# Patient Record
Sex: Male | Born: 1945 | Race: Black or African American | Hispanic: No | Marital: Married | Smoking: Former smoker
Health system: Southern US, Community
[De-identification: ages and names within clinical notes are randomized; demographics above are authoritative.]

## PROBLEM LIST (undated history)

## (undated) DIAGNOSIS — M1990 Unspecified osteoarthritis, unspecified site: Secondary | ICD-10-CM

## (undated) DIAGNOSIS — E113299 Type 2 diabetes mellitus with mild nonproliferative diabetic retinopathy without macular edema, unspecified eye: Secondary | ICD-10-CM

## (undated) DIAGNOSIS — I10 Essential (primary) hypertension: Secondary | ICD-10-CM

## (undated) DIAGNOSIS — E119 Type 2 diabetes mellitus without complications: Secondary | ICD-10-CM

## (undated) DIAGNOSIS — Z9289 Personal history of other medical treatment: Secondary | ICD-10-CM

## (undated) DIAGNOSIS — E7800 Pure hypercholesterolemia, unspecified: Secondary | ICD-10-CM

## (undated) HISTORY — DX: Personal history of other medical treatment: Z92.89

## (undated) HISTORY — PX: CARDIAC CATHETERIZATION: SHX172

## (undated) HISTORY — PX: BACK SURGERY: SHX140

## (undated) HISTORY — PX: CATARACT EXTRACTION: SUR2

## (undated) HISTORY — PX: EYE SURGERY: SHX253

## (undated) HISTORY — PX: WRIST SURGERY: SHX841

## (undated) HISTORY — DX: Type 2 diabetes mellitus with mild nonproliferative diabetic retinopathy without macular edema, unspecified eye: E11.3299

## (undated) HISTORY — PX: LUMBAR DISC SURGERY: SHX700

## (undated) HISTORY — PX: CATARACT EXTRACTION W/ INTRAOCULAR LENS IMPLANT: SHX1309

---

## 1966-08-07 HISTORY — PX: LACERATION REPAIR: SHX5168

## 2016-11-04 ENCOUNTER — Encounter: Payer: Self-pay | Admitting: Family Medicine

## 2017-03-14 ENCOUNTER — Ambulatory Visit (HOSPITAL_COMMUNITY)
Admission: EM | Admit: 2017-03-14 | Discharge: 2017-03-14 | Disposition: A | Discharge status: Home / Self Care | Payer: Medicare Other | Attending: Family Medicine | Admitting: Family Medicine

## 2017-03-14 ENCOUNTER — Encounter (HOSPITAL_COMMUNITY): Payer: Self-pay | Admitting: Emergency Medicine

## 2017-03-14 DIAGNOSIS — L0291 Cutaneous abscess, unspecified: Secondary | ICD-10-CM

## 2017-03-14 HISTORY — DX: Pure hypercholesterolemia, unspecified: E78.00

## 2017-03-14 HISTORY — DX: Essential (primary) hypertension: I10

## 2017-03-14 MED ORDER — SULFAMETHOXAZOLE-TRIMETHOPRIM 800-160 MG PO TABS: 1.0000 | ORAL_TABLET | Freq: Two times a day (BID) | ORAL | 0 refills | Status: AC

## 2017-03-14 MED ORDER — HYDROCODONE-ACETAMINOPHEN 5-325 MG PO TABS: 1.0000 | ORAL_TABLET | ORAL | 0 refills | Status: DC | PRN

## 2017-03-14 NOTE — ED Triage Notes (Signed)
Patient reports an abscess to right, upper back.  No history of this before.  This was noticed a month ago.

## 2017-03-14 NOTE — ED Provider Notes (Signed)
[facility]    CSN: [id number] Arrival date & time: 03/14/17  1847     History   Chief Complaint Chief Complaint  Patient presents with  . Abscess    HPI Caleb Cuevas is a 71 y.o. male.   Patient c/o abscess on his back   The history is provided by the patient and a relative.  Abscess  Abscess location: back. Abscess quality: induration, painful and redness   Red streaking: no   Duration:  3 months Progression:  Worsening Pain details:    Quality:  Throbbing   Severity:  Moderate Chronicity:  Chronic   Past Medical History:  Diagnosis Date  . Diabetes mellitus without complication (HCC)   . High cholesterol   . Hypertension     There are no active problems to display for this patient.   Past Surgical History:  Procedure Laterality Date  . WRIST SURGERY         Home Medications    Prior to Admission medications   Medication Sig Start Date End Date Taking? Authorizing Provider  AMLODIPINE-ATORVASTATIN PO Take by mouth.   Yes [provider]  cholecalciferol (VITAMIN D) 1000 units tablet Take 1,000 Units by mouth daily.   Yes [provider]  insulin aspart (NOVOLOG) 100 UNIT/ML injection Inject into the skin 3 (three) times daily before meals.   Yes [provider]  insulin glargine (LANTUS) 100 UNIT/ML injection Inject into the skin at bedtime.   Yes [provider]  lisinopril-hydrochlorothiazide (PRINZIDE,ZESTORETIC) 20-12.5 MG tablet Take 1 tablet by mouth daily.   Yes [provider]  metFORMIN (GLUCOPHAGE) 1000 MG tablet Take 1,000 mg by mouth 2 (two) times daily with a meal.   Yes [provider]  pravastatin (PRAVACHOL) 40 MG tablet Take 40 mg by mouth daily.   Yes [provider]  HYDROcodone-acetaminophen (NORCO/VICODIN) 5-325 MG tablet Take 1 tablet by mouth every 4 (four) hours as needed. 03/14/17   Deatra Canterxford, Nichael Ehly J, FNP  sulfamethoxazole-trimethoprim (BACTRIM DS,SEPTRA  DS) 800-160 MG tablet Take 1 tablet by mouth 2 (two) times daily. 03/14/17 03/21/17  Deatra Canterxford, Aleric Froelich J, FNP    Family History No family history on file.  Social History Social History  Substance Use Topics  . Smoking status: Never Smoker  . Smokeless tobacco: Not on file  . Alcohol use No     Allergies   Patient has no known allergies.   Review of Systems Review of Systems  Constitutional: Negative.   HENT: Negative.   Eyes: Negative.   Respiratory: Negative.   Cardiovascular: Negative.   Gastrointestinal: Negative.   Endocrine: Negative.   Genitourinary: Negative.   Musculoskeletal: Negative.   Skin: Positive for wound.  Allergic/Immunologic: Negative.   Neurological: Negative.   Hematological: Negative.   Psychiatric/Behavioral: Negative.      Physical Exam Triage Vital Signs ED Triage Vitals  Enc Vitals Group     BP 03/14/17 1920 (!) 154/70     Pulse Rate 03/14/17 1920 86     Resp 03/14/17 1920 20     Temp 03/14/17 1920 98.4 F (36.9 C)     Temp Source 03/14/17 1920 Oral     SpO2 03/14/17 1920 100 %     Weight --      Height --      Head Circumference --      Peak Flow --      Pain Score 03/14/17 1913 8     Pain Loc --  Pain Edu? --      Excl. in GC? --    No data found.   Updated Vital Signs BP (!) 154/70 (BP Location: Left Arm)   Pulse 86   Temp 98.4 F (36.9 C) (Oral)   Resp 20   SpO2 100%   Visual Acuity Right Eye Distance:   Left Eye Distance:   Bilateral Distance:    Right Eye Near:   Left Eye Near:    Bilateral Near:     Physical Exam  Constitutional: He appears well-developed and well-nourished.  HENT:  Head: Normocephalic and atraumatic.  Eyes: Pupils are equal, round, and reactive to light. Conjunctivae and EOM are normal.  Neck: Normal range of motion. Neck supple.  Cardiovascular: Normal rate, regular rhythm and normal heart sounds.   Pulmonary/Chest: Effort normal and breath sounds normal.  Abdominal: Soft. Bowel  sounds are normal.  Skin:  6 cm abscess on back  Nursing note and vitals reviewed.    UC Treatments / Results  Labs (all labs ordered are listed, but only abnormal results are displayed) Labs Reviewed - No data to display  EKG  EKG Interpretation None       Radiology No results found.  Procedures .Marland KitchenIncision and Drainage Date/Time: 03/14/2017 8:15 PM Performed by: Deatra Canter Authorized by: Mardella Layman   Consent:    Consent obtained:  Verbal   Consent given by:  Patient   Risks discussed:  Bleeding, incomplete drainage, pain and infection   Alternatives discussed:  No treatment Location:    Type:  Abscess   Size:  6 cm   Location: back. Pre-procedure details:    Skin preparation:  Betadine Anesthesia (see MAR for exact dosages):    Anesthesia method:  Local infiltration   Local anesthetic:  Lidocaine 1% w/o epi Procedure type:    Complexity:  Simple Procedure details:    Needle aspiration: no     Incision types:  Stab incision   Incision depth:  Subcutaneous   Scalpel blade:  11   Wound management:  Probed and deloculated   Drainage:  Bloody and purulent   Drainage amount:  Copious   Wound treatment:  Wound left open   Packing materials:  1/2 in iodoform gauze   Amount 1/2" iodoform:  24 cm Post-procedure details:    Patient tolerance of procedure:  Tolerated well, no immediate complications Comments:     Bulky 4x4 dressing applied.   (including critical care time)  Medications Ordered in UC Medications - No data to display   Initial Impression / Assessment and Plan / UC Course  I have reviewed the triage vital signs and the nursing notes.  Pertinent labs & imaging results that were available during my care of the patient were reviewed by me and considered in my medical decision making (see chart for details).    Bactrim DS one po bid x 10 days #20 Norco 5/325 one po q 6 hours prn pain #8  Follow up in 2 days for wound  check.    Final Clinical Impressions(s) / UC Diagnoses   Final diagnoses:  Abscess    New Prescriptions Discharge Medication List as of 03/14/2017  8:04 PM    START taking these medications   Details  HYDROcodone-acetaminophen (NORCO/VICODIN) 5-325 MG tablet Take 1 tablet by mouth every 4 (four) hours as needed., Starting Wed 03/14/2017, Print    sulfamethoxazole-trimethoprim (BACTRIM DS,SEPTRA DS) 800-160 MG tablet Take 1 tablet by mouth 2 (two) times daily., Starting  Wed 03/14/2017, Until Wed 03/21/2017, Normal         Controlled Substance Prescriptions [place] Controlled Substance Registry consulted? Yes, I have consulted the [place] Controlled Substances Registry for this patient, and feel the risk/benefit ratio today is favorable for proceeding with this prescription for a controlled substance.   Deatra Canter, [place] 03/14/17 2019

## 2017-03-14 NOTE — Discharge Instructions (Signed)
Follow-up in 2 days for wound check

## 2017-03-16 ENCOUNTER — Encounter (HOSPITAL_COMMUNITY): Payer: Self-pay | Admitting: *Deleted

## 2017-03-16 ENCOUNTER — Ambulatory Visit (HOSPITAL_COMMUNITY)
Admission: EM | Admit: 2017-03-16 | Discharge: 2017-03-16 | Disposition: A | Discharge status: Home / Self Care | Payer: Medicare Other | Attending: Family Medicine | Admitting: Family Medicine

## 2017-03-16 DIAGNOSIS — Z5189 Encounter for other specified aftercare: Secondary | ICD-10-CM

## 2017-03-16 DIAGNOSIS — Z4800 Encounter for change or removal of nonsurgical wound dressing: Secondary | ICD-10-CM

## 2017-03-16 MED ORDER — HYDROCODONE-ACETAMINOPHEN 5-325 MG PO TABS: 1.0000 | ORAL_TABLET | ORAL | 0 refills | Status: DC | PRN

## 2017-03-16 NOTE — ED Provider Notes (Signed)
[facility]    CSN: [id number] Arrival date & time: 03/16/17  1003     History   Chief Complaint Chief Complaint  Patient presents with  . Wound Check    HPI Caleb Cuevas is a 71 y.o. male.   Patient is here for follow up.  He has recently been seen for abscess of back and had packing inserted.  He is here for wound check.   The history is provided by the patient.  Wound Check  This is a new problem. The problem occurs constantly. The problem has not changed since onset.   Past Medical History:  Diagnosis Date  . Diabetes mellitus without complication (HCC)   . High cholesterol   . Hypertension     There are no active problems to display for this patient.   Past Surgical History:  Procedure Laterality Date  . WRIST SURGERY         Home Medications    Prior to Admission medications   Medication Sig Start Date End Date Taking? Authorizing Provider  AMLODIPINE-ATORVASTATIN PO Take by mouth.    [provider]  cholecalciferol (VITAMIN D) 1000 units tablet Take 1,000 Units by mouth daily.    [provider]  HYDROcodone-acetaminophen (NORCO/VICODIN) 5-325 MG tablet Take 1 tablet by mouth every 4 (four) hours as needed. 03/16/17   Deatra Canter, FNP  insulin aspart (NOVOLOG) 100 UNIT/ML injection Inject into the skin 3 (three) times daily before meals.    [provider]  insulin glargine (LANTUS) 100 UNIT/ML injection Inject into the skin at bedtime.    [provider]  lisinopril-hydrochlorothiazide (PRINZIDE,ZESTORETIC) 20-12.5 MG tablet Take 1 tablet by mouth daily.    [provider]  metFORMIN (GLUCOPHAGE) 1000 MG tablet Take 1,000 mg by mouth 2 (two) times daily with a meal.    [provider]  pravastatin (PRAVACHOL) 40 MG tablet Take 40 mg by mouth daily.    [provider]  sulfamethoxazole-trimethoprim (BACTRIM DS,SEPTRA DS) 800-160 MG tablet Take 1 tablet by mouth 2 (two) times  daily. 03/14/17 03/21/17  Deatra Canter, FNP    Family History History reviewed. No pertinent family history.  Social History Social History  Substance Use Topics  . Smoking status: Never Smoker  . Smokeless tobacco: Not on file  . Alcohol use No     Allergies   Patient has no known allergies.   Review of Systems Review of Systems  Constitutional: Negative.   HENT: Negative.   Eyes: Negative.   Respiratory: Negative.   Cardiovascular: Negative.   Gastrointestinal: Negative.   Endocrine: Negative.   Skin: Positive for wound.  Allergic/Immunologic: Negative.   Hematological: Negative.      Physical Exam Triage Vital Signs ED Triage Vitals [03/16/17 1027]  Enc Vitals Group     BP (!) 165/70     Pulse Rate 85     Resp 16     Temp 98.8 F (37.1 C)     Temp Source Oral     SpO2 100 %     Weight      Height      Head Circumference      Peak Flow      Pain Score 4     Pain Loc      Pain Edu?      Excl. in GC?    No data found.   Updated Vital Signs BP (!) 165/70 (BP Location: Right Arm)   Pulse 85  Temp 98.8 F (37.1 C) (Oral)   Resp 16   SpO2 100%   Visual Acuity Right Eye Distance:   Left Eye Distance:   Bilateral Distance:    Right Eye Near:   Left Eye Near:    Bilateral Near:     Physical Exam  Constitutional: He appears well-developed and well-nourished.  HENT:  Head: Normocephalic and atraumatic.  Eyes: Pupils are equal, round, and reactive to light. Conjunctivae and EOM are normal.  Neck: Normal range of motion. Neck supple.  Cardiovascular: Normal rate, regular rhythm and normal heart sounds.   Pulmonary/Chest: Effort normal and breath sounds normal.  Skin:  Wound on back packing removed and replaced.  Serous sanguin drainage removed and pieces of sebum removed. Bulky 4x4 dressing applied   Nursing note and vitals reviewed.    UC Treatments / Results  Labs (all labs ordered are listed, but only abnormal results are  displayed) Labs Reviewed - No data to display  EKG  EKG Interpretation None       Radiology No results found.  Procedures Procedures (including critical care time)  Medications Ordered in UC Medications - No data to display   Initial Impression / Assessment and Plan / UC Course  I have reviewed the triage vital signs and the nursing notes.  Pertinent labs & imaging results that were available during my care of the patient were reviewed by me and considered in my medical decision making (see chart for details).       Final Clinical Impressions(s) / UC Diagnoses   Final diagnoses:  Abscess re-check    New Prescriptions Discharge Medication List as of 03/16/2017 11:26 AM     Norco 5/325 one po q 6 hours prn #8 tablets  Controlled Substance Prescriptions [place] Controlled Substance Registry consulted? Yes, I have consulted the [place] Controlled Substances Registry for this patient, and feel the risk/benefit ratio today is favorable for proceeding with this prescription for a controlled substance.   Deatra CanterOxford, Eisley Barber J, [place] 03/16/17 1156

## 2017-03-16 NOTE — ED Notes (Signed)
Provider has packed abscess and went over discharge instructions.

## 2017-03-16 NOTE — ED Triage Notes (Signed)
Patient here for wound check of abscess that was drained on the 8th. No fevers. Wound still draining at this time. No streaking noted. Patient has been taking antibiotic as prescribed.

## 2017-03-16 NOTE — Discharge Instructions (Signed)
Follow up in 2 days for recheck.

## 2017-03-18 ENCOUNTER — Encounter (HOSPITAL_COMMUNITY): Payer: Self-pay | Admitting: Emergency Medicine

## 2017-03-18 ENCOUNTER — Ambulatory Visit (HOSPITAL_COMMUNITY)
Admission: EM | Admit: 2017-03-18 | Discharge: 2017-03-18 | Disposition: A | Discharge status: Home / Self Care | Payer: Medicare Other | Attending: Family Medicine | Admitting: Family Medicine

## 2017-03-18 DIAGNOSIS — Z5189 Encounter for other specified aftercare: Secondary | ICD-10-CM

## 2017-03-18 NOTE — ED Provider Notes (Signed)
[facility]    CSN: [id number] Arrival date & time: 03/18/17  1210     History   Chief Complaint Chief Complaint  Patient presents with  . Wound Check    HPI Caleb Cuevas is a 71 y.o. male.   71 year old man here for wound recheck on infected sebaceous abscess that was incised and drained 4 days ago, and rechecked 2 days ago.  He's having no worsening symptoms and his daughters helping him change his dressing.      Past Medical History:  Diagnosis Date  . Diabetes mellitus without complication (HCC)   . High cholesterol   . Hypertension     There are no active problems to display for this patient.   Past Surgical History:  Procedure Laterality Date  . WRIST SURGERY         Home Medications    Prior to Admission medications   Medication Sig Start Date End Date Taking? Authorizing Provider  AMLODIPINE-ATORVASTATIN PO Take by mouth.    [provider]  cholecalciferol (VITAMIN D) 1000 units tablet Take 1,000 Units by mouth daily.    [provider]  HYDROcodone-acetaminophen (NORCO/VICODIN) 5-325 MG tablet Take 1 tablet by mouth every 4 (four) hours as needed. 03/16/17   Deatra Canter, FNP  insulin aspart (NOVOLOG) 100 UNIT/ML injection Inject into the skin 3 (three) times daily before meals.    [provider]  insulin glargine (LANTUS) 100 UNIT/ML injection Inject into the skin at bedtime.    [provider]  lisinopril-hydrochlorothiazide (PRINZIDE,ZESTORETIC) 20-12.5 MG tablet Take 1 tablet by mouth daily.    [provider]  metFORMIN (GLUCOPHAGE) 1000 MG tablet Take 1,000 mg by mouth 2 (two) times daily with a meal.    [provider]  pravastatin (PRAVACHOL) 40 MG tablet Take 40 mg by mouth daily.    [provider]  sulfamethoxazole-trimethoprim (BACTRIM DS,SEPTRA DS) 800-160 MG tablet Take 1 tablet by mouth 2 (two) times daily. 03/14/17 03/21/17  Deatra Canter, FNP    Family  History History reviewed. No pertinent family history.  Social History Social History  Substance Use Topics  . Smoking status: Never Smoker  . Smokeless tobacco: Not on file  . Alcohol use No     Allergies   Patient has no known allergies.   Review of Systems Review of Systems  Skin: Positive for wound.  All other systems reviewed and are negative.    Physical Exam Triage Vital Signs ED Triage Vitals  Enc Vitals Group     BP 03/18/17 1306 136/78     Pulse Rate 03/18/17 1306 82     Resp 03/18/17 1306 18     Temp 03/18/17 1306 98.4 F (36.9 C)     Temp Source 03/18/17 1306 Oral     SpO2 03/18/17 1306 97 %     Weight 03/18/17 1307 220 lb (99.8 kg)     Height 03/18/17 1307 5\' 11"  (1.803 m)     Head Circumference --      Peak Flow --      Pain Score 03/18/17 1307 2     Pain Loc --      Pain Edu? --      Excl. in GC? --    No data found.   Updated Vital Signs BP 136/78 (BP Location: Right Arm)   Pulse 82   Temp 98.4 F (36.9 C) (Oral)   Resp 18   Ht 5\' 11"  (1.803 m)  Wt 220 lb (99.8 kg)   SpO2 97%   BMI 30.68 kg/m   Visual Acuity Right Eye Distance:   Left Eye Distance:   Bilateral Distance:    Right Eye Near:   Left Eye Near:    Bilateral Near:     Physical Exam  Constitutional: He appears well-developed and well-nourished.  Skin:  Packing removed and there is no further drainage. The base of the wound appears to be clean. There is no tenderness or fluctuance or induration. Wound edges are clean.  Nursing note and vitals reviewed.    UC Treatments / Results  Labs (all labs ordered are listed, but only abnormal results are displayed) Labs Reviewed - No data to display  EKG  EKG Interpretation None       Radiology No results found.  Procedures Procedures (including critical care time)  Medications Ordered in UC Medications - No data to display   Initial Impression / Assessment and Plan / UC Course  I have reviewed the triage  vital signs and the nursing notes.  Pertinent labs & imaging results that were available during my care of the patient were reviewed by me and considered in my medical decision making (see chart for details).     Final Clinical Impressions(s) / UC Diagnoses   Final diagnoses:  Visit for wound check    New Prescriptions New Prescriptions   No medications on file   Care of wound reviewed with daughter  Controlled Substance Prescriptions [place] Controlled Substance Registry consulted? Not Applicable   Elvina SidleLauenstein, Fidela Cieslak, MD 03/18/17 1317

## 2017-03-18 NOTE — ED Triage Notes (Signed)
Pt here for wound check to upper back; pt had I/D of cyst; pt sts taking antibiotics and packing still in place

## 2017-06-20 ENCOUNTER — Emergency Department
Admission: EM | Admit: 2017-06-20 | Discharge: 2017-06-20 | Disposition: A | Discharge status: Home / Self Care | Payer: Medicare Other | Attending: Emergency Medicine | Admitting: Emergency Medicine

## 2017-12-24 ENCOUNTER — Encounter (HOSPITAL_COMMUNITY): Payer: Self-pay | Admitting: Internal Medicine

## 2017-12-24 ENCOUNTER — Observation Stay (HOSPITAL_COMMUNITY)
Admission: EM | Admit: 2017-12-24 | Discharge: 2017-12-25 | Disposition: A | Discharge status: Home / Self Care | Payer: Medicare Other | Attending: Internal Medicine | Admitting: Internal Medicine

## 2017-12-24 ENCOUNTER — Emergency Department (HOSPITAL_COMMUNITY): Payer: Medicare Other

## 2017-12-24 ENCOUNTER — Other Ambulatory Visit: Payer: Self-pay

## 2017-12-24 DIAGNOSIS — Z79899 Other long term (current) drug therapy: Secondary | ICD-10-CM | POA: Diagnosis not present

## 2017-12-24 DIAGNOSIS — R079 Chest pain, unspecified: Secondary | ICD-10-CM | POA: Diagnosis not present

## 2017-12-24 DIAGNOSIS — E785 Hyperlipidemia, unspecified: Secondary | ICD-10-CM | POA: Insufficient documentation

## 2017-12-24 DIAGNOSIS — I10 Essential (primary) hypertension: Secondary | ICD-10-CM | POA: Diagnosis not present

## 2017-12-24 DIAGNOSIS — E119 Type 2 diabetes mellitus without complications: Secondary | ICD-10-CM | POA: Insufficient documentation

## 2017-12-24 DIAGNOSIS — Z794 Long term (current) use of insulin: Secondary | ICD-10-CM | POA: Insufficient documentation

## 2017-12-24 DIAGNOSIS — I200 Unstable angina: Secondary | ICD-10-CM | POA: Diagnosis present

## 2017-12-24 DIAGNOSIS — Z9889 Other specified postprocedural states: Secondary | ICD-10-CM | POA: Diagnosis not present

## 2017-12-24 DIAGNOSIS — Z87891 Personal history of nicotine dependence: Secondary | ICD-10-CM | POA: Insufficient documentation

## 2017-12-24 DIAGNOSIS — Z8249 Family history of ischemic heart disease and other diseases of the circulatory system: Secondary | ICD-10-CM | POA: Insufficient documentation

## 2017-12-24 DIAGNOSIS — N400 Enlarged prostate without lower urinary tract symptoms: Secondary | ICD-10-CM | POA: Diagnosis not present

## 2017-12-24 DIAGNOSIS — I25110 Atherosclerotic heart disease of native coronary artery with unstable angina pectoris: Principal | ICD-10-CM | POA: Insufficient documentation

## 2017-12-24 DIAGNOSIS — Z7982 Long term (current) use of aspirin: Secondary | ICD-10-CM | POA: Insufficient documentation

## 2017-12-24 DIAGNOSIS — Z833 Family history of diabetes mellitus: Secondary | ICD-10-CM | POA: Diagnosis not present

## 2017-12-24 DIAGNOSIS — F329 Major depressive disorder, single episode, unspecified: Secondary | ICD-10-CM | POA: Diagnosis not present

## 2017-12-24 HISTORY — DX: Unspecified osteoarthritis, unspecified site: M19.90

## 2017-12-24 HISTORY — DX: Type 2 diabetes mellitus without complications: E11.9

## 2017-12-24 LAB — COMPREHENSIVE METABOLIC PANEL
ALBUMIN: 3.6 g/dL (ref 3.5–5.0)
ALK PHOS: 64 U/L (ref 38–126)
ALT: 25 U/L (ref 17–63)
AST: 20 U/L (ref 15–41)
Anion gap: 13 (ref 5–15)
BUN: 21 mg/dL — ABNORMAL HIGH (ref 6–20)
CHLORIDE: 98 mmol/L — AB (ref 101–111)
CO2: 25 mmol/L (ref 22–32)
Calcium: 8.7 mg/dL — ABNORMAL LOW (ref 8.9–10.3)
Creatinine, Ser: 1.54 mg/dL — ABNORMAL HIGH (ref 0.61–1.24)
GFR calc non Af Amer: 44 mL/min — ABNORMAL LOW (ref >60)
GFR, EST AFRICAN AMERICAN: 51 mL/min — AB (ref >60)
GLUCOSE: 208 mg/dL — AB (ref 65–99)
Potassium: 4.3 mmol/L (ref 3.5–5.1)
SODIUM: 136 mmol/L (ref 135–145)
Total Bilirubin: 0.6 mg/dL (ref 0.3–1.2)
Total Protein: 7.1 g/dL (ref 6.5–8.1)

## 2017-12-24 LAB — I-STAT TROPONIN, ED: TROPONIN I, POC: 0.01 ng/mL (ref 0.00–0.08)

## 2017-12-24 LAB — CBC WITH DIFFERENTIAL/PLATELET
Abs Immature Granulocytes: 0 10*3/uL (ref 0.0–0.1)
BASOS ABS: 0 10*3/uL (ref 0.0–0.1)
BASOS PCT: 1 %
Eosinophils Absolute: 0.3 10*3/uL (ref 0.0–0.7)
Eosinophils Relative: 3 %
HCT: 44 % (ref 39.0–52.0)
HEMOGLOBIN: 14.2 g/dL (ref 13.0–17.0)
Immature Granulocytes: 0 %
Lymphocytes Relative: 23 %
Lymphs Abs: 1.8 10*3/uL (ref 0.7–4.0)
MCH: 26.1 pg (ref 26.0–34.0)
MCHC: 32.3 g/dL (ref 30.0–36.0)
MCV: 80.9 fL (ref 78.0–100.0)
MONO ABS: 0.8 10*3/uL (ref 0.1–1.0)
Monocytes Relative: 10 %
Neutro Abs: 4.9 10*3/uL (ref 1.7–7.7)
Neutrophils Relative %: 63 %
PLATELETS: 182 10*3/uL (ref 150–400)
RBC: 5.44 MIL/uL (ref 4.22–5.81)
RDW: 13.4 % (ref 11.5–15.5)
WBC: 7.7 10*3/uL (ref 4.0–10.5)

## 2017-12-24 LAB — D-DIMER, QUANTITATIVE (NOT AT ARMC): D DIMER QUANT: 0.43 ug{FEU}/mL (ref 0.00–0.50)

## 2017-12-24 LAB — BRAIN NATRIURETIC PEPTIDE: B Natriuretic Peptide: 11.1 pg/mL (ref 0.0–100.0)

## 2017-12-24 LAB — TROPONIN I: Troponin I: <0.03 ng/mL (ref <0.03)

## 2017-12-24 LAB — CBG MONITORING, ED: GLUCOSE-CAPILLARY: 186 mg/dL — AB (ref 65–99)

## 2017-12-24 MED ORDER — AMLODIPINE BESYLATE 5 MG PO TABS
5.0000 mg | ORAL_TABLET | Freq: Every day | ORAL | Status: DC
  Administered 2017-12-25: 5 mg via ORAL
  Filled 2017-12-24: qty 1

## 2017-12-24 MED ORDER — NITROGLYCERIN IN D5W 200-5 MCG/ML-% IV SOLN: 0.0000 ug/min | Freq: Once | INTRAVENOUS | Status: DC

## 2017-12-24 MED ORDER — PRAZOSIN HCL 2 MG PO CAPS
5.0000 mg | ORAL_CAPSULE | Freq: Two times a day (BID) | ORAL | Status: DC
  Administered 2017-12-25: 5 mg via ORAL
  Filled 2017-12-24 (×4): qty 1

## 2017-12-24 MED ORDER — ACETAMINOPHEN 325 MG PO TABS: 650.0000 mg | ORAL_TABLET | ORAL | Status: DC | PRN

## 2017-12-24 MED ORDER — ASPIRIN 81 MG PO CHEW: 324.0000 mg | CHEWABLE_TABLET | Freq: Once | ORAL | Status: DC

## 2017-12-24 MED ORDER — GABAPENTIN 300 MG PO CAPS
300.0000 mg | ORAL_CAPSULE | Freq: Two times a day (BID) | ORAL | Status: DC
  Administered 2017-12-25 (×2): 300 mg via ORAL
  Filled 2017-12-24 (×2): qty 1

## 2017-12-24 MED ORDER — ASPIRIN EC 325 MG PO TBEC
325.0000 mg | DELAYED_RELEASE_TABLET | Freq: Every day | ORAL | Status: DC
  Administered 2017-12-25: 325 mg via ORAL
  Filled 2017-12-24: qty 1

## 2017-12-24 MED ORDER — MORPHINE SULFATE (PF) 4 MG/ML IV SOLN
1.0000 mg | INTRAVENOUS | Status: DC | PRN
  Administered 2017-12-25: 1 mg via INTRAVENOUS
  Filled 2017-12-24: qty 1

## 2017-12-24 MED ORDER — ENOXAPARIN SODIUM 40 MG/0.4ML ~~LOC~~ SOLN
40.0000 mg | SUBCUTANEOUS | Status: DC
  Administered 2017-12-25: 40 mg via SUBCUTANEOUS
  Filled 2017-12-24: qty 0.4

## 2017-12-24 MED ORDER — PRAVASTATIN SODIUM 40 MG PO TABS
40.0000 mg | ORAL_TABLET | Freq: Every day | ORAL | Status: DC
  Administered 2017-12-25: 40 mg via ORAL
  Filled 2017-12-24: qty 1

## 2017-12-24 MED ORDER — NITROGLYCERIN 0.4 MG SL SUBL
0.4000 mg | SUBLINGUAL_TABLET | SUBLINGUAL | Status: DC | PRN
  Administered 2017-12-24: 0.4 mg via SUBLINGUAL
  Filled 2017-12-24: qty 1

## 2017-12-24 MED ORDER — FENTANYL CITRATE (PF) 100 MCG/2ML IJ SOLN: 50.0000 ug | Freq: Once | INTRAMUSCULAR | Status: DC

## 2017-12-24 MED ORDER — ONDANSETRON HCL 4 MG/2ML IJ SOLN: 4.0000 mg | Freq: Four times a day (QID) | INTRAMUSCULAR | Status: DC | PRN

## 2017-12-24 MED ORDER — MORPHINE SULFATE (PF) 4 MG/ML IV SOLN
4.0000 mg | Freq: Once | INTRAVENOUS | Status: DC
  Filled 2017-12-24: qty 1

## 2017-12-24 MED ORDER — NITROGLYCERIN IN D5W 200-5 MCG/ML-% IV SOLN
0.0000 ug/min | Freq: Once | INTRAVENOUS | Status: AC
  Administered 2017-12-24: 5 ug/min via INTRAVENOUS
  Filled 2017-12-24: qty 250

## 2017-12-24 MED ORDER — INSULIN ASPART 100 UNIT/ML ~~LOC~~ SOLN
0.0000 [IU] | SUBCUTANEOUS | Status: DC
  Administered 2017-12-25: 2 [IU] via SUBCUTANEOUS
  Administered 2017-12-25: 3 [IU] via SUBCUTANEOUS
  Administered 2017-12-25: 2 [IU] via SUBCUTANEOUS
  Filled 2017-12-24 (×2): qty 1

## 2017-12-24 MED ORDER — SODIUM CHLORIDE 0.9 % IV BOLUS
1000.0000 mL | Freq: Once | INTRAVENOUS | Status: AC
  Administered 2017-12-24: 1000 mL via INTRAVENOUS

## 2017-12-24 MED ORDER — DULOXETINE HCL 60 MG PO CPEP
60.0000 mg | ORAL_CAPSULE | Freq: Two times a day (BID) | ORAL | Status: DC
  Administered 2017-12-25 (×2): 60 mg via ORAL
  Filled 2017-12-24 (×3): qty 1

## 2017-12-24 NOTE — ED Notes (Signed)
Pt states pain now back up to 9/10, pt states pain did respond to nitro enroute to ER. Family at bedside. Will continue to monitor.

## 2017-12-24 NOTE — ED Provider Notes (Signed)
[facility] EMERGENCY DEPARTMENT Provider Note   CSN: [id number] Arrival date & time: 12/24/17  1818     History   Chief Complaint Chief Complaint  Patient presents with  . Chest Pain    HPI Caleb Cuevas is a 72 y.o. male.  HPI   72 yo M with h/o HTN, HLD, DM, CHF here with CP. Pt states that for the last 2 weeks, he's had progressively worsening L-sided CP. CP is aching, throbbing, intermittently severe with associated SOB. It has come and gone for 2 weeks, often at rest, and is getting worse. Today, sx started and got much more severe. It became 10/10. He called EMS and noticed he was SOB, lightheaded. When EMS arrived, he was given a dose of nitro. This decreased his BP from 160/100s to 90/50 but completely resolved his pain. He was given ASA as well. Does not necessarily notice any aggravating factors. No alleviating factors.  Past Medical History:  Diagnosis Date  . Diabetes mellitus without complication (HCC)   . High cholesterol   . Hypertension     There are no active problems to display for this patient.   Past Surgical History:  Procedure Laterality Date  . WRIST SURGERY          Home Medications    Prior to Admission medications   Medication Sig Start Date End Date Taking? Authorizing Provider  amLODipine (NORVASC) 10 MG tablet Take 5 mg by mouth daily.   Yes [provider]  aspirin EC 81 MG tablet Take 81 mg by mouth daily.   Yes [provider]  DULoxetine (CYMBALTA) 60 MG capsule Take 60 mg by mouth 2 (two) times daily.   Yes [provider]  ergocalciferol (VITAMIN D2) 50000 units capsule Take 50,000 Units by mouth every Sunday.   Yes [provider]  furosemide (LASIX) 20 MG tablet Take 20 mg by mouth daily.   Yes [provider]  gabapentin (NEURONTIN) 300 MG capsule Take 300 mg by mouth 2 (two) times daily.   Yes [provider]  insulin aspart (NOVOLOG FLEXPEN) 100 UNIT/ML  FlexPen Inject 17 Units into the skin 3 (three) times daily with meals.   Yes [provider]  insulin glargine (LANTUS) 100 unit/mL SOPN Inject 64 Units into the skin at bedtime.   Yes [provider]  Meclizine HCl 25 MG CHEW Chew 25 mg by mouth 2 (two) times daily as needed (vertigo).   Yes [provider]  metFORMIN (GLUCOPHAGE) 1000 MG tablet Take 500 mg by mouth 2 (two) times daily with a meal.    Yes [provider]  naproxen sodium (ALEVE) 220 MG tablet Take 440 mg by mouth daily as needed (pain/headache).   Yes [provider]  potassium chloride (KLOR-CON) 8 MEQ tablet Take 8 mEq by mouth daily.   Yes [provider]  pravastatin (PRAVACHOL) 40 MG tablet Take 40 mg by mouth at bedtime.    Yes [provider]  prazosin (MINIPRESS) 5 MG capsule Take 5 mg by mouth 2 (two) times daily.   Yes [provider]  HYDROcodone-acetaminophen (NORCO/VICODIN) 5-325 MG tablet Take 1 tablet by mouth every 4 (four) hours as needed. Patient not taking: Reported on 12/24/2017 03/16/17   Deatra Canter, FNP    Family History No family history on file.  Social History Social History   Tobacco Use  . Smoking status: Never Smoker  Substance Use Topics  . Alcohol use: No  .  Drug use: No     Allergies   Patient has no known allergies.   Review of Systems Review of Systems  Constitutional: Positive for fatigue. Negative for chills and fever.  HENT: Negative for congestion and rhinorrhea.   Eyes: Negative for visual disturbance.  Respiratory: Positive for chest tightness and shortness of breath. Negative for cough and wheezing.   Cardiovascular: Positive for chest pain. Negative for leg swelling.  Gastrointestinal: Negative for abdominal pain, diarrhea, nausea and vomiting.  Genitourinary: Negative for dysuria and flank pain.  Musculoskeletal: Negative for neck pain and neck stiffness.  Skin: Negative for rash and wound.    Allergic/Immunologic: Negative for immunocompromised state.  Neurological: Negative for syncope, weakness and headaches.  All other systems reviewed and are negative.    Physical Exam Updated Vital Signs BP (!) 149/76   Pulse 93   Temp 98.2 F (36.8 C) (Oral)   Resp 15   Ht 6' (1.829 m)   Wt 98.9 kg (218 lb)   SpO2 96%   BMI 29.57 kg/m   Physical Exam  Constitutional: He is oriented to person, place, and time. He appears well-developed and well-nourished. No distress.  HENT:  Head: Normocephalic and atraumatic.  Eyes: Conjunctivae are normal.  Neck: Neck supple.  Cardiovascular: Regular rhythm and normal heart sounds. Tachycardia present. Exam reveals no friction rub.  No murmur heard. Pulmonary/Chest: Effort normal and breath sounds normal. No respiratory distress. He has no wheezes. He has no rales.  No chest wall TTP  Abdominal: He exhibits no distension.  Musculoskeletal: He exhibits no edema.  Neurological: He is alert and oriented to person, place, and time. He exhibits normal muscle tone.  Skin: Skin is warm. Capillary refill takes less than 2 seconds.  Psychiatric: He has a normal mood and affect.  Nursing note and vitals reviewed.    ED Treatments / Results  Labs (all labs ordered are listed, but only abnormal results are displayed) Labs Reviewed  COMPREHENSIVE METABOLIC PANEL - Abnormal; Notable for the following components:      Result Value   Chloride 98 (*)    Glucose, Bld 208 (*)    BUN 21 (*)    Creatinine, Ser 1.54 (*)    Calcium 8.7 (*)    GFR calc non Af Amer 44 (*)    GFR calc Af Amer 51 (*)    All other components within normal limits  CBG MONITORING, ED - Abnormal; Notable for the following components:   Glucose-Capillary 186 (*)    All other components within normal limits  CBC WITH DIFFERENTIAL/PLATELET  BRAIN NATRIURETIC PEPTIDE  TROPONIN I  D-DIMER, QUANTITATIVE (NOT AT [facility])  RAPID URINE DRUG SCREEN, HOSP PERFORMED  I-STAT  TROPONIN, ED    EKG EKG Interpretation  Date/Time:  Monday Dec 24 2017 18:22:25 EDT Ventricular Rate:  103 PR Interval:    QRS Duration: 66 QT Interval:  321 QTC Calculation: 421 R Axis:   21 Text Interpretation:  Sinus tachycardia Probable left atrial enlargement Borderline T wave abnormalities Baseline wander in lead(s) I II aVR aVF V2 V6 No old tracing to compare Confirmed by Shaune Pollack [phone]) on 12/24/2017 6:36:32 PM   Radiology Dg Chest 2 View  Result Date: 12/24/2017 CLINICAL DATA:  Chest pain and shortness of breath. EXAM: CHEST - 2 VIEW COMPARISON:  None. FINDINGS: Lateral view degraded by patient arm position. Midline trachea. Normal heart size and mediastinal contours. No pleural effusion or pneumothorax. Mildly low lung volumes. Mild subsegmental atelectasis  at the bases. IMPRESSION: Low lung volumes, without acute disease. Electronically Signed   By: Jeronimo Greaves M.D.   On: 12/24/2017 19:28    Procedures .Critical Care Performed by: Shaune Pollack, MD Authorized by: Shaune Pollack, MD   Critical care provider statement:    Critical care time (minutes):  45   Critical care time was exclusive of:  Separately billable procedures and treating other patients and teaching time   Critical care was necessary to treat or prevent imminent or life-threatening deterioration of the following conditions:  Circulatory failure and cardiac failure   Critical care was time spent personally by me on the following activities:  Development of treatment plan with patient or surrogate, discussions with consultants, evaluation of patient's response to treatment, examination of patient, obtaining history from patient or surrogate, ordering and performing treatments and interventions, ordering and review of laboratory studies, ordering and review of radiographic studies, pulse oximetry, re-evaluation of patient's condition and review of old charts   I assumed direction of critical care for  this patient from another provider in my specialty: no     (including critical care time)  Medications Ordered in ED Medications  nitroGLYCERIN (NITROSTAT) SL tablet 0.4 mg (0.4 mg Sublingual Given 12/24/17 1941)  sodium chloride 0.9 % bolus 1,000 mL (1,000 mLs Intravenous New Bag/Given 12/24/17 2152)  nitroGLYCERIN 50 mg in dextrose 5 % 250 mL (0.2 mg/mL) infusion (5 mcg/min Intravenous New Bag/Given 12/24/17 2153)     Initial Impression / Assessment and Plan / ED Course  I have reviewed the triage vital signs and the nursing notes.  Pertinent labs & imaging results that were available during my care of the patient were reviewed by me and considered in my medical decision making (see chart for details).    72 yo M with PMHx as above here with ongoing chest pain. HEART score is 5, pt mildly tachycardic on arrival but with neg trop, neg D-Dimer- doubt dissection, PE. Concern for ongoing angina given excellent response to nitro. Discussed with Cardiology fellow on-call - recommends med admission, will consult urgently if trop is positive; if not, recommends Cards c/s in AM for Lexiscan.  Final Clinical Impressions(s) / ED Diagnoses   Final diagnoses:  Chest pain, unspecified type    ED Discharge Orders    None       Shaune Pollack, MD 12/24/17 2206

## 2017-12-24 NOTE — H&P (Signed)
History and Physical    Caleb Cuevas ZOX:[id number] DOB: 15-Oct-1945 DOA: 12/24/2017  PCP: System, Pcp Not In  Patient coming from: Home.  Chief Complaint: Chest pain.  HPI: Caleb Cuevas is a 72 y.o. male with history of diabetes mellitus type 2, hypertension, hyperlipidemia BPH, probable chronic kidney disease presents to the ER with complaint of chest pain.  Patient states he has been having chest pain off and on left-sided pressure-like over the last 2 weeks which has been progressively worsening.  Today the pain became more intense and more frequent than usual.  Denies any associated shortness of breath abdominal pain nausea vomiting diarrhea productive cough fever or chills.  ED Course: In the ER patient's chest pain improved initially with sublingual nitroglycerin but has started to recur and was placed on nitroglycerin infusion.  EKG shows normal sinus rhythm troponins were negative.  Patient admitted for further management of chest pain concerning for angina.  ER physician I discussed with on-call cardiologist.  Review of Systems: As per HPI, rest all negative.   Past Medical History:  Diagnosis Date  . Diabetes mellitus without complication (HCC)   . High cholesterol   . Hypertension     Past Surgical History:  Procedure Laterality Date  . BACK SURGERY    . WRIST SURGERY       reports that he has quit smoking. He has never used smokeless tobacco. He reports that he does not drink alcohol or use drugs.  No Known Allergies  Family History  Problem Relation Age of Onset  . Diabetes Mellitus II Mother   . CAD Neg Hx     Prior to Admission medications   Medication Sig Start Date End Date Taking? Authorizing Provider  amLODipine (NORVASC) 10 MG tablet Take 5 mg by mouth daily.   Yes [provider]  aspirin EC 81 MG tablet Take 81 mg by mouth daily.   Yes [provider]  DULoxetine (CYMBALTA) 60 MG capsule Take 60 mg by mouth 2 (two) times daily.    Yes [provider]  ergocalciferol (VITAMIN D2) 50000 units capsule Take 50,000 Units by mouth every Sunday.   Yes [provider]  furosemide (LASIX) 20 MG tablet Take 20 mg by mouth daily.   Yes [provider]  gabapentin (NEURONTIN) 300 MG capsule Take 300 mg by mouth 2 (two) times daily.   Yes [provider]  insulin aspart (NOVOLOG FLEXPEN) 100 UNIT/ML FlexPen Inject 17 Units into the skin 3 (three) times daily with meals.   Yes [provider]  insulin glargine (LANTUS) 100 unit/mL SOPN Inject 64 Units into the skin at bedtime.   Yes [provider]  Meclizine HCl 25 MG CHEW Chew 25 mg by mouth 2 (two) times daily as needed (vertigo).   Yes [provider]  metFORMIN (GLUCOPHAGE) 1000 MG tablet Take 500 mg by mouth 2 (two) times daily with a meal.    Yes [provider]  naproxen sodium (ALEVE) 220 MG tablet Take 440 mg by mouth daily as needed (pain/headache).   Yes [provider]  potassium chloride (KLOR-CON) 8 MEQ tablet Take 8 mEq by mouth daily.   Yes [provider]  pravastatin (PRAVACHOL) 40 MG tablet Take 40 mg by mouth at bedtime.    Yes [provider]  prazosin (MINIPRESS) 5 MG capsule Take 5 mg by mouth 2 (two) times daily.   Yes [provider]  HYDROcodone-acetaminophen (NORCO/VICODIN) 5-325 MG tablet Take 1  tablet by mouth every 4 (four) hours as needed. Patient not taking: Reported on 12/24/2017 03/16/17   Deatra Canter, FNP    Physical Exam: Vitals:   12/24/17 2215 12/24/17 2230 12/24/17 2245 12/24/17 2300  BP: 135/73 139/79 (!) 154/77 (!) 143/76  Pulse: 88 88 89 89  Resp: Temp:      TempSrc:      SpO2: 98% 97% 99% 99%  Weight:      Height:          Constitutional: Moderately built and nourished. Vitals:   12/24/17 2215 12/24/17 2230 12/24/17 2245 12/24/17 2300  BP: 135/73 139/79 (!) 154/77 (!) 143/76  Pulse: 88 88 89 89  Resp: Temp:      TempSrc:      SpO2: 98% 97% 99% 99%  Weight:      Height:       Eyes: Anicteric no pallor. ENMT: No discharge from the ears eyes nose or mouth. Neck: No mass felt.  No neck rigidity.  No JVD appreciated. Respiratory: No rhonchi or crepitations. Cardiovascular: S1-S2 heard no murmurs appreciated.  Abdomen: Soft nontender bowel sounds present.  No guarding or rigidity. Musculoskeletal: No edema.  No joint effusion. Skin: No rash. Neurologic: Alert awake oriented to time place and person.  Moves all extremities. Psychiatric: Appears normal.  Normal affect.   Labs on Admission: I have personally reviewed following labs and imaging studies  CBC: Recent Labs  Lab 12/24/17 1847  WBC 7.7  NEUTROABS 4.9  HGB 14.2  HCT 44.0  MCV 80.9  PLT 182   Basic Metabolic Panel: Recent Labs  Lab 12/24/17 1847  NA 136  K 4.3  CL 98*  CO2 25  GLUCOSE 208*  BUN 21*  CREATININE 1.54*  CALCIUM 8.7*   GFR: Estimated Creatinine Clearance: 53.6 mL/min (A) (by C-G formula based on SCr of 1.54 mg/dL (H)). Liver Function Tests: Recent Labs  Lab 12/24/17 1847  AST 20  ALT 25  ALKPHOS 64  BILITOT 0.6  PROT 7.1  ALBUMIN 3.6   No results for input(s): LIPASE, AMYLASE in the last 168 hours. No results for input(s): AMMONIA in the last 168 hours. Coagulation Profile: No results for input(s): INR, PROTIME in the last 168 hours. Cardiac Enzymes: Recent Labs  Lab 12/24/17 1847  TROPONINI <0.03   BNP (last 3 results) No results for input(s): PROBNP in the last 8760 hours. HbA1C: No results for input(s): HGBA1C in the last 72 hours. CBG: Recent Labs  Lab 12/24/17 2021  GLUCAP 186*   Lipid Profile: No results for input(s): CHOL, HDL, LDLCALC, TRIG, CHOLHDL, LDLDIRECT in the last 72 hours. Thyroid Function Tests: No results for input(s): TSH, T4TOTAL, FREET4, T3FREE, THYROIDAB in the last 72 hours. Anemia Panel: No results for input(s): VITAMINB12, FOLATE,  FERRITIN, TIBC, IRON, RETICCTPCT in the last 72 hours. Urine analysis: No results found for: COLORURINE, APPEARANCEUR, LABSPEC, PHURINE, GLUCOSEU, HGBUR, BILIRUBINUR, KETONESUR, PROTEINUR, UROBILINOGEN, NITRITE, LEUKOCYTESUR Sepsis Labs: (procalcitonin:4,lacticidven:4) )No results found for this or any previous visit (from the past 240 hour(s)).   Radiological Exams on Admission: Dg Chest 2 View  Result Date: 12/24/2017 CLINICAL DATA:  Chest pain and shortness of breath. EXAM: CHEST - 2 VIEW COMPARISON:  None. FINDINGS: Lateral view degraded by patient arm position. Midline trachea. Normal heart size and mediastinal contours. No pleural effusion or pneumothorax. Mildly low lung volumes. Mild subsegmental atelectasis at the bases. IMPRESSION: Low lung volumes,  without acute disease. Electronically Signed   By: Jeronimo Greaves M.D.   On: 12/24/2017 19:28    EKG: Independently reviewed.  Normal sinus rhythm.  Assessment/Plan Principal Problem:   Chest pain Active Problems:   Diabetes mellitus type 2 in nonobese [facility])   Essential hypertension   HLD (hyperlipidemia)    1. Chest pain -concerning for angina.  Patient is placed on nitroglycerin infusion.  Patient already takes statins placed on aspirin.  We will cycle cardiac markers check 2D echo.  Cardiology notified patient will be kept n.p.o. in anticipation of possible cardiac procedure. 2. Hypertension on amlodipine.  Patient also takes prazosin probably for BPH. 3. Diabetes mellitus type 2 takes Lantus 64 units along with pre-meal NovoLog.  Closely follow CBGs.  I am going to decrease patient's Lantus insulin based on his CBGs. 4. Hyperlipidemia on statins. 5. Probable chronic kidney disease stage III.  No labs to compare.  Follow metabolic panel. 6. History of depression on Cymbalta. 7. Patient takes Lasix and echocardiogram to compare.   DVT prophylaxis: Lovenox. Code Status: Full code. Family Communication: Patient's  wife. Disposition Plan: Home. Consults called: Cardiology. Admission status: Observation.   Eduard Clos MD [facility] Pager [phone].  If 7PM-7AM, please contact night-coverage [email or web address] Password [email or web address]  12/24/2017, 11:17 PM

## 2017-12-24 NOTE — ED Triage Notes (Signed)
Pt arrives from home, C/o CP 2weeks, began again this AM at approx 0830, reached 10/10 this afternoon, L sided radiating to the back, L arm numbness, SOB, denies n/v/d, syncope Received 324 asprin and 1 Nitro via [facility] en route, Nitro dropped pressure to 98/50 but releived some pain. Pressure retaken by [facility] to be 115/70  Pain 6/10 on arrival  IV: 20 L Hand

## 2017-12-25 ENCOUNTER — Ambulatory Visit (HOSPITAL_COMMUNITY)
Admission: EM | Disposition: A | Discharge status: Home / Self Care | Payer: Self-pay | Source: Home / Self Care | Attending: Internal Medicine

## 2017-12-25 ENCOUNTER — Inpatient Hospital Stay (HOSPITAL_COMMUNITY): Payer: Medicare Other

## 2017-12-25 ENCOUNTER — Encounter (HOSPITAL_COMMUNITY): Payer: Self-pay | Admitting: Physician Assistant

## 2017-12-25 DIAGNOSIS — E7800 Pure hypercholesterolemia, unspecified: Secondary | ICD-10-CM

## 2017-12-25 DIAGNOSIS — I10 Essential (primary) hypertension: Secondary | ICD-10-CM | POA: Diagnosis not present

## 2017-12-25 DIAGNOSIS — I200 Unstable angina: Secondary | ICD-10-CM

## 2017-12-25 HISTORY — PX: LEFT HEART CATH AND CORONARY ANGIOGRAPHY: CATH118249

## 2017-12-25 HISTORY — PX: CARDIAC CATHETERIZATION: SHX172

## 2017-12-25 LAB — CBG MONITORING, ED
GLUCOSE-CAPILLARY: 176 mg/dL — AB (ref 65–99)
GLUCOSE-CAPILLARY: 187 mg/dL — AB (ref 65–99)
GLUCOSE-CAPILLARY: 210 mg/dL — AB (ref 65–99)
Glucose-Capillary: 136 mg/dL — ABNORMAL HIGH (ref 65–99)
Glucose-Capillary: 207 mg/dL — ABNORMAL HIGH (ref 65–99)

## 2017-12-25 LAB — TROPONIN I: Troponin I: <0.03 ng/mL (ref <0.03)

## 2017-12-25 LAB — RAPID URINE DRUG SCREEN, HOSP PERFORMED
Amphetamines: NOT DETECTED
Barbiturates: NOT DETECTED
Benzodiazepines: NOT DETECTED
COCAINE: NOT DETECTED
OPIATES: NOT DETECTED
TETRAHYDROCANNABINOL: NOT DETECTED

## 2017-12-25 LAB — LIPASE, BLOOD: Lipase: 25 U/L (ref 11–51)

## 2017-12-25 LAB — GLUCOSE, CAPILLARY: GLUCOSE-CAPILLARY: 182 mg/dL — AB (ref 65–99)

## 2017-12-25 SURGERY — LEFT HEART CATH AND CORONARY ANGIOGRAPHY
Anesthesia: LOCAL

## 2017-12-25 MED ORDER — LIDOCAINE HCL (PF) 1 % IJ SOLN
INTRAMUSCULAR | Status: AC
  Filled 2017-12-25: qty 30

## 2017-12-25 MED ORDER — NITROGLYCERIN 2 % TD OINT
0.5000 [in_us] | TOPICAL_OINTMENT | Freq: Four times a day (QID) | TRANSDERMAL | Status: DC
  Filled 2017-12-25 (×2): qty 30

## 2017-12-25 MED ORDER — SODIUM CHLORIDE 0.9% FLUSH: 3.0000 mL | INTRAVENOUS | Status: DC | PRN

## 2017-12-25 MED ORDER — FENTANYL CITRATE (PF) 100 MCG/2ML IJ SOLN
INTRAMUSCULAR | Status: AC
  Filled 2017-12-25: qty 2

## 2017-12-25 MED ORDER — ACETAMINOPHEN 325 MG PO TABS: 650.0000 mg | ORAL_TABLET | ORAL | Status: DC | PRN

## 2017-12-25 MED ORDER — FENTANYL CITRATE (PF) 100 MCG/2ML IJ SOLN
INTRAMUSCULAR | Status: DC | PRN
  Administered 2017-12-25: 25 ug via INTRAVENOUS

## 2017-12-25 MED ORDER — HEPARIN (PORCINE) IN NACL 2-0.9 UNITS/ML
INTRAMUSCULAR | Status: AC | PRN
  Administered 2017-12-25 (×2): 500 mL

## 2017-12-25 MED ORDER — ASPIRIN 81 MG PO CHEW: 81.0000 mg | CHEWABLE_TABLET | Freq: Every day | ORAL | Status: DC

## 2017-12-25 MED ORDER — IOPAMIDOL (ISOVUE-370) INJECTION 76%
INTRAVENOUS | Status: AC
  Filled 2017-12-25: qty 100

## 2017-12-25 MED ORDER — SODIUM CHLORIDE 0.9 % IV SOLN
INTRAVENOUS | Status: AC | PRN
  Administered 2017-12-25: 250 mL
  Administered 2017-12-25: 100 mL/h via INTRAVENOUS

## 2017-12-25 MED ORDER — HEPARIN (PORCINE) IN NACL 2-0.9 UNITS/ML
INTRAMUSCULAR | Status: DC | PRN
  Administered 2017-12-25: 10 mL via INTRA_ARTERIAL

## 2017-12-25 MED ORDER — HEPARIN (PORCINE) IN NACL 1000-0.9 UT/500ML-% IV SOLN
INTRAVENOUS | Status: AC
  Filled 2017-12-25: qty 1000

## 2017-12-25 MED ORDER — HEPARIN SODIUM (PORCINE) 1000 UNIT/ML IJ SOLN
INTRAMUSCULAR | Status: AC
  Filled 2017-12-25: qty 1

## 2017-12-25 MED ORDER — ONDANSETRON HCL 4 MG/2ML IJ SOLN: 4.0000 mg | Freq: Four times a day (QID) | INTRAMUSCULAR | Status: DC | PRN

## 2017-12-25 MED ORDER — SODIUM CHLORIDE 0.9% FLUSH: 3.0000 mL | Freq: Two times a day (BID) | INTRAVENOUS | Status: DC

## 2017-12-25 MED ORDER — ENSURE ENLIVE PO LIQD: 237.0000 mL | Freq: Two times a day (BID) | ORAL | Status: DC

## 2017-12-25 MED ORDER — MIDAZOLAM HCL 2 MG/2ML IJ SOLN
INTRAMUSCULAR | Status: DC | PRN
  Administered 2017-12-25: 2 mg via INTRAVENOUS

## 2017-12-25 MED ORDER — LIDOCAINE HCL (PF) 1 % IJ SOLN
INTRAMUSCULAR | Status: DC | PRN
  Administered 2017-12-25: 2 mL

## 2017-12-25 MED ORDER — SODIUM CHLORIDE 0.9 % IV SOLN: INTRAVENOUS | Status: AC

## 2017-12-25 MED ORDER — HEPARIN SODIUM (PORCINE) 1000 UNIT/ML IJ SOLN
INTRAMUSCULAR | Status: DC | PRN
  Administered 2017-12-25: 5000 [IU] via INTRAVENOUS

## 2017-12-25 MED ORDER — IOPAMIDOL (ISOVUE-370) INJECTION 76%
INTRAVENOUS | Status: DC | PRN
  Administered 2017-12-25: 40 mL

## 2017-12-25 MED ORDER — VERAPAMIL HCL 2.5 MG/ML IV SOLN
INTRAVENOUS | Status: AC
  Filled 2017-12-25: qty 2

## 2017-12-25 MED ORDER — PHENYLEPHRINE HCL 10 MG/ML IJ SOLN
INTRAMUSCULAR | Status: AC
  Filled 2017-12-25: qty 1

## 2017-12-25 MED ORDER — SODIUM CHLORIDE 0.9 % IV SOLN: 250.0000 mL | INTRAVENOUS | Status: DC | PRN

## 2017-12-25 MED ORDER — MIDAZOLAM HCL 2 MG/2ML IJ SOLN
INTRAMUSCULAR | Status: AC
  Filled 2017-12-25: qty 2

## 2017-12-25 SURGICAL SUPPLY — 13 items
CATH IMPULSE 5F ANG/FL3.5 (CATHETERS) ×2 IMPLANT
COVER PRB 48X5XTLSCP FOLD TPE (BAG) ×1 IMPLANT
COVER PROBE 5X48 (BAG) ×1
DEVICE RAD COMP TR BAND LRG (VASCULAR PRODUCTS) ×2 IMPLANT
ELECT DEFIB PAD ADLT CADENCE (PAD) ×2 IMPLANT
GUIDEWIRE INQWIRE 1.5J.035X260 (WIRE) ×1 IMPLANT
INQWIRE 1.5J .035X260CM (WIRE) ×2
KIT HEART LEFT (KITS) ×2 IMPLANT
NEEDLE PERC 21GX4CM (NEEDLE) ×2 IMPLANT
PACK CARDIAC CATHETERIZATION (CUSTOM PROCEDURE TRAY) ×2 IMPLANT
SHEATH RAIN RADIAL 21G 6FR (SHEATH) ×2 IMPLANT
TRANSDUCER W/STOPCOCK (MISCELLANEOUS) ×2 IMPLANT
TUBING CIL FLEX 10 FLL-RA (TUBING) ×2 IMPLANT

## 2017-12-25 NOTE — ED Notes (Signed)
Rounded on patient, states his pain level is back up to 7/10. Increased nitro drip at this time. Pt AOX4. No acute distress noted.

## 2017-12-25 NOTE — Care Management Obs Status (Signed)
MEDICARE OBSERVATION STATUS NOTIFICATION   Patient Details  Name: Caleb Cuevas MRN: [id number] Date of Birth: 07-Feb-1946   Medicare Observation Status Notification Given:  Yes    Lawerance Sabal, RN 12/25/2017, 4:49 PM

## 2017-12-25 NOTE — Discharge Summary (Signed)
Physician Discharge Summary  Caleb Cuevas MRN: [id number] DOB/AGE: 1946-05-09 72 y.o.  PCP: System, Pcp Not In   Admit date: 12/24/2017 Discharge date: 12/25/2017  Discharge Diagnoses:    Principal Problem:   Chest pain Active Problems:   Diabetes mellitus type 2 in nonobese [facility])   Essential hypertension   HLD (hyperlipidemia)   Unstable angina ([place])    Follow-up recommendations Follow-up with PCP in 3-5 days , including all  additional recommended appointments as below Follow-up CBC, CMP in 3-5 days       Allergies as of 12/25/2017   No Known Allergies     Medication List    STOP taking these medications   metFORMIN 1000 MG tablet Commonly known as:  GLUCOPHAGE   naproxen sodium 220 MG tablet Commonly known as:  ALEVE     TAKE these medications   amLODipine 10 MG tablet Commonly known as:  NORVASC Take 5 mg by mouth daily.   aspirin EC 81 MG tablet Take 81 mg by mouth daily.   DULoxetine 60 MG capsule Commonly known as:  CYMBALTA Take 60 mg by mouth 2 (two) times daily.   ergocalciferol 50000 units capsule Commonly known as:  VITAMIN D2 Take 50,000 Units by mouth every Sunday.   furosemide 20 MG tablet Commonly known as:  LASIX Take 20 mg by mouth daily.   gabapentin 300 MG capsule Commonly known as:  NEURONTIN Take 300 mg by mouth 2 (two) times daily.   HYDROcodone-acetaminophen 5-325 MG tablet Commonly known as:  NORCO/VICODIN Take 1 tablet by mouth every 4 (four) hours as needed.   insulin glargine 100 unit/mL Sopn Commonly known as:  LANTUS Inject 64 Units into the skin at bedtime.   Meclizine HCl 25 MG Chew Chew 25 mg by mouth 2 (two) times daily as needed (vertigo).   NOVOLOG FLEXPEN 100 UNIT/ML FlexPen Generic drug:  insulin aspart Inject 17 Units into the skin 3 (three) times daily with meals.   potassium chloride 8 MEQ tablet Commonly known as:  KLOR-CON Take 8 mEq by mouth daily.   pravastatin 40 MG tablet Commonly known  as:  PRAVACHOL Take 40 mg by mouth at bedtime.   prazosin 5 MG capsule Commonly known as:  MINIPRESS Take 5 mg by mouth 2 (two) times daily.        Discharge Condition:   Discharge Instructions Get Medicines reviewed and adjusted: Please take all your medications with you for your next visit with your Primary MD  Please request your Primary MD to go over all hospital tests and procedure/radiological results at the follow up, please ask your Primary MD to get all Hospital records sent to his/her office.  If you experience worsening of your admission symptoms, develop shortness of breath, life threatening emergency, suicidal or homicidal thoughts you must seek medical attention immediately by calling 911 or calling your MD immediately  if symptoms less severe.  You must read complete instructions/literature along with all the possible adverse reactions/side effects for all the Medicines you take and that have been prescribed to you. Take any new Medicines after you have completely understood and accpet all the possible adverse reactions/side effects.   Do not drive when taking Pain medications.   Do not take more than prescribed Pain, Sleep and Anxiety Medications  Special Instructions: If you have smoked or chewed Tobacco  in the last 2 yrs please stop smoking, stop any regular Alcohol  and or any Recreational drug use.  Wear Seat belts while  driving.  Please note  You were cared for by a hospitalist during your hospital stay. Once you are discharged, your primary care physician will handle any further medical issues. Please note that NO REFILLS for any discharge medications will be authorized once you are discharged, as it is imperative that you return to your primary care physician (or establish a relationship with a primary care physician if you do not have one) for your aftercare needs so that they can reassess your need for medications and monitor your lab values.     No  Known Allergies    Disposition: Discharge disposition: 01-Home or Self Care        Consults:  cardiology  Procedures   LEFT HEART CATH AND CORONARY ANGIOGRAPHY  Conclusion     Mid RCA lesion is 25% stenosed.  Mid LAD lesion is 25% stenosed.  The left ventricular ejection fraction is greater than 65% by visual estimate.  There is hyperdynamic left ventricular systolic function.  LV end diastolic pressure is normal.  There is no aortic valve stenosis.  Likely LVH based on ventriculogram.   Nonobstructive, mild CAD.  Continue preventive therapy     Significant Diagnostic Studies:  Dg Chest 2 View  Result Date: 12/24/2017 CLINICAL DATA:  Chest pain and shortness of breath. EXAM: CHEST - 2 VIEW COMPARISON:  None. FINDINGS: Lateral view degraded by patient arm position. Midline trachea. Normal heart size and mediastinal contours. No pleural effusion or pneumothorax. Mildly low lung volumes. Mild subsegmental atelectasis at the bases. IMPRESSION: Low lung volumes, without acute disease. Electronically Signed   By: Abigail Miyamoto M.D.   On: 12/24/2017 19:28       Filed Weights   12/24/17 1824  Weight: 98.9 kg (218 lb)     Microbiology: No results found for this or any previous visit (from the past 240 hour(s)).     Blood Culture No results found for: SDES, [place], CULT, REPTSTATUS    Labs: Results for orders placed or performed during the hospital encounter of 12/24/17 (from the past 48 hour(s))  Brain natriuretic peptide     Status: None   Collection Time: 12/24/17  6:20 PM  Result Value Ref Range   B Natriuretic Peptide 11.1 0.0 - 100.0 pg/mL    Comment: Performed at [facility], [address] [place],  [address]  CBC with Differential     Status: None   Collection Time: 12/24/17  6:47 PM  Result Value Ref Range   WBC 7.7 4.0 - 10.5 K/uL   RBC 5.44 4.22 - 5.81 MIL/uL   Hemoglobin 14.2 13.0 - 17.0 g/dL   HCT 44.0 39.0 - 52.0 %    MCV 80.9 78.0 - 100.0 fL   MCH 26.1 26.0 - 34.0 pg   MCHC 32.3 30.0 - 36.0 g/dL   RDW 13.4 11.5 - 15.5 %   Platelets 182 150 - 400 K/uL   Neutrophils Relative % 63 %   Neutro Abs 4.9 1.7 - 7.7 K/uL   Lymphocytes Relative 23 %   Lymphs Abs 1.8 0.7 - 4.0 K/uL   Monocytes Relative 10 %   Monocytes Absolute 0.8 0.1 - 1.0 K/uL   Eosinophils Relative 3 %   Eosinophils Absolute 0.3 0.0 - 0.7 K/uL   Basophils Relative 1 %   Basophils Absolute 0.0 0.0 - 0.1 K/uL   Immature Granulocytes 0 %   Abs Immature Granulocytes 0.0 0.0 - 0.1 K/uL    Comment: Performed at [facility]  [facility], [place] [address], [place], [place] [address]  Comprehensive metabolic panel     Status: Abnormal   Collection Time: 12/24/17  6:47 PM  Result Value Ref Range   Sodium 136 135 - 145 mmol/L   Potassium 4.3 3.5 - 5.1 mmol/L   Chloride 98 (L) 101 - 111 mmol/L   CO2 25 22 - 32 mmol/L   Glucose, Bld 208 (H) 65 - 99 mg/dL   BUN 21 (H) 6 - 20 mg/dL   Creatinine, Ser 1.54 (H) 0.61 - 1.24 mg/dL   Calcium 8.7 (L) 8.9 - 10.3 mg/dL   Total Protein 7.1 6.5 - 8.1 g/dL   Albumin 3.6 3.5 - 5.0 g/dL   AST 20 15 - 41 U/L   ALT 25 17 - 63 U/L   Alkaline Phosphatase 64 38 - 126 U/L   Total Bilirubin 0.6 0.3 - 1.2 mg/dL   GFR calc non Af Amer 44 (L) >60 mL/min   GFR calc Af Amer 51 (L) >60 mL/min    Comment: (NOTE) The eGFR has been calculated using the CKD EPI equation. This calculation has not been validated in all clinical situations. eGFR's persistently <60 mL/min signify possible Chronic Kidney Disease.    Anion gap 13 5 - 15    Comment: Performed at [facility] [address] [place], [place] [address]  Troponin I     Status: None   Collection Time: 12/24/17  6:47 PM  Result Value Ref Range   Troponin I <0.03 <0.03 ng/mL    Comment: Performed at [facility] [address], [place], [place] [address]  D-dimer, quantitative (not at [facility])     Status: None   Collection Time: 12/24/17  6:47 PM  Result  Value Ref Range   D-Dimer, Quant 0.43 0.00 - 0.50 ug/mL-FEU    Comment: (NOTE) At the manufacturer cut-off of 0.50 ug/mL FEU, this assay has been documented to exclude PE with a sensitivity and negative predictive value of 97 to 99%.  At this time, this assay has not been approved by the FDA to exclude DVT/VTE. Results should be correlated with clinical presentation. Performed at [facility], [place] [address] [place], [place] [address]   I-Stat Troponin, ED (not at [facility])     Status: None   Collection Time: 12/24/17  7:07 PM  Result Value Ref Range   Troponin i, poc 0.01 0.00 - 0.08 ng/mL   Comment 3            Comment: Due to the release kinetics of cTnI, a negative result within the first hours of the onset of symptoms does not rule out myocardial infarction with certainty. If myocardial infarction is still suspected, repeat the test at appropriate intervals.   POC CBG, ED     Status: Abnormal   Collection Time: 12/24/17  8:21 PM  Result Value Ref Range   Glucose-Capillary 186 (H) 65 - 99 mg/dL  CBG monitoring, ED     Status: Abnormal   Collection Time: 12/25/17 12:15 AM  Result Value Ref Range   Glucose-Capillary 210 (H) 65 - 99 mg/dL  Troponin I (q 6hr x 3)     Status: None   Collection Time: 12/25/17  1:53 AM  Result Value Ref Range   Troponin I <0.03 <0.03 ng/mL    Comment: Performed at [facility], [place] [address], [place], [place] [address]  Rapid urine drug screen (hospital performed)  Status: None   Collection Time: 12/25/17  2:52 AM  Result Value Ref Range   Opiates NONE DETECTED NONE DETECTED   Cocaine NONE DETECTED NONE DETECTED   Benzodiazepines NONE DETECTED NONE DETECTED   Amphetamines NONE DETECTED NONE DETECTED   Tetrahydrocannabinol NONE DETECTED NONE DETECTED   Barbiturates NONE DETECTED NONE DETECTED    Comment: (NOTE) DRUG SCREEN FOR MEDICAL PURPOSES ONLY.  IF CONFIRMATION IS NEEDED FOR ANY PURPOSE, NOTIFY LAB WITHIN 5 DAYS. LOWEST  DETECTABLE LIMITS FOR URINE DRUG SCREEN Drug Class                     Cutoff (ng/mL) Amphetamine and metabolites    1000 Barbiturate and metabolites    200 Benzodiazepine                 998 Tricyclics and metabolites     300 Opiates and metabolites        300 Cocaine and metabolites        300 THC                            50 Performed at [facility], [place] [address], [place], [place] [address]   CBG monitoring, ED     Status: Abnormal   Collection Time: 12/25/17  4:02 AM  Result Value Ref Range   Glucose-Capillary 187 (H) 65 - 99 mg/dL  Troponin I (q 6hr x 3)     Status: None   Collection Time: 12/25/17  5:17 AM  Result Value Ref Range   Troponin I <0.03 <0.03 ng/mL    Comment: Performed at [facility], [place] [address], [place], [place] [address]  Lipase, blood     Status: None   Collection Time: 12/25/17  5:17 AM  Result Value Ref Range   Lipase 25 11 - 51 U/L    Comment: Performed at [facility] [address], [place], [place] [address]  CBG monitoring, ED     Status: Abnormal   Collection Time: 12/25/17  5:32 AM  Result Value Ref Range   Glucose-Capillary 176 (H) 65 - 99 mg/dL  CBG monitoring, ED     Status: Abnormal   Collection Time: 12/25/17  8:23 AM  Result Value Ref Range   Glucose-Capillary 136 (H) 65 - 99 mg/dL  Troponin I (q 6hr x 3)     Status: None   Collection Time: 12/25/17 11:45 AM  Result Value Ref Range   Troponin I <0.03 <0.03 ng/mL    Comment: Performed at [facility], [place] [address], [place],  [address]  CBG monitoring, ED     Status: Abnormal   Collection Time: 12/25/17  1:33 PM  Result Value Ref Range   Glucose-Capillary 207 (H) 65 - 99 mg/dL     Lipid Panel  No results found for: CHOL, TRIG, HDL, CHOLHDL, VLDL, LDLCALC, LDLDIRECT   No results found for: HGBA1C   Lab Results  Component Value Date   CREATININE 1.54 (H) 12/24/2017        72 y.o.malewithhistory of diabetes mellitus type 2,  hypertension, hyperlipidemia BPH, probable chronic kidney disease presents to the ER with complaint of chest pain. Cardiac enzymes negative this admission. EKG within normal limits. D-dimer negative  HOSPITAL COURSE   1. Chest pain-concerning for angina. Patient is placed on nitroglycerin infusion. Patient already takes statins placed on aspirin.  negative cardiac markers,  d-dimer negative.   Patient was seen by  Cardiology, underwent cardiac cath that showed nonobstructive CAD .  discharged on  aspirin 325 mg / statin a day.  Follow renal function closely post cath 2. Hypertension on amlodipine., prazosin probably for BPH. 3. Diabetes mellitus type 2 , at home takes Lantus 64 units along with pre-meal NovoLog.    4. Hyperlipidemia on statins. 5. Probable chronic kidney disease stage III. follow renal panel post cath , bmp in 3-5 days  6. History of depression on Cymbalta.      Discharge Exam:   Blood pressure 123/68, pulse 85, temperature 98.2 F (36.8 C), temperature source Oral, resp. rate 15, height 6' (1.829 m), weight 98.9 kg (218 lb), SpO2 99 %.  Cardiac:  normal S1, S2; RRR; no murmur, no rub Lungs:  clear to auscultation bilaterally, no wheezing, rhonchi or rales  Abd: soft, nontender, no hepatomegaly  Ext: no edema Musculoskeletal:  No deformities, BUE and BLE strength normal and equal Skin: warm and dry  Neuro:  CNs 2-12 intact, no focal abnormalities noted Psych:  Normal affect        Signed: Reyne Dumas 12/25/2017, 3:56 PM

## 2017-12-25 NOTE — Care Management CC44 (Signed)
Condition Code 44 Documentation Completed  Patient Details  Name: Caleb Cuevas MRN: [id number] Date of Birth: June 04, 1946   Condition Code 44 given:  Yes Patient signature on Condition Code 44 notice:  Yes Documentation of 2 MD's agreement:  Yes Code 44 added to claim:  Yes    Lawerance Sabal, RN 12/25/2017, 4:49 PM

## 2017-12-25 NOTE — ED Notes (Addendum)
Paged admitting MD due to continued chest pain. MD asked this RN to page cardiology in regards to plan of care.

## 2017-12-25 NOTE — ED Notes (Signed)
CBG 210 

## 2017-12-25 NOTE — Interval H&P Note (Signed)
Cath Lab Visit (complete for each Cath Lab visit)  Clinical Evaluation Leading to the Procedure:   ACS: Yes.    Non-ACS:    Anginal Classification: CCS IV  Anti-ischemic medical therapy: Minimal Therapy (1 class of medications)  Non-Invasive Test Results: No non-invasive testing performed  Prior CABG: No previous CABG      History and Physical Interval Note:  12/25/2017 2:35 PM  Caleb Cuevas  has presented today for surgery, with the diagnosis of cp  The various methods of treatment have been discussed with the patient and family. After consideration of risks, benefits and other options for treatment, the patient has consented to  Procedure(s): LEFT HEART CATH AND CORONARY ANGIOGRAPHY (N/A) as a surgical intervention .  The patient's history has been reviewed, patient examined, no change in status, stable for surgery.  I have reviewed the patient's chart and labs.  Questions were answered to the patient's satisfaction.     Lance Muss

## 2017-12-25 NOTE — Progress Notes (Signed)
[facility] PROGRESS NOTE  Caleb Cuevas ZOX:[id number] DOB: 06/24/1946 DOA: 12/24/2017   PCP: System, Pcp Not In     Assessment/Plan: Principal Problem:   Chest pain Active Problems:   Diabetes mellitus type 2 in nonobese [facility])   Essential hypertension   HLD (hyperlipidemia)   Unstable angina ([facility])   72 y.o. male with history of diabetes mellitus type 2, hypertension, hyperlipidemia BPH, probable chronic kidney disease presents to the ER with complaint of chest pain. Cardiac enzymes negative this admission. EKG within normal limits. D-dimer negative  Assessment and plan   1. Chest pain -concerning for angina.  Patient is placed on nitroglycerin infusion.  Patient already takes statins placed on aspirin.   negative cardiac markers, d-dimer negative. 2-D echo pending. Patient is currently NPO , pending cardiology recommendations.  Cardiology notified patient will be kept n.p.o. in anticipation of possible cardiac procedure. Started on aspirin 325 mg a day. Currently on nitroglycerin infusion, will DC as the patient's EKG is unchanged, switch to Nitropaste   2. Hypertension on amlodipine., prazosin probably for BPH. 3. Diabetes mellitus type 2 takes Lantus 64 units along with pre-meal NovoLog.  Closely follow CBGs.  I am going to decrease patient's Lantus insulin based on his CBGs. 4. Hyperlipidemia on statins. 5. Probable chronic kidney disease stage III.  No labs to compare.  Follow metabolic panel. 6. History of depression on Cymbalta.      DVT prophylaxsis Lovenox  Code Status:  Full code     Family Communication: Discussed in detail with the patient, all imaging results, lab results explained to the patient   Disposition Plan:  As per cardiology recommendations      Consultants:  Cardiology   Procedures:  2-D echo  Antibiotics: Anti-infectives (From admission, onward)   None         HPI/Subjective: Continues to endorse ongoing chest  pain  Objective: Vitals:   12/25/17 0930 12/25/17 0948 12/25/17 1015 12/25/17 1030  BP: (!) 156/86 (!) 166/87 130/75 127/84  Pulse: 85  88 92  Resp: Temp:      TempSrc:      SpO2: 99%  95% 95%  Weight:      Height:        Intake/Output Summary (Last 24 hours) at 12/25/2017 1101 Last data filed at 12/25/2017 0246 Gross per 24 hour  Intake 1000 ml  Output 700 ml  Net 300 ml    Exam:  Examination:  General exam: Appears calm and comfortable  Respiratory system: Clear to auscultation. Respiratory effort normal. Cardiovascular system: S1 & S2 heard, RRR. No JVD, murmurs, rubs, gallops or clicks. No pedal edema. Gastrointestinal system: Abdomen is nondistended, soft and nontender. No organomegaly or masses felt. Normal bowel sounds heard. Central nervous system: Alert and oriented. No focal neurological deficits. Extremities: Symmetric 5 x 5 power. Skin: No rashes, lesions or ulcers Psychiatry: Judgement and insight appear normal. Mood & affect appropriate.     Data Reviewed: I have personally reviewed following labs and imaging studies  Micro Results No results found for this or any previous visit (from the past 240 hour(s)).  Radiology Reports Dg Chest 2 View  Result Date: 12/24/2017 CLINICAL DATA:  Chest pain and shortness of breath. EXAM: CHEST - 2 VIEW COMPARISON:  None. FINDINGS: Lateral view degraded by patient arm position. Midline trachea. Normal heart size and mediastinal contours. No pleural effusion or pneumothorax. Mildly low lung volumes. Mild subsegmental atelectasis  at the bases. IMPRESSION: Low lung volumes, without acute disease. Electronically Signed   By: Jeronimo Greaves M.D.   On: 12/24/2017 19:28     CBC Recent Labs  Lab 12/24/17 1847  WBC 7.7  HGB 14.2  HCT 44.0  PLT 182  MCV 80.9  MCH 26.1  MCHC 32.3  RDW 13.4  LYMPHSABS 1.8  MONOABS 0.8  EOSABS 0.3  BASOSABS 0.0    Chemistries  Recent Labs  Lab 12/24/17 1847  NA 136  K  4.3  CL 98*  CO2 25  GLUCOSE 208*  BUN 21*  CREATININE 1.54*  CALCIUM 8.7*  AST 20  ALT 25  ALKPHOS 64  BILITOT 0.6   ------------------------------------------------------------------------------------------------------------------ estimated creatinine clearance is 53.6 mL/min (A) (by C-G formula based on SCr of 1.54 mg/dL (H)). ------------------------------------------------------------------------------------------------------------------ No results for input(s): HGBA1C in the last 72 hours. ------------------------------------------------------------------------------------------------------------------ No results for input(s): CHOL, HDL, LDLCALC, TRIG, CHOLHDL, LDLDIRECT in the last 72 hours. ------------------------------------------------------------------------------------------------------------------ No results for input(s): TSH, T4TOTAL, T3FREE, THYROIDAB in the last 72 hours.  Invalid input(s): FREET3 ------------------------------------------------------------------------------------------------------------------ No results for input(s): VITAMINB12, FOLATE, FERRITIN, TIBC, IRON, RETICCTPCT in the last 72 hours.  Coagulation profile No results for input(s): INR, PROTIME in the last 168 hours.  Recent Labs    12/24/17 1847  DDIMER 0.43    Cardiac Enzymes Recent Labs  Lab 12/24/17 1847 12/25/17 0153 12/25/17 0517  TROPONINI <0.03 <0.03 <0.03   ------------------------------------------------------------------------------------------------------------------ Invalid input(s): POCBNP   CBG: Recent Labs  Lab 12/24/17 2021 12/25/17 0015 12/25/17 0402 12/25/17 0532 12/25/17 0823  GLUCAP 186* 210* 187* 176* 136*       Studies: Dg Chest 2 View  Result Date: 12/24/2017 CLINICAL DATA:  Chest pain and shortness of breath. EXAM: CHEST - 2 VIEW COMPARISON:  None. FINDINGS: Lateral view degraded by patient arm position. Midline trachea. Normal heart size  and mediastinal contours. No pleural effusion or pneumothorax. Mildly low lung volumes. Mild subsegmental atelectasis at the bases. IMPRESSION: Low lung volumes, without acute disease. Electronically Signed   By: Jeronimo Greaves M.D.   On: 12/24/2017 19:28      No results found for: HGBA1C Lab Results  Component Value Date   CREATININE 1.54 (H) 12/24/2017       Scheduled Meds: . amLODipine  5 mg Oral Daily  . aspirin EC  325 mg Oral Daily  . DULoxetine  60 mg Oral BID  . enoxaparin (LOVENOX) injection  40 mg Subcutaneous Q24H  . gabapentin  300 mg Oral BID  . insulin aspart  0-9 Units Subcutaneous Q4H  . pravastatin  40 mg Oral QHS  . prazosin  5 mg Oral BID   Continuous Infusions: . nitroGLYCERIN 35 mcg/min (12/25/17 0715)     LOS: 0 days    Time spent: >30 MINS    Richarda Overlie  [facility] Pager [phone]. If 7PM-7AM, please contact night-coverage at [email or web address], password [email or web address] 12/25/2017, 11:01 AM  LOS: 0 days

## 2017-12-25 NOTE — ED Notes (Signed)
Rhonda Barrett at bedside.  Continue Nitroglycerin infusion at same rate. Echo tech at bedside to take pt.  Per Barrett unable to go off floor not monitored.  Lindwood Coke, RN 6E to advise.  Per RN they will get bedside echo done.

## 2017-12-25 NOTE — ED Notes (Signed)
Called card master to have cardiology give an update. Will have cards to see patient within the hour. Family updated.

## 2017-12-25 NOTE — Consult Note (Addendum)
Cardiology Consultation:   Patient ID: Caleb Cuevas; [id number]; Dec 27, 1945   Admit date: 12/24/2017 Date of Consult: 12/25/2017  Primary Care Provider: System, Pcp Not In Primary Cardiologist: Dr Beckie Salts, not seen in several years Primary Electrophysiologist:  n/a   Patient Profile:   Caleb Cuevas is a 72 y.o. male with a hx of DM, HTN, HLD, who is being seen today for the evaluation of chest pain at the request of Dr Susie Cassette.  History of Present Illness:   [name] has been having intermittent chest pain x 2 weeks or so.  The first episode was when he was trying to cut grass, it resolved with rest in 10-15 minutes. It was L chest pressure. He did not try any rx.  Since then, he has had episodes daily or more often. They are associated with SOB, he has also had L arm numbness associated on more than 1 episode. He has been tired in general.  No N&V or diaphoresis (unless already outside in the heat). No rx tried.  Yesterday, he had several episodes, he realizes they have been coming on more often. Last pm, he had an episode that reached a 10/10. That is what made him come to the ER. EMS gave him SL NTG x 1 and ASA>>pain-free. However, SBP 90s and he got IVF for this.  In the ER, he has been on IV Nitro, ASA, morphine 4 mg x 1. Currently, he feels a little pain with deep inspiration or movement, but not ongoing. IV nitro is 35 mg/hr.  He had a cath (femoral approach) years ago in [place], had stress testing more recently in [place], all normal. Nothing in > 5 years.   They are living in [place] now, they moved here to live near their daughter.    Past Medical History:  Diagnosis Date  . Diabetes mellitus without complication (HCC)   . High cholesterol   . Hypertension     Past Surgical History:  Procedure Laterality Date  . BACK SURGERY    . CARDIAC CATHETERIZATION     before 2009, in [place], [place]. No PCI.  [name] WRIST SURGERY       Prior to Admission medications   Medication  Sig Start Date End Date Taking? Authorizing Provider  amLODipine (NORVASC) 10 MG tablet Take 5 mg by mouth daily.   Yes [provider]  aspirin EC 81 MG tablet Take 81 mg by mouth daily.   Yes [provider]  DULoxetine (CYMBALTA) 60 MG capsule Take 60 mg by mouth 2 (two) times daily.   Yes [provider]  ergocalciferol (VITAMIN D2) 50000 units capsule Take 50,000 Units by mouth every Sunday.   Yes [provider]  furosemide (LASIX) 20 MG tablet Take 20 mg by mouth daily.   Yes [provider]  gabapentin (NEURONTIN) 300 MG capsule Take 300 mg by mouth 2 (two) times daily.   Yes [provider]  insulin aspart (NOVOLOG FLEXPEN) 100 UNIT/ML FlexPen Inject 17 Units into the skin 3 (three) times daily with meals.   Yes [provider]  insulin glargine (LANTUS) 100 unit/mL SOPN Inject 64 Units into the skin at bedtime.   Yes [provider]  Meclizine HCl 25 MG CHEW Chew 25 mg by mouth 2 (two) times daily as needed (vertigo).   Yes [provider]  metFORMIN (GLUCOPHAGE) 1000 MG tablet Take 500 mg by mouth 2 (two) times daily with a meal.    Yes [provider]  naproxen sodium (ALEVE) 220 MG tablet Take 440 mg by mouth daily as needed (pain/headache).   Yes [provider]  potassium chloride (KLOR-CON) 8 MEQ tablet Take 8 mEq by mouth daily.   Yes [provider]  pravastatin (PRAVACHOL) 40 MG tablet Take 40 mg by mouth at bedtime.    Yes [provider]  prazosin (MINIPRESS) 5 MG capsule Take 5 mg by mouth 2 (two) times daily.   Yes [provider]  HYDROcodone-acetaminophen (NORCO/VICODIN) 5-325 MG tablet Take 1 tablet by mouth every 4 (four) hours as needed. Patient not taking: Reported on 12/24/2017 03/16/17   Caleb Canter, FNP    Inpatient Medications: Scheduled Meds: . amLODipine  5 mg Oral Daily  . aspirin EC  325 mg Oral Daily  . DULoxetine  60 mg Oral  BID  . enoxaparin (LOVENOX) injection  40 mg Subcutaneous Q24H  . gabapentin  300 mg Oral BID  . insulin aspart  0-9 Units Subcutaneous Q4H  . nitroGLYCERIN  0.5 inch Topical Q6H  . pravastatin  40 mg Oral QHS  . prazosin  5 mg Oral BID   Continuous Infusions:  PRN Meds: acetaminophen, morphine injection, ondansetron (ZOFRAN) IV  Allergies:   No Known Allergies  Social History:   Social History   Socioeconomic History  . Marital status: Married    Spouse name: Not on file  . Number of children: Not on file  . Years of education: Not on file  . Highest education level: Not on file  Occupational History  . Occupation: Retired  Engineer, production  . Financial resource strain: Not on file  . Food insecurity:    Worry: Not on file    Inability: Not on file  . Transportation needs:    Medical: Not on file    Non-medical: Not on file  Tobacco Use  . Smoking status: Former Games developer  . Smokeless tobacco: Never Used  Substance and Sexual Activity  . Alcohol use: No  . Drug use: No  . Sexual activity: Not on file  Lifestyle  . Physical activity:    Days per week: Not on file    Minutes per session: Not on file  . Stress: Not on file  Relationships  . Social connections:    Talks on phone: Not on file    Gets together: Not on file    Attends religious service: Not on file    Active member of club or organization: Not on file    Attends meetings of clubs or organizations: Not on file    Relationship status: Not on file  . Intimate partner violence:    Fear of current or ex partner: Not on file    Emotionally abused: Not on file    Physically abused: Not on file    Forced sexual activity: Not on file  Other Topics Concern  . Not on file  Social History Narrative   Moved to [place] from [place] to be near their daughter.    Family History:   Family History  Problem Relation Age of Onset  . Diabetes Mellitus II Mother   . CAD Neg Hx    Family Status:  Family Status  Relation  Name Status  . Mother  Deceased  . Father  Deceased  . Neg Hx  (Not Specified)    ROS:  Please see the history of present illness.  All other ROS reviewed and negative.     Physical Exam/Data:   Vitals:  12/25/17 1015 12/25/17 1030 12/25/17 1045 12/25/17 1115  BP: 130/75 127/84 (!) 117/57 114/64  Pulse: 88 92 94 93  Resp: Temp:      TempSrc:      SpO2: 95% 95% 96% 96%  Weight:      Height:        Intake/Output Summary (Last 24 hours) at 12/25/2017 1201 Last data filed at 12/25/2017 0246 Gross per 24 hour  Intake 1000 ml  Output 700 ml  Net 300 ml   Filed Weights   12/24/17 1824  Weight: 218 lb (98.9 kg)   Body mass index is 29.57 kg/m.  General:  Well nourished, well developed, in no acute distress HEENT: normal Lymph: no adenopathy Neck: no JVD Endocrine:  No thryomegaly Vascular: No carotid bruits; 4/4 extremity pulses 2+, without bruits  Cardiac:  normal S1, S2; RRR; no murmur, no rub Lungs:  clear to auscultation bilaterally, no wheezing, rhonchi or rales  Abd: soft, nontender, no hepatomegaly  Ext: no edema Musculoskeletal:  No deformities, BUE and BLE strength normal and equal Skin: warm and dry  Neuro:  CNs 2-12 intact, no focal abnormalities noted Psych:  Normal affect   EKG:  The EKG was personally reviewed and demonstrates:  05/20, ST, HR 103, diffuse T wave flattening, no old to compare Telemetry:  Telemetry was personally reviewed and demonstrates:  SR  Relevant CV Studies:  ECHO: ordered  Laboratory Data:  Chemistry Recent Labs  Lab 12/24/17 1847  NA 136  K 4.3  CL 98*  CO2 25  GLUCOSE 208*  BUN 21*  CREATININE 1.54*  CALCIUM 8.7*  GFRNONAA 44*  GFRAA 51*  ANIONGAP 13    Lab Results  Component Value Date   ALT 25 12/24/2017   AST 20 12/24/2017   ALKPHOS 64 12/24/2017   BILITOT 0.6 12/24/2017   Hematology Recent Labs  Lab 12/24/17 1847  WBC 7.7  RBC 5.44  HGB 14.2  HCT 44.0  MCV 80.9  MCH 26.1  MCHC  32.3  RDW 13.4  PLT 182   Cardiac Enzymes Recent Labs  Lab 12/24/17 1847 12/25/17 0153 12/25/17 0517  TROPONINI <0.03 <0.03 <0.03    Recent Labs  Lab 12/24/17 1907  TROPIPOC 0.01    BNP Recent Labs  Lab 12/24/17 1820  BNP 11.1    DDimer  Recent Labs  Lab 12/24/17 1847  DDIMER 0.43     Radiology/Studies:  Dg Chest 2 View  Result Date: 12/24/2017 CLINICAL DATA:  Chest pain and shortness of breath. EXAM: CHEST - 2 VIEW COMPARISON:  None. FINDINGS: Lateral view degraded by patient arm position. Midline trachea. Normal heart size and mediastinal contours. No pleural effusion or pneumothorax. Mildly low lung volumes. Mild subsegmental atelectasis at the bases. IMPRESSION: Low lung volumes, without acute disease. Electronically Signed   By: Jeronimo Greaves M.D.   On: 12/24/2017 19:28    Assessment and Plan:   Principal Problem: 1.  Chest pain - sx concerning for progressive angina - ez are elevated, ECG w/out Q waves or ST elevation - previous stress testing ok - Cr is somewhat elevated, chronicity unknown - discuss cath w/ MD - The risks and benefits of a cardiac catheterization including, but not limited to, death, stroke, MI, kidney damage and bleeding were discussed with the patient who indicates understanding and agrees to proceed.  - Of note, Cr 1.54 today, no old in computer - pt had 1 L IVF in ER due to  hypotension from nitro, no hx CKD  Otherwise per IM. If + CAD, would ck A1c and lipids/LFTs Active Problems:   Diabetes mellitus type 2 in nonobese [facility])   Essential hypertension   HLD (hyperlipidemia)   Unstable angina (HCC)     For questions or updates, please contact [facility] Please consult [email or web address] for contact info under Cardiology/STEMI.   Signed, Theodore Demark, PA-C  12/25/2017 12:01 PM   History and all data above reviewed.  Patient examined.  I agree with the findings as above.  The patient has long standing diabetes.  He has been  having chest pain for weeks.  He says that it has been happening at rest and getting worse.  He describes severe pain yesterday that waxed and waned.  This prompted him to call EMS.  He did have improvement with NTG although the pain has been recurrent.  He is not active currently because he has had a rather new onset decrease in exercise tolerance.  he has had inability to do things such as walking behind a self propelled mower.  In the ED he has had negative cardiac enzymes and no acute EKG changes.  He does have an elevated creat.  The patient exam reveals COR:RRR  ,  Lungs: Clear  ,  Abd: Positive bowel sounds, no rebound no guarding, Ext No edema, decreased DP/PT bilateral .  All available labs, radiology testing, previous records reviewed. Agree with documented assessment and plan.   Chest pain:  Very worrisome for unstable angina.  High risk for obstructive CAD.  Cardiac cath is indicated.  The patient understands that risks included but are not limited to stroke (1 in 1000), death (1 in 1000), kidney failure [usually temporary] (1 in 500), bleeding (1 in 200), allergic reaction [possibly serious] (1 in 200).  The patient understands and agrees to proceed.   He agrees.  Of note he has an elevated creat but has had hydration overnight.    Fayrene Fearing Miles Borkowski  2:44 PM  12/25/2017

## 2017-12-25 NOTE — ED Notes (Signed)
Pt placed on hospital bed. Pt aware of need for urine sample.

## 2017-12-25 NOTE — H&P (View-Only) (Signed)
[facility] PROGRESS NOTE  Ezechiel Stooksbury ZOX:[id number] DOB: 06/24/1946 DOA: 12/24/2017   PCP: System, Pcp Not In     Assessment/Plan: Principal Problem:   Chest pain Active Problems:   Diabetes mellitus type 2 in nonobese [facility])   Essential hypertension   HLD (hyperlipidemia)   Unstable angina (HCC)   72 y.o. male with history of diabetes mellitus type 2, hypertension, hyperlipidemia BPH, probable chronic kidney disease presents to the ER with complaint of chest pain. Cardiac enzymes negative this admission. EKG within normal limits. D-dimer negative  Assessment and plan   1. Chest pain -concerning for angina.  Patient is placed on nitroglycerin infusion.  Patient already takes statins placed on aspirin.   negative cardiac markers, d-dimer negative. 2-D echo pending. Patient is currently NPO , pending cardiology recommendations.  Cardiology notified patient will be kept n.p.o. in anticipation of possible cardiac procedure. Started on aspirin 325 mg a day. Currently on nitroglycerin infusion, will DC as the patient's EKG is unchanged, switch to Nitropaste   2. Hypertension on amlodipine., prazosin probably for BPH. 3. Diabetes mellitus type 2 takes Lantus 64 units along with pre-meal NovoLog.  Closely follow CBGs.  I am going to decrease patient's Lantus insulin based on his CBGs. 4. Hyperlipidemia on statins. 5. Probable chronic kidney disease stage III.  No labs to compare.  Follow metabolic panel. 6. History of depression on Cymbalta.      DVT prophylaxsis Lovenox  Code Status:  Full code     Family Communication: Discussed in detail with the patient, all imaging results, lab results explained to the patient   Disposition Plan:  As per cardiology recommendations      Consultants:  Cardiology   Procedures:  2-D echo  Antibiotics: Anti-infectives (From admission, onward)   None         HPI/Subjective: Continues to endorse ongoing chest  pain  Objective: Vitals:   12/25/17 0930 12/25/17 0948 12/25/17 1015 12/25/17 1030  BP: (!) 156/86 (!) 166/87 130/75 127/84  Pulse: 85  88 92  Resp: Temp:      TempSrc:      SpO2: 99%  95% 95%  Weight:      Height:        Intake/Output Summary (Last 24 hours) at 12/25/2017 1101 Last data filed at 12/25/2017 0246 Gross per 24 hour  Intake 1000 ml  Output 700 ml  Net 300 ml    Exam:  Examination:  General exam: Appears calm and comfortable  Respiratory system: Clear to auscultation. Respiratory effort normal. Cardiovascular system: S1 & S2 heard, RRR. No JVD, murmurs, rubs, gallops or clicks. No pedal edema. Gastrointestinal system: Abdomen is nondistended, soft and nontender. No organomegaly or masses felt. Normal bowel sounds heard. Central nervous system: Alert and oriented. No focal neurological deficits. Extremities: Symmetric 5 x 5 power. Skin: No rashes, lesions or ulcers Psychiatry: Judgement and insight appear normal. Mood & affect appropriate.     Data Reviewed: I have personally reviewed following labs and imaging studies  Micro Results No results found for this or any previous visit (from the past 240 hour(s)).  Radiology Reports Dg Chest 2 View  Result Date: 12/24/2017 CLINICAL DATA:  Chest pain and shortness of breath. EXAM: CHEST - 2 VIEW COMPARISON:  None. FINDINGS: Lateral view degraded by patient arm position. Midline trachea. Normal heart size and mediastinal contours. No pleural effusion or pneumothorax. Mildly low lung volumes. Mild subsegmental atelectasis  at the bases. IMPRESSION: Low lung volumes, without acute disease. Electronically Signed   By: Kyle  Talbot M.D.   On: 12/24/2017 19:28     CBC Recent Labs  Lab 12/24/17 1847  WBC 7.7  HGB 14.2  HCT 44.0  PLT 182  MCV 80.9  MCH 26.1  MCHC 32.3  RDW 13.4  LYMPHSABS 1.8  MONOABS 0.8  EOSABS 0.3  BASOSABS 0.0    Chemistries  Recent Labs  Lab 12/24/17 1847  NA 136  K  4.3  CL 98*  CO2 25  GLUCOSE 208*  BUN 21*  CREATININE 1.54*  CALCIUM 8.7*  AST 20  ALT 25  ALKPHOS 64  BILITOT 0.6   ------------------------------------------------------------------------------------------------------------------ estimated creatinine clearance is 53.6 mL/min (A) (by C-G formula based on SCr of 1.54 mg/dL (H)). ------------------------------------------------------------------------------------------------------------------ No results for input(s): HGBA1C in the last 72 hours. ------------------------------------------------------------------------------------------------------------------ No results for input(s): CHOL, HDL, LDLCALC, TRIG, CHOLHDL, LDLDIRECT in the last 72 hours. ------------------------------------------------------------------------------------------------------------------ No results for input(s): TSH, T4TOTAL, T3FREE, THYROIDAB in the last 72 hours.  Invalid input(s): FREET3 ------------------------------------------------------------------------------------------------------------------ No results for input(s): VITAMINB12, FOLATE, FERRITIN, TIBC, IRON, RETICCTPCT in the last 72 hours.  Coagulation profile No results for input(s): INR, PROTIME in the last 168 hours.  Recent Labs    12/24/17 1847  DDIMER 0.43    Cardiac Enzymes Recent Labs  Lab 12/24/17 1847 12/25/17 0153 12/25/17 0517  TROPONINI <0.03 <0.03 <0.03   ------------------------------------------------------------------------------------------------------------------ Invalid input(s): POCBNP   CBG: Recent Labs  Lab 12/24/17 2021 12/25/17 0015 12/25/17 0402 12/25/17 0532 12/25/17 0823  GLUCAP 186* 210* 187* 176* 136*       Studies: Dg Chest 2 View  Result Date: 12/24/2017 CLINICAL DATA:  Chest pain and shortness of breath. EXAM: CHEST - 2 VIEW COMPARISON:  None. FINDINGS: Lateral view degraded by patient arm position. Midline trachea. Normal heart size  and mediastinal contours. No pleural effusion or pneumothorax. Mildly low lung volumes. Mild subsegmental atelectasis at the bases. IMPRESSION: Low lung volumes, without acute disease. Electronically Signed   By: Kyle  Talbot M.D.   On: 12/24/2017 19:28      No results found for: HGBA1C Lab Results  Component Value Date   CREATININE 1.54 (H) 12/24/2017       Scheduled Meds: . amLODipine  5 mg Oral Daily  . aspirin EC  325 mg Oral Daily  . DULoxetine  60 mg Oral BID  . enoxaparin (LOVENOX) injection  40 mg Subcutaneous Q24H  . gabapentin  300 mg Oral BID  . insulin aspart  0-9 Units Subcutaneous Q4H  . pravastatin  40 mg Oral QHS  . prazosin  5 mg Oral BID   Continuous Infusions: . nitroGLYCERIN 35 mcg/min (12/25/17 0715)     LOS: 0 days    Time spent: >30 MINS    Nagee Goates  [facility] Pager [phone]. If 7PM-7AM, please contact night-coverage at [email or web address], password [email or web address] 12/25/2017, 11:01 AM  LOS: 0 days         

## 2017-12-25 NOTE — Progress Notes (Signed)
Patient and patient's wife wheeled via wheelchairs per RN and nurse tech to private car.  All belongings taken at time of discharge.  Right radial site level 0.  Gauze dressing remains clean, dry and intact.  Patient had no complaints at time of discharge.  Discharge instructions reviewed with patient and patient's wife.  All questions answered at time of discharge.

## 2017-12-25 NOTE — ED Notes (Signed)
Toniann Fail, MD messaged about diet order.

## 2017-12-25 NOTE — ED Notes (Signed)
Per Theodore Demark, cardiology, orders to keep Nitroglycerin infusing at this time.  NO paste at this time.

## 2017-12-25 NOTE — Discharge Instructions (Signed)

## 2017-12-25 NOTE — ED Notes (Signed)
Dr Hochrein at bedside 

## 2017-12-26 ENCOUNTER — Telehealth: Payer: Self-pay | Admitting: Cardiology

## 2017-12-26 ENCOUNTER — Encounter (HOSPITAL_COMMUNITY): Payer: Self-pay | Admitting: Interventional Cardiology

## 2017-12-26 MED FILL — Heparin Sod (Porcine)-NaCl IV Soln 1000 Unit/500ML-0.9%: INTRAVENOUS | Qty: 1000 | Status: AC

## 2017-12-26 NOTE — Telephone Encounter (Signed)
New Message:        Hochrein saw pt is the ER and daughter  is calling to see if he has spoke with his colleagues about referring her dad to a primary doctor

## 2018-01-07 NOTE — Telephone Encounter (Signed)
Do you know of any PCP for pt

## 2018-01-11 ENCOUNTER — Ambulatory Visit: Payer: Medicare Other | Admitting: Cardiology

## 2018-01-11 NOTE — Telephone Encounter (Signed)
In what area does he live?

## 2018-01-14 NOTE — Telephone Encounter (Signed)
Pt daughter stated she live in [place], pt have an appt to see Dr Antoine PocheHochrein on 06/20 and state she will speak with Dr Antoine PocheHochrein at that time to get a PCP
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
## 2018-01-24 ENCOUNTER — Encounter: Payer: Self-pay | Admitting: Cardiology

## 2018-01-24 ENCOUNTER — Ambulatory Visit: Payer: Medicare Other | Admitting: Cardiology

## 2018-01-24 VITALS — BP 104/64 | HR 93 | Ht 72.0 in | Wt 215.0 lb

## 2018-01-24 DIAGNOSIS — R5383 Other fatigue: Secondary | ICD-10-CM

## 2018-01-24 DIAGNOSIS — Z79899 Other long term (current) drug therapy: Secondary | ICD-10-CM | POA: Diagnosis not present

## 2018-01-24 DIAGNOSIS — R072 Precordial pain: Secondary | ICD-10-CM

## 2018-01-24 DIAGNOSIS — I10 Essential (primary) hypertension: Secondary | ICD-10-CM

## 2018-01-24 DIAGNOSIS — E1142 Type 2 diabetes mellitus with diabetic polyneuropathy: Secondary | ICD-10-CM | POA: Diagnosis not present

## 2018-01-24 DIAGNOSIS — Z794 Long term (current) use of insulin: Secondary | ICD-10-CM | POA: Diagnosis not present

## 2018-01-24 NOTE — Patient Instructions (Signed)
Medication Instructions:  Continue current medications  If you need a refill on your cardiac medications before your next appointment, please call your pharmacy.  Labwork: BMP, TSH, Hgb A1C HERE IN OUR OFFICE AT [facility]  Take the provided lab slips with you to the lab for your blood draw.   You will NOT need to fast   Testing/Procedures: None Ordered  Follow-Up: Your physician wants you to follow-up in: As Needed.      Thank you for choosing [facility] at [facility]!!

## 2018-01-25 LAB — BASIC METABOLIC PANEL
BUN / CREAT RATIO: 14 (ref 10–24)
BUN: 22 mg/dL (ref 8–27)
CHLORIDE: 96 mmol/L (ref 96–106)
CO2: 24 mmol/L (ref 20–29)
Calcium: 9 mg/dL (ref 8.6–10.2)
Creatinine, Ser: 1.62 mg/dL — ABNORMAL HIGH (ref 0.76–1.27)
GFR calc Af Amer: 49 mL/min/{1.73_m2} — ABNORMAL LOW (ref >59)
GFR calc non Af Amer: 42 mL/min/{1.73_m2} — ABNORMAL LOW (ref >59)
GLUCOSE: 197 mg/dL — AB (ref 65–99)
Potassium: 5.3 mmol/L — ABNORMAL HIGH (ref 3.5–5.2)
SODIUM: 137 mmol/L (ref 134–144)

## 2018-01-25 LAB — HEMOGLOBIN A1C
Est. average glucose Bld gHb Est-mCnc: 200 mg/dL
Hgb A1c MFr Bld: 8.6 % — ABNORMAL HIGH (ref 4.8–5.6)

## 2018-01-25 LAB — TSH: TSH: 2.63 u[IU]/mL (ref 0.450–4.500)

## 2018-01-28 ENCOUNTER — Telehealth: Payer: Self-pay | Admitting: *Deleted

## 2018-01-28 DIAGNOSIS — Z79899 Other long term (current) drug therapy: Secondary | ICD-10-CM

## 2018-01-28 NOTE — Telephone Encounter (Signed)
Spoke with pt daughter about her Dad blood work, BMP ordered and pt will be in office next week to have blood work done.

## 2018-01-28 NOTE — Telephone Encounter (Signed)
-----   Message from Rollene RotundaJames Hochrein, MD sent at 01/25/2018  7:55 AM EDT ----- Potassium is slightly high and creat slightly high.  I would like to repeat this in one week.  Call Mr. Manson PasseyBrown with the results

## 2018-02-06 LAB — BASIC METABOLIC PANEL
BUN/Creatinine Ratio: 11 (ref 10–24)
BUN: 18 mg/dL (ref 8–27)
CO2: 24 mmol/L (ref 20–29)
Calcium: 9.3 mg/dL (ref 8.6–10.2)
Chloride: 99 mmol/L (ref 96–106)
Creatinine, Ser: 1.64 mg/dL — ABNORMAL HIGH (ref 0.76–1.27)
GFR calc Af Amer: 48 mL/min/{1.73_m2} — ABNORMAL LOW (ref >59)
GFR, EST NON AFRICAN AMERICAN: 41 mL/min/{1.73_m2} — AB (ref >59)
Glucose: 79 mg/dL (ref 65–99)
Potassium: 5 mmol/L (ref 3.5–5.2)
Sodium: 139 mmol/L (ref 134–144)

## 2018-02-13 ENCOUNTER — Telehealth: Payer: Self-pay | Admitting: Cardiology

## 2018-02-13 NOTE — Telephone Encounter (Signed)
Follow Up: ° ° ° ° °Returning your call from Monday. °

## 2018-02-14 NOTE — Telephone Encounter (Signed)
Leave message for pt to call back 

## 2018-02-15 NOTE — Telephone Encounter (Signed)
Pt aware of his blood work  

## 2018-02-26 ENCOUNTER — Ambulatory Visit (INDEPENDENT_AMBULATORY_CARE_PROVIDER_SITE_OTHER): Payer: Medicare Other | Admitting: Family Medicine

## 2018-02-26 ENCOUNTER — Encounter: Payer: Self-pay | Admitting: Family Medicine

## 2018-02-26 VITALS — BP 80/46 | HR 95 | Temp 97.7°F | Resp 18 | Ht 72.0 in | Wt 211.0 lb

## 2018-02-26 DIAGNOSIS — I2584 Coronary atherosclerosis due to calcified coronary lesion: Secondary | ICD-10-CM | POA: Diagnosis not present

## 2018-02-26 DIAGNOSIS — F324 Major depressive disorder, single episode, in partial remission: Secondary | ICD-10-CM

## 2018-02-26 DIAGNOSIS — I2510 Atherosclerotic heart disease of native coronary artery without angina pectoris: Secondary | ICD-10-CM

## 2018-02-26 DIAGNOSIS — E118 Type 2 diabetes mellitus with unspecified complications: Secondary | ICD-10-CM | POA: Diagnosis not present

## 2018-02-26 DIAGNOSIS — Z794 Long term (current) use of insulin: Secondary | ICD-10-CM | POA: Diagnosis not present

## 2018-02-26 DIAGNOSIS — E1140 Type 2 diabetes mellitus with diabetic neuropathy, unspecified: Secondary | ICD-10-CM | POA: Diagnosis not present

## 2018-02-26 DIAGNOSIS — I952 Hypotension due to drugs: Secondary | ICD-10-CM

## 2018-02-26 DIAGNOSIS — E7800 Pure hypercholesterolemia, unspecified: Secondary | ICD-10-CM | POA: Diagnosis not present
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
## 2018-03-08 ENCOUNTER — Ambulatory Visit (INDEPENDENT_AMBULATORY_CARE_PROVIDER_SITE_OTHER): Payer: Medicare Other | Admitting: Family Medicine

## 2018-03-08 ENCOUNTER — Encounter: Payer: Self-pay | Admitting: Family Medicine

## 2018-03-08 VITALS — BP 112/60 | HR 99 | Temp 98.2°F | Resp 16 | Ht 72.0 in | Wt 214.0 lb

## 2018-03-08 DIAGNOSIS — I952 Hypotension due to drugs: Secondary | ICD-10-CM | POA: Diagnosis not present

## 2018-03-08 DIAGNOSIS — E1140 Type 2 diabetes mellitus with diabetic neuropathy, unspecified: Secondary | ICD-10-CM

## 2018-03-08 DIAGNOSIS — Z794 Long term (current) use of insulin: Secondary | ICD-10-CM
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
## 2018-04-15 ENCOUNTER — Other Ambulatory Visit: Payer: Self-pay | Admitting: *Deleted

## 2018-04-15 MED ORDER — INSULIN PEN NEEDLE 32G X 4 MM MISC: 1 refills | Status: DC

## 2018-04-15 MED ORDER — LIRAGLUTIDE 18 MG/3ML ~~LOC~~ SOPN: 1.2000 mg | PEN_INJECTOR | Freq: Every day | SUBCUTANEOUS | 11 refills | Status: DC

## 2018-04-26 ENCOUNTER — Ambulatory Visit: Payer: Medicare Other | Admitting: Family Medicine

## 2018-05-06 ENCOUNTER — Ambulatory Visit: Payer: Medicare Other | Admitting: Family Medicine

## 2018-05-10 ENCOUNTER — Ambulatory Visit (INDEPENDENT_AMBULATORY_CARE_PROVIDER_SITE_OTHER): Payer: Medicare Other | Admitting: Family Medicine

## 2018-05-10 ENCOUNTER — Encounter: Payer: Self-pay | Admitting: Family Medicine

## 2018-05-10 VITALS — BP 144/84 | HR 96 | Temp 98.1°F | Resp 16 | Ht 72.0 in | Wt 211.0 lb

## 2018-05-10 DIAGNOSIS — E1122 Type 2 diabetes mellitus with diabetic chronic kidney disease: Secondary | ICD-10-CM

## 2018-05-10 DIAGNOSIS — N183 Chronic kidney disease, stage 3 (moderate): Secondary | ICD-10-CM | POA: Diagnosis not present

## 2018-05-10 DIAGNOSIS — E1140 Type 2 diabetes mellitus with diabetic neuropathy, unspecified: Secondary | ICD-10-CM

## 2018-05-10 DIAGNOSIS — Z23 Encounter for immunization: Secondary | ICD-10-CM

## 2018-05-10 DIAGNOSIS — Z794 Long term (current) use of insulin: Secondary | ICD-10-CM
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
## 2018-05-11 LAB — CBC WITH DIFFERENTIAL/PLATELET
BASOS ABS: 59 {cells}/uL (ref 0–200)
Basophils Relative: 0.5 %
EOS PCT: 2.6 %
Eosinophils Absolute: 304 cells/uL (ref 15–500)
HCT: 49.5 % (ref 38.5–50.0)
HEMOGLOBIN: 16.2 g/dL (ref 13.2–17.1)
Lymphs Abs: 2387 cells/uL (ref 850–3900)
MCH: 26.3 pg — ABNORMAL LOW (ref 27.0–33.0)
MCHC: 32.7 g/dL (ref 32.0–36.0)
MCV: 80.2 fL (ref 80.0–100.0)
MONOS PCT: 7.6 %
MPV: 12 fL (ref 7.5–12.5)
Neutro Abs: 8061 cells/uL — ABNORMAL HIGH (ref 1500–7800)
Neutrophils Relative %: 68.9 %
Platelets: 178 10*3/uL (ref 140–400)
RBC: 6.17 10*6/uL — ABNORMAL HIGH (ref 4.20–5.80)
RDW: 14.5 % (ref 11.0–15.0)
Total Lymphocyte: 20.4 %
WBC mixed population: 889 cells/uL (ref 200–950)
WBC: 11.7 10*3/uL — AB (ref 3.8–10.8)

## 2018-05-11 LAB — HEMOGLOBIN A1C
Hgb A1c MFr Bld: 7.8 % of total Hgb — ABNORMAL HIGH (ref <5.7)
Mean Plasma Glucose: 177 (calc)
eAG (mmol/L): 9.8 (calc)

## 2018-05-11 LAB — COMPLETE METABOLIC PANEL WITH GFR
AG Ratio: 1.1 (calc) (ref 1.0–2.5)
ALBUMIN MSPROF: 4.1 g/dL (ref 3.6–5.1)
ALT: 29 U/L (ref 9–46)
AST: 19 U/L (ref 10–35)
Alkaline phosphatase (APISO): 102 U/L (ref 40–115)
BUN/Creatinine Ratio: 13 (calc) (ref 6–22)
BUN: 24 mg/dL (ref 7–25)
CO2: 30 mmol/L (ref 20–32)
CREATININE: 1.81 mg/dL — AB (ref 0.70–1.18)
Calcium: 9.3 mg/dL (ref 8.6–10.3)
Chloride: 97 mmol/L — ABNORMAL LOW (ref 98–110)
GFR, Est African American: 43 mL/min/{1.73_m2} — ABNORMAL LOW (ref >=60)
GFR, Est Non African American: 37 mL/min/{1.73_m2} — ABNORMAL LOW (ref >=60)
GLUCOSE: 171 mg/dL — AB (ref 65–99)
Globulin: 3.6 g/dL (calc) (ref 1.9–3.7)
Potassium: 4.8 mmol/L (ref 3.5–5.3)
Sodium: 137 mmol/L (ref 135–146)
Total Bilirubin: 0.4 mg/dL (ref 0.2–1.2)
Total Protein: 7.7 g/dL (ref 6.1–8.1)

## 2018-06-10 ENCOUNTER — Encounter (INDEPENDENT_AMBULATORY_CARE_PROVIDER_SITE_OTHER): Payer: Medicare Other | Admitting: Ophthalmology

## 2018-06-24 ENCOUNTER — Encounter (INDEPENDENT_AMBULATORY_CARE_PROVIDER_SITE_OTHER): Payer: Self-pay | Admitting: Ophthalmology

## 2018-06-24 ENCOUNTER — Ambulatory Visit (INDEPENDENT_AMBULATORY_CARE_PROVIDER_SITE_OTHER): Payer: Medicare Other | Admitting: Ophthalmology

## 2018-06-24 DIAGNOSIS — Z961 Presence of intraocular lens: Secondary | ICD-10-CM

## 2018-06-24 DIAGNOSIS — I10 Essential (primary) hypertension: Secondary | ICD-10-CM | POA: Diagnosis not present

## 2018-06-24 DIAGNOSIS — H25811 Combined forms of age-related cataract, right eye: Secondary | ICD-10-CM

## 2018-06-24 DIAGNOSIS — H3581 Retinal edema: Secondary | ICD-10-CM | POA: Diagnosis not present

## 2018-06-24 DIAGNOSIS — E113393 Type 2 diabetes mellitus with moderate nonproliferative diabetic retinopathy without macular edema, bilateral: Secondary | ICD-10-CM

## 2018-06-24 DIAGNOSIS — H35033 Hypertensive retinopathy, bilateral: Secondary | ICD-10-CM

## 2018-06-24 LAB — HM DIABETES EYE EXAM
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
## 2018-06-27 ENCOUNTER — Encounter: Payer: Self-pay | Admitting: Family Medicine

## 2018-06-27 DIAGNOSIS — E113299 Type 2 diabetes mellitus with mild nonproliferative diabetic retinopathy without macular edema, unspecified eye: Secondary | ICD-10-CM | POA: Insufficient documentation

## 2018-08-08 ENCOUNTER — Encounter: Payer: Self-pay | Admitting: *Deleted

## 2018-08-13 ENCOUNTER — Encounter: Payer: Self-pay | Admitting: Family Medicine

## 2018-08-13 ENCOUNTER — Ambulatory Visit (INDEPENDENT_AMBULATORY_CARE_PROVIDER_SITE_OTHER): Payer: Medicare Other | Admitting: Family Medicine

## 2018-08-13 VITALS — BP 100/62 | HR 114 | Temp 98.0°F | Resp 18 | Ht 72.0 in | Wt 208.0 lb

## 2018-08-13 DIAGNOSIS — E860 Dehydration: Secondary | ICD-10-CM | POA: Diagnosis not present

## 2018-08-13 DIAGNOSIS — E7800 Pure hypercholesterolemia, unspecified: Secondary | ICD-10-CM | POA: Diagnosis not present

## 2018-08-13 DIAGNOSIS — N183 Chronic kidney disease, stage 3 unspecified: Secondary | ICD-10-CM

## 2018-08-13 DIAGNOSIS — I952 Hypotension due to drugs: Secondary | ICD-10-CM | POA: Diagnosis not present

## 2018-08-13 DIAGNOSIS — Z794 Long term (current) use of insulin: Secondary | ICD-10-CM

## 2018-08-13 DIAGNOSIS — E1122 Type 2 diabetes mellitus with diabetic chronic kidney disease: Secondary | ICD-10-CM | POA: Diagnosis not present

## 2018-08-13 MED ORDER — PIOGLITAZONE HCL 15 MG PO TABS: 15.0000 mg | ORAL_TABLET | Freq: Every day | ORAL | 3 refills | Status: DC
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
## 2018-08-14 LAB — CBC WITH DIFFERENTIAL/PLATELET
Absolute Monocytes: 973 cells/uL — ABNORMAL HIGH (ref 200–950)
Basophils Absolute: 56 cells/uL (ref 0–200)
Basophils Relative: 0.4 %
EOS ABS: 70 {cells}/uL (ref 15–500)
EOS PCT: 0.5 %
HEMATOCRIT: 54.3 % — AB (ref 38.5–50.0)
HEMOGLOBIN: 18.1 g/dL — AB (ref 13.2–17.1)
LYMPHS ABS: 1793 {cells}/uL (ref 850–3900)
MCH: 26.6 pg — AB (ref 27.0–33.0)
MCHC: 33.3 g/dL (ref 32.0–36.0)
MCV: 79.9 fL — AB (ref 80.0–100.0)
MPV: 11.5 fL (ref 7.5–12.5)
Monocytes Relative: 7 %
NEUTROS ABS: 11009 {cells}/uL — AB (ref 1500–7800)
NEUTROS PCT: 79.2 %
Platelets: 222 10*3/uL (ref 140–400)
RBC: 6.8 10*6/uL — ABNORMAL HIGH (ref 4.20–5.80)
RDW: 15.1 % — AB (ref 11.0–15.0)
Total Lymphocyte: 12.9 %
WBC: 13.9 10*3/uL — ABNORMAL HIGH (ref 3.8–10.8)

## 2018-08-14 LAB — COMPLETE METABOLIC PANEL WITH GFR
AG Ratio: 1.3 (calc) (ref 1.0–2.5)
ALBUMIN MSPROF: 4.2 g/dL (ref 3.6–5.1)
ALKALINE PHOSPHATASE (APISO): 96 U/L (ref 40–115)
ALT: 24 U/L (ref 9–46)
AST: 19 U/L (ref 10–35)
BILIRUBIN TOTAL: 0.5 mg/dL (ref 0.2–1.2)
BUN / CREAT RATIO: 9 (calc) (ref 6–22)
BUN: 15 mg/dL (ref 7–25)
CO2: 30 mmol/L (ref 20–32)
Calcium: 9.3 mg/dL (ref 8.6–10.3)
Chloride: 98 mmol/L (ref 98–110)
Creat: 1.66 mg/dL — ABNORMAL HIGH (ref 0.70–1.18)
GFR, Est African American: 47 mL/min/{1.73_m2} — ABNORMAL LOW (ref >=60)
GFR, Est Non African American: 41 mL/min/{1.73_m2} — ABNORMAL LOW (ref >=60)
GLUCOSE: 135 mg/dL — AB (ref 65–99)
Globulin: 3.2 g/dL (calc) (ref 1.9–3.7)
Potassium: 4.2 mmol/L (ref 3.5–5.3)
SODIUM: 139 mmol/L (ref 135–146)
Total Protein: 7.4 g/dL (ref 6.1–8.1)

## 2018-08-14 LAB — LIPID PANEL
CHOLESTEROL: 145 mg/dL (ref <200)
HDL: 30 mg/dL — ABNORMAL LOW (ref >40)
LDL CHOLESTEROL (CALC): 90 mg/dL
Non-HDL Cholesterol (Calc): 115 mg/dL (calc) (ref <130)
Total CHOL/HDL Ratio: 4.8 (calc) (ref <5.0)
Triglycerides: 150 mg/dL — ABNORMAL HIGH (ref <150)

## 2018-08-14 LAB — HEMOGLOBIN A1C
EAG (MMOL/L): 9.2 (calc)
Hgb A1c MFr Bld: 7.4 % of total Hgb — ABNORMAL HIGH (ref <5.7)
Mean Plasma Glucose: 166 (calc)

## 2018-08-16 ENCOUNTER — Other Ambulatory Visit: Payer: Self-pay | Admitting: Family Medicine

## 2018-08-16 DIAGNOSIS — D582 Other hemoglobinopathies: Secondary | ICD-10-CM

## 2018-08-16 DIAGNOSIS — D72829 Elevated white blood cell count, unspecified: Secondary | ICD-10-CM

## 2018-08-19 ENCOUNTER — Telehealth: Payer: Self-pay | Admitting: Hematology and Oncology

## 2018-08-19 NOTE — Telephone Encounter (Signed)
New referral from Dr. Tanya Nones for elevated hgb/leukocytosis. I cld and spoke to Mr. Fracasso daughter Marcelino Duster to schedule an appt. An appt has been scheduled for the pt to see Dr. Pamelia Hoit on 1/18 at 11am. Aware to arrive 30 minutes early.

## 2018-08-24 ENCOUNTER — Inpatient Hospital Stay: Payer: Medicare Other

## 2018-08-24 ENCOUNTER — Telehealth: Payer: Self-pay | Admitting: Hematology and Oncology

## 2018-08-24 ENCOUNTER — Inpatient Hospital Stay: Payer: Medicare Other | Attending: Hematology and Oncology | Admitting: Hematology and Oncology

## 2018-08-24 DIAGNOSIS — D72828 Other elevated white blood cell count: Secondary | ICD-10-CM

## 2018-08-24 DIAGNOSIS — D751 Secondary polycythemia: Secondary | ICD-10-CM | POA: Diagnosis not present

## 2018-08-24 DIAGNOSIS — Z7982 Long term (current) use of aspirin: Secondary | ICD-10-CM | POA: Insufficient documentation

## 2018-08-24 DIAGNOSIS — D72829 Elevated white blood cell count, unspecified: Secondary | ICD-10-CM | POA: Insufficient documentation

## 2018-08-24 DIAGNOSIS — Z79899 Other long term (current) drug therapy: Secondary | ICD-10-CM

## 2018-08-24 DIAGNOSIS — Z794 Long term (current) use of insulin: Secondary | ICD-10-CM | POA: Insufficient documentation

## 2018-08-24 DIAGNOSIS — E119 Type 2 diabetes mellitus without complications: Secondary | ICD-10-CM | POA: Insufficient documentation

## 2018-08-24 DIAGNOSIS — Z87891 Personal history of nicotine dependence: Secondary | ICD-10-CM | POA: Diagnosis not present

## 2018-08-24 DIAGNOSIS — N189 Chronic kidney disease, unspecified: Secondary | ICD-10-CM | POA: Diagnosis not present

## 2018-08-24 LAB — CBC WITH DIFFERENTIAL (CANCER CENTER ONLY)
ABS IMMATURE GRANULOCYTES: 0.03 10*3/uL (ref 0.00–0.07)
BASOS ABS: 0.1 10*3/uL (ref 0.0–0.1)
Basophils Relative: 1 %
EOS ABS: 0.2 10*3/uL (ref 0.0–0.5)
Eosinophils Relative: 2 %
HCT: 49.8 % (ref 39.0–52.0)
Hemoglobin: 15.8 g/dL (ref 13.0–17.0)
IMMATURE GRANULOCYTES: 0 %
LYMPHS PCT: 18 %
Lymphs Abs: 1.7 10*3/uL (ref 0.7–4.0)
MCH: 26.1 pg (ref 26.0–34.0)
MCHC: 31.7 g/dL (ref 30.0–36.0)
MCV: 82.3 fL (ref 80.0–100.0)
Monocytes Absolute: 0.8 10*3/uL (ref 0.1–1.0)
Monocytes Relative: 8 %
NEUTROS ABS: 6.8 10*3/uL (ref 1.7–7.7)
NEUTROS PCT: 71 %
NRBC: 0 % (ref 0.0–0.2)
Platelet Count: 186 10*3/uL (ref 150–400)
RBC: 6.05 MIL/uL — ABNORMAL HIGH (ref 4.22–5.81)
RDW: 14.6 % (ref 11.5–15.5)
WBC Count: 9.6 10*3/uL (ref 4.0–10.5)

## 2018-08-24 NOTE — Assessment & Plan Note (Signed)
Most likely secondary to inflammation. It is related to elevation of neutrophils. No additional work-up is needed at this time.

## 2018-08-24 NOTE — Assessment & Plan Note (Signed)
I discussed with the patient extensively the differential diagnosis of polycythemia 1. Primary polycythemia due to clonal stem cell abnormality 2. Secondary polycythemia due to cause that include hypoxia, heart or lung problems, altitude, athletics, erythropoietin producing lesion/tumors etc  Lab review: 08/14/2018: Early BC 13.9, hemoglobin 18.1, MCV 79.9, RDW 15.1, ANC 11, absolute monocytes 9.73 Creatinine 1.66   Recommendation: 1. JAK-2 mutation testing to evaluate polycythemia vera 2. Erythropoietin level 3. Drink 8 glasses of water per day  Indications for phlebotomy 1. Primary polycythemia with hematocrit over 55 2. Secondary polycythemia with severe symptoms which include strokelike symptoms, severe recurrent headaches, severe fatigue.  Patient does not have any clear-cut symptoms that would require phlebotomy at this time. If the JAK-2 mutation is normal and erythropoietin level is normal, a bone marrow biopsy may be considered. If the erythropoietin is elevated, he will need ultrasound of the liver and kidney for further evaluation.

## 2018-08-24 NOTE — Telephone Encounter (Signed)
I informed the patient's daughter that his hemoglobin today is 15.8. The increase in his oral fluid intake has been helping him.
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
## 2018-08-25 LAB — ERYTHROPOIETIN: ERYTHROPOIETIN: 16.9 m[IU]/mL (ref 2.6–18.5)

## 2019-06-18 ENCOUNTER — Inpatient Hospital Stay (HOSPITAL_COMMUNITY): Payer: No Typology Code available for payment source

## 2019-06-18 ENCOUNTER — Other Ambulatory Visit: Payer: Self-pay

## 2019-06-18 ENCOUNTER — Emergency Department (HOSPITAL_COMMUNITY): Payer: No Typology Code available for payment source

## 2019-06-18 ENCOUNTER — Encounter (HOSPITAL_COMMUNITY): Payer: Self-pay | Admitting: Emergency Medicine

## 2019-06-18 ENCOUNTER — Observation Stay (HOSPITAL_COMMUNITY)
Admission: EM | Admit: 2019-06-18 | Discharge: 2019-06-19 | Disposition: A | Discharge status: Home / Self Care | Payer: No Typology Code available for payment source | Attending: Family Medicine | Admitting: Family Medicine

## 2019-06-18 DIAGNOSIS — Z794 Long term (current) use of insulin: Secondary | ICD-10-CM | POA: Insufficient documentation

## 2019-06-18 DIAGNOSIS — Z20828 Contact with and (suspected) exposure to other viral communicable diseases: Secondary | ICD-10-CM | POA: Diagnosis not present

## 2019-06-18 DIAGNOSIS — E1122 Type 2 diabetes mellitus with diabetic chronic kidney disease: Secondary | ICD-10-CM | POA: Diagnosis not present

## 2019-06-18 DIAGNOSIS — M1990 Unspecified osteoarthritis, unspecified site: Secondary | ICD-10-CM | POA: Insufficient documentation

## 2019-06-18 DIAGNOSIS — E1140 Type 2 diabetes mellitus with diabetic neuropathy, unspecified: Secondary | ICD-10-CM | POA: Insufficient documentation

## 2019-06-18 DIAGNOSIS — I6501 Occlusion and stenosis of right vertebral artery: Secondary | ICD-10-CM | POA: Diagnosis not present

## 2019-06-18 DIAGNOSIS — Z87891 Personal history of nicotine dependence: Secondary | ICD-10-CM | POA: Diagnosis not present

## 2019-06-18 DIAGNOSIS — Z7982 Long term (current) use of aspirin: Secondary | ICD-10-CM | POA: Diagnosis not present

## 2019-06-18 DIAGNOSIS — I10 Essential (primary) hypertension: Secondary | ICD-10-CM | POA: Diagnosis not present

## 2019-06-18 DIAGNOSIS — G459 Transient cerebral ischemic attack, unspecified: Secondary | ICD-10-CM | POA: Diagnosis not present

## 2019-06-18 DIAGNOSIS — Z79899 Other long term (current) drug therapy: Secondary | ICD-10-CM | POA: Diagnosis not present

## 2019-06-18 DIAGNOSIS — R29810 Facial weakness: Secondary | ICD-10-CM | POA: Diagnosis not present

## 2019-06-18 DIAGNOSIS — E785 Hyperlipidemia, unspecified: Secondary | ICD-10-CM | POA: Diagnosis not present

## 2019-06-18 DIAGNOSIS — D751 Secondary polycythemia: Secondary | ICD-10-CM | POA: Insufficient documentation

## 2019-06-18 DIAGNOSIS — R4781 Slurred speech: Secondary | ICD-10-CM | POA: Insufficient documentation

## 2019-06-18 DIAGNOSIS — E7800 Pure hypercholesterolemia, unspecified: Secondary | ICD-10-CM | POA: Diagnosis not present

## 2019-06-18 DIAGNOSIS — I6782 Cerebral ischemia: Secondary | ICD-10-CM | POA: Diagnosis not present

## 2019-06-18 DIAGNOSIS — N189 Chronic kidney disease, unspecified: Secondary | ICD-10-CM | POA: Diagnosis not present

## 2019-06-18 DIAGNOSIS — I6523 Occlusion and stenosis of bilateral carotid arteries: Secondary | ICD-10-CM | POA: Diagnosis not present

## 2019-06-18 DIAGNOSIS — I129 Hypertensive chronic kidney disease with stage 1 through stage 4 chronic kidney disease, or unspecified chronic kidney disease: Secondary | ICD-10-CM | POA: Insufficient documentation

## 2019-06-18 DIAGNOSIS — Z743 Need for continuous supervision: Secondary | ICD-10-CM | POA: Diagnosis not present

## 2019-06-18 DIAGNOSIS — E113293 Type 2 diabetes mellitus with mild nonproliferative diabetic retinopathy without macular edema, bilateral: Secondary | ICD-10-CM | POA: Diagnosis not present

## 2019-06-18 DIAGNOSIS — R531 Weakness: Secondary | ICD-10-CM | POA: Diagnosis not present

## 2019-06-18 LAB — PROTIME-INR
INR: 1 (ref 0.8–1.2)
Prothrombin Time: 13.5 seconds (ref 11.4–15.2)

## 2019-06-18 LAB — CBC
HCT: 47.6 % (ref 39.0–52.0)
Hemoglobin: 15.8 g/dL (ref 13.0–17.0)
MCH: 28.7 pg (ref 26.0–34.0)
MCHC: 33.2 g/dL (ref 30.0–36.0)
MCV: 86.4 fL (ref 80.0–100.0)
Platelets: 198 10*3/uL (ref 150–400)
RBC: 5.51 MIL/uL (ref 4.22–5.81)
RDW: 14.1 % (ref 11.5–15.5)
WBC: 11.5 10*3/uL — ABNORMAL HIGH (ref 4.0–10.5)
nRBC: 0 % (ref 0.0–0.2)

## 2019-06-18 LAB — ETHANOL: Alcohol, Ethyl (B): <10 mg/dL (ref <10)

## 2019-06-18 LAB — I-STAT CHEM 8, ED
BUN: 21 mg/dL (ref 8–23)
Calcium, Ion: 1.13 mmol/L — ABNORMAL LOW (ref 1.15–1.40)
Chloride: 100 mmol/L (ref 98–111)
Creatinine, Ser: 1.5 mg/dL — ABNORMAL HIGH (ref 0.61–1.24)
Glucose, Bld: 101 mg/dL — ABNORMAL HIGH (ref 70–99)
HCT: 48 % (ref 39.0–52.0)
Hemoglobin: 16.3 g/dL (ref 13.0–17.0)
Potassium: 4.1 mmol/L (ref 3.5–5.1)
Sodium: 141 mmol/L (ref 135–145)
TCO2: 27 mmol/L (ref 22–32)

## 2019-06-18 LAB — COMPREHENSIVE METABOLIC PANEL
ALT: 12 U/L (ref 0–44)
AST: 45 U/L — ABNORMAL HIGH (ref 15–41)
Albumin: 3.5 g/dL (ref 3.5–5.0)
Alkaline Phosphatase: 78 U/L (ref 38–126)
Anion gap: 12 (ref 5–15)
BUN: 20 mg/dL (ref 8–23)
CO2: 25 mmol/L (ref 22–32)
Calcium: 9 mg/dL (ref 8.9–10.3)
Chloride: 100 mmol/L (ref 98–111)
Creatinine, Ser: 1.68 mg/dL — ABNORMAL HIGH (ref 0.61–1.24)
GFR calc Af Amer: 46 mL/min — ABNORMAL LOW (ref >60)
GFR calc non Af Amer: 40 mL/min — ABNORMAL LOW (ref >60)
Glucose, Bld: 103 mg/dL — ABNORMAL HIGH (ref 70–99)
Potassium: 4.9 mmol/L (ref 3.5–5.1)
Sodium: 137 mmol/L (ref 135–145)
Total Bilirubin: 1.8 mg/dL — ABNORMAL HIGH (ref 0.3–1.2)
Total Protein: 7.1 g/dL (ref 6.5–8.1)

## 2019-06-18 LAB — RAPID URINE DRUG SCREEN, HOSP PERFORMED
Amphetamines: NOT DETECTED
Barbiturates: NOT DETECTED
Benzodiazepines: NOT DETECTED
Cocaine: NOT DETECTED
Opiates: NOT DETECTED
Tetrahydrocannabinol: NOT DETECTED

## 2019-06-18 LAB — DIFFERENTIAL
Abs Immature Granulocytes: 0.05 10*3/uL (ref 0.00–0.07)
Basophils Absolute: 0.1 10*3/uL (ref 0.0–0.1)
Basophils Relative: 0 %
Eosinophils Absolute: 0.3 10*3/uL (ref 0.0–0.5)
Eosinophils Relative: 2 %
Immature Granulocytes: 0 %
Lymphocytes Relative: 31 %
Lymphs Abs: 3.6 10*3/uL (ref 0.7–4.0)
Monocytes Absolute: 1 10*3/uL (ref 0.1–1.0)
Monocytes Relative: 9 %
Neutro Abs: 6.5 10*3/uL (ref 1.7–7.7)
Neutrophils Relative %: 58 %

## 2019-06-18 LAB — MAGNESIUM: Magnesium: 2.1 mg/dL (ref 1.7–2.4)

## 2019-06-18 LAB — CBG MONITORING, ED: Glucose-Capillary: 233 mg/dL — ABNORMAL HIGH (ref 70–99)

## 2019-06-18 LAB — HEMOGLOBIN A1C
Hgb A1c MFr Bld: 7.8 % — ABNORMAL HIGH (ref 4.8–5.6)
Mean Plasma Glucose: 177.16 mg/dL

## 2019-06-18 LAB — URINALYSIS, ROUTINE W REFLEX MICROSCOPIC
Bacteria, UA: NONE SEEN
Bilirubin Urine: NEGATIVE
Glucose, UA: NEGATIVE mg/dL
Hgb urine dipstick: NEGATIVE
Ketones, ur: NEGATIVE mg/dL
Leukocytes,Ua: NEGATIVE
Nitrite: NEGATIVE
Protein, ur: 100 mg/dL — AB
Specific Gravity, Urine: 1.011 (ref 1.005–1.030)
pH: 5 (ref 5.0–8.0)

## 2019-06-18 LAB — APTT: aPTT: 26 seconds (ref 24–36)

## 2019-06-18 LAB — ECHOCARDIOGRAM COMPLETE

## 2019-06-18 LAB — LACTATE DEHYDROGENASE: LDH: 110 U/L (ref 98–192)

## 2019-06-18 LAB — PHOSPHORUS: Phosphorus: 4.7 mg/dL — ABNORMAL HIGH (ref 2.5–4.6)

## 2019-06-18 LAB — URIC ACID: Uric Acid, Serum: 8.2 mg/dL (ref 3.7–8.6)

## 2019-06-18 MED ORDER — GABAPENTIN 300 MG PO CAPS
300.0000 mg | ORAL_CAPSULE | Freq: Two times a day (BID) | ORAL | Status: DC
  Administered 2019-06-19: 300 mg via ORAL
  Filled 2019-06-18: qty 1

## 2019-06-18 MED ORDER — STROKE: EARLY STAGES OF RECOVERY BOOK: Freq: Once | Status: DC

## 2019-06-18 MED ORDER — HEPARIN SODIUM (PORCINE) 5000 UNIT/ML IJ SOLN: 5000.0000 [IU] | Freq: Three times a day (TID) | INTRAMUSCULAR | Status: DC

## 2019-06-18 MED ORDER — ACETAMINOPHEN 325 MG PO TABS: 650.0000 mg | ORAL_TABLET | ORAL | Status: DC | PRN

## 2019-06-18 MED ORDER — STUDY - AXIOMATIC STUDY - ASPIRIN 100 MG (PI-SETHI)
100.0000 mg | ORAL_TABLET | Freq: Every day | ORAL | Status: DC
  Filled 2019-06-18: qty 100

## 2019-06-18 MED ORDER — STUDY - AXIOMATIC STUDY - CLOPIDOGREL 75MG (PI-SETHI)
75.0000 mg | ORAL_TABLET | Freq: Every day | ORAL | Status: DC
  Administered 2019-06-19: 75 mg via ORAL
  Filled 2019-06-18: qty 75

## 2019-06-18 MED ORDER — CLOPIDOGREL BISULFATE 75 MG PO TABS: 75.0000 mg | ORAL_TABLET | Freq: Every day | ORAL | Status: DC

## 2019-06-18 MED ORDER — STUDY - AXIOMATIC STUDY - BMS986177 OR PLACEBO (PI-SETHI)
2.0000 | ORAL_CAPSULE | Freq: Two times a day (BID) | ORAL | Status: DC
  Administered 2019-06-18 – 2019-06-19 (×2): 2 via ORAL
  Filled 2019-06-18 (×2): qty 2

## 2019-06-18 MED ORDER — STUDY - AXIOMATIC STUDY - ASPIRIN 100 MG (PI-SETHI)
100.0000 mg | ORAL_TABLET | Freq: Every day | ORAL | Status: DC
  Administered 2019-06-18 – 2019-06-19 (×2): 100 mg via ORAL
  Filled 2019-06-18: qty 100

## 2019-06-18 MED ORDER — IOHEXOL 350 MG/ML SOLN
75.0000 mL | Freq: Once | INTRAVENOUS | Status: AC | PRN
  Administered 2019-06-18: 75 mL via INTRAVENOUS

## 2019-06-18 MED ORDER — STUDY - AXIOMATIC STUDY - CLOPIDOGREL 75MG (PI-SETHI)
300.0000 mg | ORAL_TABLET | Freq: Once | ORAL | Status: AC
  Administered 2019-06-18: 300 mg via ORAL
  Filled 2019-06-18: qty 300

## 2019-06-18 MED ORDER — ATORVASTATIN CALCIUM 80 MG PO TABS: 80.0000 mg | ORAL_TABLET | Freq: Every day | ORAL | Status: DC

## 2019-06-18 MED ORDER — ASPIRIN 325 MG PO TABS: 325.0000 mg | ORAL_TABLET | Freq: Every day | ORAL | Status: DC

## 2019-06-18 MED ORDER — ACETAMINOPHEN 650 MG RE SUPP: 650.0000 mg | RECTAL | Status: DC | PRN

## 2019-06-18 MED ORDER — CLOPIDOGREL BISULFATE 300 MG PO TABS: 300.0000 mg | ORAL_TABLET | Freq: Once | ORAL | Status: DC

## 2019-06-18 MED ORDER — PRAVASTATIN SODIUM 40 MG PO TABS: 40.0000 mg | ORAL_TABLET | Freq: Every day | ORAL | Status: DC

## 2019-06-18 MED ORDER — INSULIN ASPART 100 UNIT/ML ~~LOC~~ SOLN
0.0000 [IU] | SUBCUTANEOUS | Status: DC
  Administered 2019-06-19: 15 [IU] via SUBCUTANEOUS
  Administered 2019-06-19: 7 [IU] via SUBCUTANEOUS

## 2019-06-18 MED ORDER — VITAMIN D (ERGOCALCIFEROL) 1.25 MG (50000 UNIT) PO CAPS: 50000.0000 [IU] | ORAL_CAPSULE | ORAL | Status: DC

## 2019-06-18 MED ORDER — INSULIN GLARGINE 100 UNIT/ML ~~LOC~~ SOLN
20.0000 [IU] | Freq: Every day | SUBCUTANEOUS | Status: DC
  Administered 2019-06-19: 20 [IU] via SUBCUTANEOUS
  Filled 2019-06-18 (×2): qty 0.2

## 2019-06-18 MED ORDER — DULOXETINE HCL 60 MG PO CPEP
60.0000 mg | ORAL_CAPSULE | Freq: Two times a day (BID) | ORAL | Status: DC
  Administered 2019-06-19: 60 mg via ORAL
  Filled 2019-06-18: qty 1

## 2019-06-18 MED ORDER — STUDY - AXIOMATIC STUDY - BMS986177 OR PLACEBO (PI-SETHI)
4.0000 | ORAL_CAPSULE | Freq: Two times a day (BID) | ORAL | Status: DC
  Filled 2019-06-18 (×2): qty 4

## 2019-06-18 MED ORDER — SENNOSIDES-DOCUSATE SODIUM 8.6-50 MG PO TABS: 1.0000 | ORAL_TABLET | Freq: Every evening | ORAL | Status: DC | PRN

## 2019-06-18 MED ORDER — ACETAMINOPHEN 160 MG/5ML PO SOLN: 650.0000 mg | ORAL | Status: DC | PRN

## 2019-06-18 NOTE — Code Documentation (Signed)
73 yo male coming from home with complaints of sudden onset of left facial droop and slurred speech per family. Pt was outside on the porch and LKW at 1300. AT 6579, family came back out and patient was exhibiting left facial droop and slurred speech. EMS called and activated a Code Stroke. Reported symptoms resolved before arrival. Stroke Team met patient upon arrival. Initial NIHSS 1 due to sensory decreased on the left side. Pt reported some "off-balanced" issues while using his walker for the past couple days and an intermittent headache. CT completed. Not deemed a tPA candidate due to being too mild to treat. Pt remains inside the tPA window until 1730. Handoff given to [facility], Therapist, sports.

## 2019-06-18 NOTE — Consult Note (Addendum)
Neurology Consultation  Reason for Consult: Code stroke Referring Physician: Jodi MourningZavitz  CC: Left-sided decreased sensation  History is obtained from: Patient  HPI: Caleb Cuevas is a 73 y.o. male with history of type 2 diabetes, diabetic retinopathy, hypertension, high cholesterol.  Per wife patient was last seen normal at 1300 hrs. today.  He was sitting on the porch.  At 1315 he was noted to have difficulty standing up and slurred speech along with left-sided facial droop.  By time EMS had arrived symptoms had resolved.  On arrival blood pressure was 120/78 pulse 96 and glucose 120.  Patient states he has been having some gait issues over the last 2 days along with a headache.  He also has had a fall last week .   ED course CT head and labs  Chart review-patient has no notes in epic   Past Medical History:  Diagnosis Date  . Arthritis    "all over" (12/25/2017)  . High cholesterol   . Hx of myocardial perfusion scan    [facility] No ischemia  . Hypertension   . Nonproliferative diabetic retinopathy ([facility])    bilateral  . Type II diabetes mellitus ([facility])    Family History  Problem Relation Age of Onset  . Diabetes Mellitus II Mother   . CAD Neg Hx    Social History:   reports that he quit smoking about 39 years ago. His smoking use included cigarettes. He has a 4.50 pack-year smoking history. He has never used smokeless tobacco. He reports that he does not drink alcohol or use drugs.  Medications No current facility-administered medications for this encounter.   Current Outpatient Medications:  .  aspirin EC 81 MG tablet, Take 81 mg by mouth daily., Disp: , Rfl:  .  DULoxetine (CYMBALTA) 60 MG capsule, Take 60 mg by mouth 2 (two) times daily., Disp: , Rfl:  .  ergocalciferol (VITAMIN D2) 50000 units capsule, Take 50,000 Units by mouth every Sunday., Disp: , Rfl:  .  gabapentin (NEURONTIN) 300 MG capsule, Take 300 mg by mouth 2 (two)  times daily., Disp: , Rfl:  .  insulin aspart (NOVOLOG FLEXPEN) 100 UNIT/ML FlexPen, Inject 14 Units into the skin 3 (three) times daily with meals. 13 AM -13 Lunch -15 Dinner, Disp: , Rfl:  .  insulin glargine (LANTUS) 100 unit/mL SOPN, Inject 64 Units into the skin at bedtime., Disp: , Rfl:  .  Insulin Pen Needle (BD PEN NEEDLE NANO U/F) 32G X 4 MM MISC, USE AS DIRECTED TO INJECT VICTOZA SQ QD, Disp: 100 each, Rfl: 1 .  liraglutide (VICTOZA) 18 MG/3ML SOPN, Inject 0.2 mLs (1.2 mg total) into the skin daily., Disp: 3 pen, Rfl: 11 .  Meclizine HCl 25 MG CHEW, Chew 25 mg by mouth 2 (two) times daily as needed (vertigo)., Disp: , Rfl:  .  pioglitazone (ACTOS) 15 MG tablet, Take 1 tablet (15 mg total) by mouth daily., Disp: 30 tablet, Rfl: 3 .  pravastatin (PRAVACHOL) 40 MG tablet, Take 40 mg by mouth at bedtime. , Disp: , Rfl:    Exam: Current vital signs: There were no vitals taken for this visit. Vital signs in last 24 hours:    ROS:    General ROS: negative for - chills, fatigue, fever, night sweats, weight gain or weight loss Psychological ROS: negative for - behavioral disorder, hallucinations, memory difficulties, mood swings or suicidal ideation Ophthalmic ROS: negative for - blurry vision, double vision, eye  pain or loss of vision ENT ROS: negative for - epistaxis, nasal discharge, oral lesions, sore throat, tinnitus or vertigo Respiratory ROS: negative for - cough, hemoptysis, shortness of breath or wheezing Cardiovascular ROS: negative for - chest pain, dyspnea on exertion, edema or irregular heartbeat Gastrointestinal ROS: negative for - abdominal pain, diarrhea, hematemesis, nausea/vomiting or stool incontinence Genito-Urinary ROS: negative for - dysuria, hematuria, incontinence or urinary frequency/urgency Musculoskeletal ROS: Positive for -  muscular weakness Neurological ROS: as noted in HPI Dermatological ROS: negative for rash and skin lesion changes   Physical Exam    Constitutional: Appears well-developed and well-nourished.  Psych: Affect appropriate to situation Eyes: No scleral injection HENT: No OP obstrucion Head: Normocephalic.  Cardiovascular: Normal rate and regular rhythm.  Respiratory: Effort normal, non-labored breathing GI: Soft.  No distension. There is no tenderness.  Skin: WDI with ulcer on the medial left ankle  Neuro: Mental Status: Patient is awake, alert, oriented to person, place, month, year, and situation. Patient is able to give a clear and coherent history. No signs of aphasia or neglect Cranial Nerves: II: Visual Fields are full.  III,IV, VI: EOMI without ptosis or diploplia. Pupils equal, round and reactive to light V: Facial sensation is symmetric to temperature VII: Facial movement is symmetric.  VIII: hearing is intact to voice X: Palat elevates symmetrically XI: Shoulder shrug is symmetric. XII: tongue is midline without atrophy or fasciculations.  Motor: Tone is normal. Bulk is normal. 5/5 strength was present in all four extremities.  It is noted that he also has thenar atrophy in bilateral hands Sensory: Sensation is symmetric to light touch and temperature in the arms, patient has severe decrease sensation to light touch and temperature up to his knees.  He has no proprioception and has no vibratory sensation and the lower extremities with mild up at knee. Deep Tendon Reflexes: 2+ and symmetric in the biceps no patella or ankle reflex Plantars: Mute bilaterally Cerebellar: FNF and HKS are intact bilaterally  Labs I have reviewed labs in epic and the results pertinent to this consultation are:   CBC    Component Value Date/Time   WBC 9.6 08/24/2018 1135   WBC 13.9 (H) 08/13/2018 1140   RBC 6.05 (H) 08/24/2018 1135   HGB 16.3 06/18/2019 1413   HGB 15.8 08/24/2018 1135   HCT 48.0 06/18/2019 1413   PLT 186 08/24/2018 1135   MCV 82.3 08/24/2018 1135   MCH 26.1 08/24/2018 1135   MCHC 31.7  08/24/2018 1135   RDW 14.6 08/24/2018 1135   LYMPHSABS 1.7 08/24/2018 1135   MONOABS 0.8 08/24/2018 1135   EOSABS 0.2 08/24/2018 1135   BASOSABS 0.1 08/24/2018 1135    CMP     Component Value Date/Time   NA 141 06/18/2019 1413   NA 139 02/06/2018 1010   K 4.1 06/18/2019 1413   CL 100 06/18/2019 1413   CO2 30 08/13/2018 1140   GLUCOSE 101 (H) 06/18/2019 1413   BUN 21 06/18/2019 1413   BUN 18 02/06/2018 1010   CREATININE 1.50 (H) 06/18/2019 1413   CREATININE 1.66 (H) 08/13/2018 1140   CALCIUM 9.3 08/13/2018 1140   PROT 7.4 08/13/2018 1140   ALBUMIN 3.6 12/24/2017 1847   AST 19 08/13/2018 1140   ALT 24 08/13/2018 1140   ALKPHOS 64 12/24/2017 1847   BILITOT 0.5 08/13/2018 1140   GFRNONAA 41 (L) 08/13/2018 1140   GFRAA 47 (L) 08/13/2018 1140    Lipid Panel     Component Value  Date/Time   CHOL 145 08/13/2018 1140   TRIG 150 (H) 08/13/2018 1140   HDL 30 (L) 08/13/2018 1140   CHOLHDL 4.8 08/13/2018 1140   LDLCALC 90 08/13/2018 1140     Imaging I have reviewed the images obtained:  CT-scan of the brain-no acute intracranial hemorrhage or evidence of acute infarction-addendum of CT did show some fullness in the region of the anterior communicating artery.  For that reason we will obtain CTA of head and neck to rule out any form of aneurysm.  MRI examination of the brain-pending  CTA of head and neck ordered and pending  Felicie Morn PA-C [facility] [phone]  M-F  (9:00 am- 5:00 PM)  06/18/2019, 2:22 PM     Assessment:  A 73 year old male presenting with left-sided decreased sensation with negative CT.  Differential at this time would be small stroke versus TIA.  Patient also mentions multiple falls however he does know that he has severe neuropathy in his lower extremities which I believe is the cause of his falls as he has started using a cane because he cannot feel his lower extremities.   Impression: -Left-sided decreased sensation -Multiple  falls -Severe diabetic neuropathy  Recommend # MRI of the brain without contrast #CTA of head neck #Transthoracic Echo, nitor  # Start patient on ASA 325mg  daily,  #Start  Atorvastatin 80 mg/other high intensity statin # BP goal: permissive HTN upto 220/120 mmHg # HBAIC and Lipid profile # Telemetry monitoring # Frequent neuro checks # NPO until passes stroke swallow screen # please page stroke NP  Or  PA  Or MD from 8am -4 pm  as this patient from this time will be  followed by the stroke.   You can look them up on [email or web address]  Password [email or web address]   NEUROHOSPITALIST ADDENDUM Performed a face to face diagnostic evaluation.   I have reviewed the contents of history and physical exam as documented by PA/ARNP/Resident and agree with above documentation.  I have discussed and formulated the above plan as documented. Edits to the note have been made as needed.   73 year old male patient presents with sudden onset left-sided reduced sensation, left facial droop and slurred speech that began around 1 PM today witnessed by his family.  Symptoms have significantly improved on arrival and patient complained only of reduced sensation of the left side.  tPA was not administered as symptoms are mild and nondisabling.  Patient's wife also states that he had been having increased balance issues for the last couple of days as well as complained of a headache and nausea and some neck pain. Patient does have significant diabetic neuropathy however given his symptoms there is concern for posterior circulation TIA versus stroke.  MRI brain was obtained which is negative for any acute infarct.  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  Patient also patient has history of polycythemia vera.  He is on aspirin 81 mg at home.  Plan Complete stroke work-up as stated above,  Stroke team considering enrolling in BMS trial ( Addition for factor XI a inh to ASA or Plavix vs standard care).  If  not enrolled consider dual antiplatelet therapy.  A pulmonologist started the surgery so late        MD [facility] 12-05-2002   If 7pm to 7am, please call on call as listed on AMION.

## 2019-06-18 NOTE — ED Notes (Signed)
Patient transported to MRI 

## 2019-06-18 NOTE — ED Notes (Signed)
Patient transported to CT 

## 2019-06-18 NOTE — ED Triage Notes (Signed)
Pt bib [facility] code stroke, LKN at 22, wife and daughter found pt at 56 w/ L side facial droop, slurred speech, and unable to stand up. Resolved prior to arrival. VSS. Per wife, pt has fallen twice recently and has c/o of headaches. Upon arrival, pt has sensory deficit on L side, states it feels different, NIH of 1.
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
## 2019-06-18 NOTE — Progress Notes (Signed)
Echocardiogram 2D Echocardiogram has been performed.  Oneal Deputy Bogdan Vivona 06/18/2019, 3:37 PM

## 2019-06-19 ENCOUNTER — Encounter (HOSPITAL_COMMUNITY): Payer: Self-pay

## 2019-06-19 DIAGNOSIS — G459 Transient cerebral ischemic attack, unspecified: Principal | ICD-10-CM

## 2019-06-19 LAB — LIPID PANEL
Cholesterol: 125 mg/dL (ref 0–200)
HDL: 27 mg/dL — ABNORMAL LOW (ref >40)
LDL Cholesterol: 73 mg/dL (ref 0–99)
Total CHOL/HDL Ratio: 4.6 RATIO
Triglycerides: 123 mg/dL (ref <150)
VLDL: 25 mg/dL (ref 0–40)

## 2019-06-19 LAB — GLUCOSE, CAPILLARY
Glucose-Capillary: 115 mg/dL — ABNORMAL HIGH (ref 70–99)
Glucose-Capillary: 206 mg/dL — ABNORMAL HIGH (ref 70–99)
Glucose-Capillary: 303 mg/dL — ABNORMAL HIGH (ref 70–99)
Glucose-Capillary: 57 mg/dL — ABNORMAL LOW (ref 70–99)

## 2019-06-19 LAB — CBC
HCT: 43.1 % (ref 39.0–52.0)
Hemoglobin: 14.5 g/dL (ref 13.0–17.0)
MCH: 29.5 pg (ref 26.0–34.0)
MCHC: 33.6 g/dL (ref 30.0–36.0)
MCV: 87.6 fL (ref 80.0–100.0)
Platelets: 190 10*3/uL (ref 150–400)
RBC: 4.92 MIL/uL (ref 4.22–5.81)
RDW: 14.1 % (ref 11.5–15.5)
WBC: 8.2 10*3/uL (ref 4.0–10.5)
nRBC: 0 % (ref 0.0–0.2)

## 2019-06-19 LAB — TSH: TSH: 1.969 u[IU]/mL (ref 0.350–4.500)

## 2019-06-19 LAB — VITAMIN B12: Vitamin B-12: 360 pg/mL (ref 180–914)

## 2019-06-19 LAB — SARS CORONAVIRUS 2 (TAT 6-24 HRS): SARS Coronavirus 2: NEGATIVE

## 2019-06-19 LAB — FOLATE: Folate: 18.4 ng/mL (ref >5.9)

## 2019-06-19 NOTE — Evaluation (Signed)
Physical Therapy Evaluation Patient Details Name: Lieutenant Abarca MRN: [id number] DOB: [birth year] Today's Date: 06/19/2019   History of Present Illness  Patient is a 73 year old male with past medical history of polycythemia diabetes type 2, hypertension, hypercholesterolemia was brought in to the emergency this afternoon by the EMS with a chief complaints of slurred speech, left-sided facial droop and difficulty standing that lasted for less than an hour. MRI negative for acute infarct.   Clinical Impression  Pt was seen for mobility after his L side weakness developed, but now is more even for strength.  Even so, has B ankle weakness and some loss of balance with turning esp on stairs.  Uses his UE's to support with turns, comfortable with furniture walking at home.  Follow up with outpatient therapy to challenge his balance more, and to establish a maintenance program that will help him avoid any future falls given his history.  Follow acutely as well for strength, balance and safety with gait.  Will need an AD from what he already has, and needs to be a part of the determination for compliance.    Follow Up Recommendations Outpatient PT    Equipment Recommendations  None recommended by PT    Recommendations for Other Services       Precautions / Restrictions Precautions Precautions: Fall Precaution Comments: previously was a furniture walker vs using [place] Restrictions Weight Bearing Restrictions: No      Mobility  Bed Mobility Overal bed mobility: Needs Assistance Bed Mobility: Supine to Sit;Sit to Supine     Supine to sit: Min guard Sit to supine: Min guard   General bed mobility comments: HOB elevated and pt used bed rail  Transfers Overall transfer level: Needs assistance Equipment used: None             General transfer comment: pt has a RW and will need to use it  Ambulation/Gait Ambulation/Gait assistance: Min guard Gait Distance (Feet): 150 Feet Assistive  device: 1 person hand held assist Gait Pattern/deviations: Step-through pattern;Decreased stride length;Wide base of support Gait velocity: reduced Gait velocity interpretation: <1.31 ft/sec, indicative of household ambulator General Gait Details: pt is contact support for safety due to his wide turns and tendency to step insecurely at times  Stairs Stairs: Yes Stairs assistance: Supervision;Min guard Stair Management: One rail Right;Alternating pattern;Forwards Number of Stairs: 3 General stair comments: pt is noted to be fairly safe but family will need to supervise him due to [place]  Wheelchair Mobility    Modified Rankin (Stroke Patients Only) Modified Rankin (Stroke Patients Only) Pre-Morbid Rankin Score: No significant disability Modified Rankin: Moderate disability     Balance Overall balance assessment: Modified Independent                                           Pertinent Vitals/Pain Pain Assessment: No/denies pain    Home Living Family/patient expects to be discharged to:: Private residence Living Arrangements: Spouse/significant other;Children Available Help at Discharge: Family;Available 24 hours/day Type of Home: House Home Access: Stairs to enter   [employer] of Steps: threshold step to enter Home Layout: Two level;Able to live on main level with bedroom/bathroom Home Equipment: Walker - 4 wheels;Cane - single point;Grab bars - toilet;Grab bars - tub/shower;Shower seat - built in Additional Comments: Patients wife, daughter and granddaughter provide assist as needed and transportation assistance  Prior Function Level of Independence: Independent with assistive device(s)         Comments: Patient reports he does have some "bad days" where his wife will help him but usually independent with self care     Hand Dominance   Dominant Hand: Right    Extremity/Trunk Assessment   Upper Extremity Assessment Upper  Extremity Assessment: Overall WFL for tasks assessed    Lower Extremity Assessment Lower Extremity Assessment: Generalized weakness    Cervical / Trunk Assessment Cervical / Trunk Assessment: Normal  Communication   Communication: No difficulties  Cognition Arousal/Alertness: Awake/alert Behavior During Therapy: WFL for tasks assessed/performed Overall Cognitive Status: Within Functional Limits for tasks assessed                                        General Comments General comments (skin integrity, edema, etc.): pt was able to discuss need for outpatient rehab at home for ankle weakness and recent fall history    Exercises     Assessment/Plan    PT Assessment Patient needs continued PT services  PT Problem List Decreased strength;Decreased range of motion;Decreased activity tolerance;Decreased balance;Decreased mobility;Decreased coordination;Decreased knowledge of use of DME;Decreased safety awareness       PT Treatment Interventions DME instruction;Gait training;Stair training;Functional mobility training;Therapeutic activities;Therapeutic exercise;Balance training;Neuromuscular re-education;Patient/family education    PT Goals (Current goals can be found in the Care Plan section)  Acute Rehab PT Goals Patient Stated Goal: to get home and go to PT PT Goal Formulation: With patient Time For Goal Achievement: 07/03/19 Potential to Achieve Goals: Good    Frequency Min 3X/week   Barriers to discharge Inaccessible home environment;Decreased caregiver support home with family and stairs to enter    Co-evaluation               AM-PAC PT "6 Clicks" Mobility  Outcome Measure Help needed turning from your back to your side while in a flat bed without using bedrails?: None Help needed moving from lying on your back to sitting on the side of a flat bed without using bedrails?: A Little Help needed moving to and from a bed to a chair (including a  wheelchair)?: A Little Help needed standing up from a chair using your arms (e.g., wheelchair or bedside chair)?: A Little Help needed to walk in hospital room?: A Little Help needed climbing 3-5 steps with a railing? : A Little 6 Click Score: 19    End of Session Equipment Utilized During Treatment: Gait belt Activity Tolerance: Patient limited by fatigue;Treatment limited secondary to medical complications (Comment) Patient left: in bed;with call bell/phone within reach;with bed alarm set Nurse Communication: Mobility status PT Visit Diagnosis: Muscle weakness (generalized) (M62.81);Difficulty in walking, not elsewhere classified (R26.2)    Time: 2841-3244 PT Time Calculation (min) (ACUTE ONLY): 14 min   Charges:   PT Evaluation $PT Eval Moderate Complexity: 1 Mod         Ivar Drape 06/19/2019, 12:23 PM   Samul Dada, PT MS Acute Rehab Dept. Number: [facility] [id number] and [facility] [phone]
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
## 2019-06-19 NOTE — Progress Notes (Signed)
STROKE TEAM PROGRESS NOTE   INTERVAL HISTORY I have personally reviewed history of presenting illness with the patient, review of electronic medical records and imaging films in PACS.  He presented with transient episode of slurred speech left facial droop and arm weakness which resolved upon arrival.  Is remained stable overnight without recurrent symptoms.  MRI scan of the brain was negative for stroke.  Patient met criteria and consented to participate in the BMS Axiomatic   Trial for stroke prevention  Vitals:   06/18/19 2140 06/18/19 2230 06/19/19 0030 06/19/19 0430  BP: (!) 146/68 (!) 146/68 (!) 152/93 (!) 142/78  Pulse: 84 84 85 80  Resp: _0 Temp: 98.9 F (37.2 C) 98.9 F (37.2 C) 98.5 F (36.9 C)   TempSrc: Oral Oral Oral   SpO2: 98%  97% 97%  Weight:  90.7 kg    Height:  6' (1.829 m)      CBC:  Recent Labs  Lab 06/18/19 1419 06/19/19 0540  WBC 11.5* 8.2  NEUTROABS 6.5  --   HGB 15.8 14.5  HCT 47.6 43.1  MCV 86.4 87.6  PLT 198 423    Basic Metabolic Panel:  Recent Labs  Lab 06/18/19 1413 06/18/19 1419 06/18/19 2044  NA 141 137  --   K 4.1 4.9  --   CL 100 100  --   CO2  --  25  --   GLUCOSE 101* 103*  --   BUN 21 20  --   CREATININE 1.50* 1.68*  --   CALCIUM  --  9.0  --   MG  --   --  2.1  PHOS  --   --  4.7*   Lipid Panel:     Component Value Date/Time   CHOL 125 06/19/2019 0540   TRIG 123 06/19/2019 0540   HDL 27 (L) 06/19/2019 0540   CHOLHDL 4.6 06/19/2019 0540   VLDL 25 06/19/2019 0540   LDLCALC 73 06/19/2019 0540   LDLCALC 90 08/13/2018 1140   HgbA1c:  Lab Results  Component Value Date   HGBA1C 7.8 (H) 06/18/2019   Urine Drug Screen:     Component Value Date/Time   LABOPIA NONE DETECTED 06/18/2019 1600   COCAINSCRNUR NONE DETECTED 06/18/2019 1600   LABBENZ NONE DETECTED 06/18/2019 1600   AMPHETMU NONE DETECTED 06/18/2019 1600   THCU NONE DETECTED 06/18/2019 1600   LABBARB NONE DETECTED 06/18/2019 1600    Alcohol Level      Component Value Date/Time   ETH <10 06/18/2019 1420    IMAGING Ct Angio Head W Or Wo Contrast  Result Date: 06/18/2019 CLINICAL DATA:  Focal neuro deficit, less than 6 hours, stroke suspected. EXAM: CT ANGIOGRAPHY HEAD AND NECK TECHNIQUE: Multidetector CT imaging of the head and neck was performed using the standard protocol during bolus administration of intravenous contrast. Multiplanar CT image reconstructions and MIPs were obtained to evaluate the vascular anatomy. Carotid stenosis measurements (when applicable) are obtained utilizing NASCET criteria, using the distal internal carotid diameter as the denominator. CONTRAST:  65m OMNIPAQUE IOHEXOL 350 MG/ML SOLN COMPARISON:  Noncontrast head CT performed earlier the same day 06/18/2019 FINDINGS: CTA NECK FINDINGS Aortic arch: Common origin of the innominate and left common carotid arteries. Scattered soft calcified plaque within the visualized aortic arch and proximal major branch vessels of the neck. Right carotid system: CCA and ICA patent within the neck without measurable stenosis. Mild scattered soft and calcified plaque within the right carotid system. Left  carotid system: CCA and ICA patent within the neck without measurable stenosis. Mild scattered soft and calcified plaque within the carotid system, including mixed plaque at the carotid bifurcation. Vertebral arteries: Left vertebral artery slightly dominant. Soft plaque results in focal occlusive or near occlusive ostial stenosis within the right vertebral artery. Mixed plaque at the origin of the left vertebral artery with mild ostial stenosis. The bilateral vertebral arteries are otherwise patent within the neck without significant stenosis (50% or greater). Skeleton: No acute bony abnormality. Cervical spondylosis with multilevel posterior disc osteophytes, uncovertebral and facet hypertrophy. Most notably, there is advanced C3-C4 disc height loss with a prominent posterior disc  osteophyte complex at this level. Other neck: No soft tissue neck mass or pathologically enlarged cervical chain lymph nodes. Thyroid negative. Upper chest: No consolidation within the imaged lung apices. Review of the MIP images confirms the above findings CTA HEAD FINDINGS Anterior circulation: The intracranial internal carotid arteries are patent bilaterally. Calcified plaque greatest within the distal cavernous/paraclinoid segments with sites of no more than mild stenosis on the right and sites of up to mild/moderate stenosis on the left. The bilateral middle and anterior cerebral arteries are patent without significant proximal stenosis. Atherosclerotic irregularity of the bilateral M2 branch vessels without occlusion identified. 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 Posterior circulation: The intracranial vertebral arteries are patent. Plaque within the non dominant intracranial right vertebral artery with sites of up to moderate stenosis. The dominant intracranial left vertebral artery is patent without significant stenosis, as is the basilar artery. The bilateral posterior cerebral arteries are patent without significant proximal stenosis. Posterior communicating arteries are poorly delineated and may be hypoplastic or absent bilaterally. Venous sinuses: Within limitations of contrast timing, no convincing thrombus. Dominant right transverse and sigmoid dural venous sinuses. Anatomic variants: As described. Review of the MIP images confirms the above findings These results were called by telephone at the time of interpretation on 06/18/2019 at 5:40 pm to provider Dr. Lorraine Lax, who verbally acknowledged these results. IMPRESSION: CTA neck: 1. CCA and ICA widely patent within the neck without measurable stenosis. Mild atherosclerotic disease within the carotid systems as described. 2. Soft plaque results in focally occlusive or near occlusive ostial stenosis of the right vertebral artery with immediate  reconstitution. Bilateral vertebral arteries otherwise patent within the neck without flow-limiting stenosis. CTA head: 1. No intracranial large vessel occlusion or proximal high-grade arterial stenosis. 2. Intracranial atherosclerotic disease with multifocal stenoses, most notably as follows. 3. Sites of up to moderate stenosis within the non dominant intracranial right vertebral artery. 4. Sites of up to mild/moderate stenosis within the paraclinoid left ICA. 5. 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 Electronically Signed   By: Kellie Simmering DO   On: 06/18/2019 17:41   Ct Angio Neck W Or Wo Contrast  Result Date: 06/18/2019 CLINICAL DATA:  Focal neuro deficit, less than 6 hours, stroke suspected. EXAM: CT ANGIOGRAPHY HEAD AND NECK TECHNIQUE: Multidetector CT imaging of the head and neck was performed using the standard protocol during bolus administration of intravenous contrast. Multiplanar CT image reconstructions and MIPs were obtained to evaluate the vascular anatomy. Carotid stenosis measurements (when applicable) are obtained utilizing NASCET criteria, using the distal internal carotid diameter as the denominator. CONTRAST:  47m OMNIPAQUE IOHEXOL 350 MG/ML SOLN COMPARISON:  Noncontrast head CT performed earlier the same day 06/18/2019 FINDINGS: CTA NECK FINDINGS Aortic arch: Common origin of the innominate and left common carotid arteries. Scattered soft calcified plaque within the visualized aortic arch  and proximal major branch vessels of the neck. Right carotid system: CCA and ICA patent within the neck without measurable stenosis. Mild scattered soft and calcified plaque within the right carotid system. Left carotid system: CCA and ICA patent within the neck without measurable stenosis. Mild scattered soft and calcified plaque within the carotid system, including mixed plaque at the carotid bifurcation. Vertebral arteries: Left vertebral artery slightly dominant. Soft plaque results in focal  occlusive or near occlusive ostial stenosis within the right vertebral artery. Mixed plaque at the origin of the left vertebral artery with mild ostial stenosis. The bilateral vertebral arteries are otherwise patent within the neck without significant stenosis (50% or greater). Skeleton: No acute bony abnormality. Cervical spondylosis with multilevel posterior disc osteophytes, uncovertebral and facet hypertrophy. Most notably, there is advanced C3-C4 disc height loss with a prominent posterior disc osteophyte complex at this level. Other neck: No soft tissue neck mass or pathologically enlarged cervical chain lymph nodes. Thyroid negative. Upper chest: No consolidation within the imaged lung apices. Review of the MIP images confirms the above findings CTA HEAD FINDINGS Anterior circulation: The intracranial internal carotid arteries are patent bilaterally. Calcified plaque greatest within the distal cavernous/paraclinoid segments with sites of no more than mild stenosis on the right and sites of up to mild/moderate stenosis on the left. The bilateral middle and anterior cerebral arteries are patent without significant proximal stenosis. Atherosclerotic irregularity of the bilateral M2 branch vessels without occlusion identified. 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 Posterior circulation: The intracranial vertebral arteries are patent. Plaque within the non dominant intracranial right vertebral artery with sites of up to moderate stenosis. The dominant intracranial left vertebral artery is patent without significant stenosis, as is the basilar artery. The bilateral posterior cerebral arteries are patent without significant proximal stenosis. Posterior communicating arteries are poorly delineated and may be hypoplastic or absent bilaterally. Venous sinuses: Within limitations of contrast timing, no convincing thrombus. Dominant right transverse and sigmoid dural venous sinuses. Anatomic variants: As  described. Review of the MIP images confirms the above findings These results were called by telephone at the time of interpretation on 06/18/2019 at 5:40 pm to provider Dr. Lorraine Lax, who verbally acknowledged these results. IMPRESSION: CTA neck: 1. CCA and ICA widely patent within the neck without measurable stenosis. Mild atherosclerotic disease within the carotid systems as described. 2. Soft plaque results in focally occlusive or near occlusive ostial stenosis of the right vertebral artery with immediate reconstitution. Bilateral vertebral arteries otherwise patent within the neck without flow-limiting stenosis. CTA head: 1. No intracranial large vessel occlusion or proximal high-grade arterial stenosis. 2. Intracranial atherosclerotic disease with multifocal stenoses, most notably as follows. 3. Sites of up to moderate stenosis within the non dominant intracranial right vertebral artery. 4. Sites of up to mild/moderate stenosis within the paraclinoid left ICA. 5. 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 Electronically Signed   By: Kellie Simmering DO   On: 06/18/2019 17:41   Mr Brain Wo Contrast  Result Date: 06/18/2019 CLINICAL DATA:  TIA, initial exam. EXAM: MRI HEAD WITHOUT CONTRAST TECHNIQUE: Multiplanar, multiecho pulse sequences of the brain and surrounding structures were obtained without intravenous contrast. COMPARISON:  CT head and CT angiogram head/neck performed earlier the same day 06/18/2019 FINDINGS: Brain: Mildly motion degraded examination. There is no convincing evidence of acute infarct. No evidence of intracranial mass. No midline shift or extra-axial fluid collection. No chronic intracranial blood products identified on motion degraded T2* GRE imaging. No significant white matter disease for  age. Mild generalized parenchymal atrophy. Vascular: Flow voids poorly assessed on the acquired sequences. Skull and upper cervical spine: No focal marrow lesion identified. Sinuses/Orbits: Orbits  unremarkable. No significant paranasal sinus disease. Small left mastoid effusion. IMPRESSION: 1. Mildly motion degraded examination. 2. No evidence of acute intracranial abnormality, including acute infarct. 3. Mild generalized parenchymal atrophy. 4. Small left mastoid effusion. Electronically Signed   By: Kellie Simmering DO   On: 06/18/2019 20:48   Ct Head Code Stroke Wo Contrast  Addendum Date: 06/18/2019   ADDENDUM REPORT: 06/18/2019 14:36 ADDENDUM: There is some fullness in the region of the anterior communicating artery. Vascular imaging is recommended to exclude aneurysm if not already planned. Electronically Signed   By: Macy Mis M.D.   On: 06/18/2019 14:36   Result Date: 06/18/2019 CLINICAL DATA:  Code stroke. EXAM: CT HEAD WITHOUT CONTRAST TECHNIQUE: Contiguous axial images were obtained from the base of the skull through the vertex without intravenous contrast. COMPARISON:  None. FINDINGS: Brain: There is no acute intracranial hemorrhage, mass-effect, or edema. Gray-white differentiation is preserved. There is no extra-axial fluid collection. Prominence of the ventricles and sulci reflects minor generalized parenchymal volume loss. Patchy hypoattenuation in the supratentorial white matter is nonspecific but may reflect mild chronic microvascular ischemic changes. Vascular: No hyperdense vessel. There is atherosclerotic calcification at the skull base. Skull: Calvarium is unremarkable. Sinuses/Orbits: No acute finding. Other: None. ASPECTS [facility] Stroke Program Early CT Score) - Ganglionic level infarction (caudate, lentiform nuclei, internal capsule, insula, M1-M3 cortex): 7 - Supraganglionic infarction (M4-M6 cortex): 3 Total score (0-10 with 10 being normal): 10 IMPRESSION: 1. No acute intracranial hemorrhage or evidence of acute infarction. 2. ASPECTS is 10. 3. Mild chronic microvascular ischemic changes. Electronically Signed: By: Macy Mis M.D. On: 06/18/2019 14:26    PHYSICAL  EXAM Pleasant mildly obese elderly African-American male not in distress. . Afebrile. Head is nontraumatic. Neck is supple without bruit.    Cardiac exam no murmur or gallop. Lungs are clear to auscultation. Distal pulses are well felt. Neurological Exam ;  Awake  Alert oriented x 3. Normal speech and language.eye movements full without nystagmus.fundi were not visualized. Vision acuity and fields appear normal. Hearing is normal. Palatal movements are normal. Face symmetric. Tongue midline. Normal strength, tone, reflexes and coordination. Normal sensation. Gait deferred. ASSESSMENT/PLAN Mr. Erlin Gardella is a 73 y.o. male with history of type 2 diabetes, diabetic retinopathy, hypertension, high cholesterol presenting with gait issues x 2 days with fall last week, transient slurred speech and L facial weakness earlier in the day.   R brain TIA  Code Stroke CT head No acute abnormality. Small vessel disease. Fullness in the ACom area. ASPECTS 10.     CTA head no LVO. Intracranial atherosclerosis w/ multiple stenoses (moderate R VA, mild/mod L paraclinoid ICA). 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   CTA neck mild ICA atherosclerosis. Soft plaque w/ occlusion near occlusion R VA.  MRI  No acute abnormality. Mild atrophy. Small L mastoid effusion.   2D Echo EF 50-55%. No source of embolus   LDL 73  HgbA1c 7.8  No VTE prophylaxis  aspirin 81 mg daily prior to admission, now on Axiomatic study drugs. Continue x 3 mos at discharge  Therapy recommendations:  OP PT, no OT or SLP  Disposition:  Return home  Enrolled in BMS Axiomatic trial. Patient participating in the BMS trial. [facility] will follow up. They will provide stroke antithrombotics for next 3 mos. Please contact them at  [phone] for any questions.    Hypertension  Stable . Long-term BP goal normotensive  Hyperlipidemia  Home meds:  pravachol 40  In hospital on lipitor 80  LDL 73, goal < 70  Ok to  continue home statin at d/c  Diabetes type II Uncontrolled Diabetic retinopathy  HgbA1c 7.8, goal < 7.0  CBGs  SSI  Other Stroke Risk Factors  Advanced age  Former Cigarette smoker  Other Active Problems  Polycythemia, new in Jan 2020 and referred to oncology. Following with them ofr JAK2 mutation testing and erythropoietin level checking  CKD Cr 1.68  Hospital day # 1  I have personally obtained history,examined this patient, reviewed notes, independently viewed imaging studies, participated in medical decision making and plan of care.ROS completed by me personally and pertinent positives fully documented  I have made any additions or clarifications directly to the above note.  He presented with transient slurred speech and facial weakness which fully resolved and MRI was negative for acute stroke.  He has a high ABCD 2 score of 6.  He consented to participation in the [place] stroke trial.  Continue aspirin and Plavix for stroke prevention and aggressive risk factor modification.  Long discussion with patient and Dr. Izetta Dakin and answered questions.  Greater than 50% time during the 25-minute visit was spent on counseling and coordination of care about his TIA and discussion about stroke prevention and answering questions.  Antony Contras, MD Medical Director [facility] Stroke Center Pager: [phone] 06/19/2019 4:33 PM   To contact Stroke Continuity provider, please refer to [email or web address] After hours, contact General Neurology

## 2019-06-19 NOTE — Care Management CC44 (Signed)
Condition Code 44 Documentation Completed  Patient Details  Name: Caleb Cuevas MRN: [id number] Date of Birth: Jan 06, 1946   Condition Code 44 given:  Yes Patient signature on Condition Code 44 notice:  Yes Documentation of 2 MD's agreement:  Yes Code 44 added to claim:  Yes    Marilu Favre, RN 06/19/2019, 1:43 PM

## 2019-06-19 NOTE — Progress Notes (Signed)
Inpatient Diabetes Program Recommendations  AACE/ADA: New Consensus Statement on Inpatient Glycemic Control (2015)  Target Ranges:  Prepandial:   less than 140 mg/dL      Peak postprandial:   less than 180 mg/dL (1-2 hours)      Critically ill patients:  140 - 180 mg/dL   Lab Results  Component Value Date   GLUCAP 115 (H) 06/19/2019   HGBA1C 7.8 (H) 06/18/2019    Review of Glycemic Control Results for Caleb Cuevas, Caleb Cuevas (MRN [id number]) as of 06/19/2019 10:43  Ref. Range 06/19/2019 00:05 06/19/2019 05:02 06/19/2019 05:48  Glucose-Capillary Latest Ref Range: 70 - 99 mg/dL 206 (H) 57 (L) 115 (H)   Diabetes history: Type 2 DM Outpatient Diabetes medications: Victoza 1.2 mg QD, Actos 15 mg QD, Lantus 64 units QHS, Novolog 13/13/15  Current orders for Inpatient glycemic control: Lantus 20 units QHS, Novolog 0-20 units Q4H  Inpatient Diabetes Program Recommendations:    Noted hypoglycemia this am of 57 mg/dL and patient has a diet order.    Consider: - Changing correction to Novolog 0-15 units TID & HS -Adding Novolog 3 units TID (assuming that patient is consuming >50% of meal)    Thanks, Bronson Curb, MSN, RNC-OB Diabetes Coordinator [phone] (8a-5p)

## 2019-06-19 NOTE — Evaluation (Signed)
Occupational Therapy Evaluation Patient Details Name: Caleb Cuevas MRN: [id number] DOB: 1946-07-20 Today's Date: 06/19/2019    History of Present Illness Patient is a 73 year old male with past medical history of polycythemia diabetes type 2, hypertension, hypercholesterolemia was brought in to the emergency this afternoon by the EMS with a chief complaints of slurred speech, left-sided facial droop and difficulty standing that lasted for less than an hour. MRI negative for acute infarct.    Clinical Impression   Patient is a 73 year old male that lives with his spouse, daughter and granddaughter in 2 story home, stays main level with spouse. Patient reports recently has been using his cane more often when outside/unable to furniture cruise and since recent fall using his 4 wheeled walker. Patient furniture cruise during OT session, demonstrates toilet transfer, clothing management and oral care independently. Educate patient on benefits of using walker once returning home to minimize further risk of falls. No further OT indicated in acute setting.     Follow Up Recommendations  No OT follow up;Supervision/Assistance - 24 hour;Other (comment)(patient may benefit from [facility] vs OP PT services for balance)    Equipment Recommendations  None recommended by OT       Precautions / Restrictions Precautions Precautions: Fall Restrictions Weight Bearing Restrictions: No      Mobility Bed Mobility Overal bed mobility: Modified Independent             General bed mobility comments: head of bed elevated, patient has adjustable bed at home  Transfers Overall transfer level: Modified independent Equipment used: None(increase time needed, patient holding onto furniture)             General transfer comment: recommend use of walker at home    Balance Overall balance assessment: Modified Independent                                         ADL either performed or  assessed with clinical judgement   ADL Overall ADL's : At baseline                                       General ADL Comments: patient appears mildly unsteady and is furniture cruising to ambulate to/from bathroom. Independent with toileting and sinkside grooming. recommend using walker at home.      Vision Baseline Vision/History: Wears glasses Wears Glasses: At all times Patient Visual Report: No change from baseline              Pertinent Vitals/Pain Pain Assessment: No/denies pain     Hand Dominance Right   Extremity/Trunk Assessment Upper Extremity Assessment Upper Extremity Assessment: Overall WFL for tasks assessed   Lower Extremity Assessment Lower Extremity Assessment: Defer to PT evaluation       Communication Communication Communication: No difficulties   Cognition Arousal/Alertness: Awake/alert Behavior During Therapy: WFL for tasks assessed/performed Overall Cognitive Status: Within Functional Limits for tasks assessed                                     General Comments  Educate patient on safety with functional ambulation/transfers, recommend using walker as patient still seems little unsure of himself/furniture cruising. Also to take his time and stand  still upon standing before ambulating. Pt verbalize understanding.             Home Living Family/patient expects to be discharged to:: Private residence Living Arrangements: Spouse/significant other;Children Available Help at Discharge: Family;Available 24 hours/day Type of Home: House Home Access: Stairs to enter [employer] of Steps: threshold step to enter   Home Layout: Two level;Able to live on main level with bedroom/bathroom     Bathroom Shower/Tub: Occupational psychologist: Handicapped height Bathroom Accessibility: Yes How Accessible: Accessible via walker Home Equipment: [place] - 4 wheels;Cane - single point;Grab bars -  toilet;Grab bars - tub/shower;Shower seat - built in   Additional Comments: Patients wife, daughter and granddaughter provide assist as needed and transportation assistance      Prior Functioning/Environment Level of Independence: Independent with assistive device(s)        Comments: Patient reports he does have some "bad days" where his wife will help him but usually independent with self care        OT Problem List: Impaired balance (sitting and/or standing)       AM-PAC OT "6 Clicks" Daily Activity     Outcome Measure Help from another person eating meals?: None Help from another person taking care of personal grooming?: None Help from another person toileting, which includes using toliet, bedpan, or urinal?: None Help from another person bathing (including washing, rinsing, drying)?: None Help from another person to put on and taking off regular upper body clothing?: None Help from another person to put on and taking off regular lower body clothing?: None 6 Click Score: 24   End of Session    Activity Tolerance: Patient tolerated treatment well Patient left: in bed;with call bell/phone within reach;with bed alarm set  OT Visit Diagnosis: Unsteadiness on feet (R26.81)                Time: 1224-8250 OT Time Calculation (min): 24 min Charges:  OT General Charges $OT Visit: 1 Visit OT Evaluation $OT Eval Moderate Complexity: 1 Mod OT Treatments $Self Care/Home Management : 8-22 mins  Shon Millet OT OT office: [phone]  Rosemary Holms 06/19/2019, 10:01 AM

## 2019-06-19 NOTE — Discharge Summary (Signed)
Physician Discharge Summary  Patient ID: Caleb Cuevas MRN: [id number] DOB/AGE: [birth year] 73 y.o.  Admit date: 06/18/2019 Discharge date: 06/19/2019  Admission Diagnoses: Left lower Ext Weakness and Fall; slurred speech  Discharge Diagnoses:  Active Problems:   TIA (transient ischemic attack)   Discharged Condition: fair  Hospital Course:  Patient is a 73 y.o. male with past medical history of polycythemia diabetes type 2, hypertension, hypercholesterolemia was brought in to the emergency this afternoon by the EMS with a chief complaints of slurred speech, left-sided facial droop and difficulty standing that lasted for less than an hour.  Once patient's wife noticed those above-mentioned symptoms EMS was called and by the time EMS came to his place his symptoms are almost resolved.  Patient also has been having some gait issues and headache, and patient had a recent fall, over the past few days. Patient denies any chest pain, shortness of breath, palpitation or seizure.  Patient never had a stroke in the past. ED course: Code stroke was called.  CT head was done and blood work was done.  Neurology reviewed the imaging as well as patient's symptoms.  Not consistent with stroke and likely to be TIA.  CT angio of the head and neck and MRI of the brain ordered by neurology.  TIA: Likely TIA as his symptoms mostly resolved within an hour However given patient has history of polycythemia which was a still being worked up by the oncology with whom he had a recent visit is likely patient had a cerebrovascular accident CT head is negative CT angio of the head and neck, MRI of the brain negative for any acute stroke  TTE - essentially WNL  ST - passed the swallow study  - tolerated diet well    lipid panel, TSH, B12, folate --> normal  Consulted  PT OT -- D/C with OP PT; ordered on d/c  -Neurologist Dr. Pearlean Brownie followed the patient.  Patient agreed for study per Dr. Pearlean Brownie.  Patient was given 3  study drugs while inpt per study/rechers protocol.  Neurology cleared patient to be discharged home today with 3 study drugs including aspirin, Plavix, and one other.  Those 3 drugs have already been prescribed by Dr. Pearlean Brownie to the hospital pharmacy.  Those would be brought up and given to the patient prior to discharge by the nurse.  Also prior to patient going home lab technician would come up  draw the blood for the specific blood type test for the study/research purposes.  Patient will be seen in a month in the clinic by the research group. Pt was advised to not to take home ASA either.  Those orders are already given given by Dr. Pearlean Brownie.  Nurse   was called and instructed clearly about the above-mentioned medication and blood draw prior to D/C pt home.    Diabetes mellitus type 2: Started patient sliding scale insulin and long-acting insulin with Accu-Chek before meals at bedtime Holding patient's home p.o. diabetic meds; may resume upon discharge.    hemoglobin A1c 7.8   Polycythemia: Patient was found to have elevated hemoglobin and hematocrit back in January of this year and then he was referred to oncology His hemoglobin back in January was 18.1 with MCV 79.9 and RDW 15.1; today's hemoglobin is found to be 15.8 and hematocrit 47.6 with platelet being 198; on d/c 14.5/43.1 Patient was supposed to undergo JAK2 mutation testing and erythropoietin level checking patient Patient was advised to drink at least 8 glasses of water  a day it is unclear if he followed the recommendation -  IV hydration - Pt is not sure of the las test  - ncourage patient to follow-up with oncology outpatient  CKD: With GFR 46 patient's baseline creatinine is between 1.6-1.8 No acute issue at this time  on admission 1.68 Appears to be in his baseline - f/u OP   Hyperlipidemia: Known to have hyperlipidemia and patient was on a statin at home Lipid panel stable renge  - resume home statin   Consults:  Neurology  Significant Diagnostic Studies: CT angio, ct head and MRI head, Echo   Treatments: per medlist and plan   Discharge Exam: Blood pressure (!) 142/78, pulse 80, temperature 98.5 F (36.9 C), temperature source Oral, resp. rate 18, height 6' (1.829 m), weight 90.7 kg, SpO2 97 %.  PEx:  Constitutional: Appears well-developed and well-nourished.  Eyes: Anicteric sclera HE ENT: Neck supple, atraumatic, ear and nose appropriately positioned; moist mucosa Head: Normocephalic.  Cardiovascular: Normal rate and regular rhythm.  No murmur or gallop appreciated Respiratory: Effort normal, non-labored breathing; clear to auscultation bilaterally GI: Soft. No distension. There is no tenderness.  Skin: WDIwith ulcer on the medial left ankle Neuro: Cranial nerves II to XII grossly intact; strength 4-5 out of 5 in all 4 extremities.  Sensation left lower Ext mildly diminished, but everywhere else grossly intact; deep tendon reflexes 2+ in lower extremities   Disposition: Discharge disposition: 01-Home or Self Care     Cleared by PT/OT. D/C home with OP PT.   Discharge Instructions    Ambulatory referral to Physical Therapy   Complete by: As directed    Iontophoresis - 4 mg/ml of dexamethasone: No   T.E.N.S. Unit Evaluation and Dispense as Indicated: No   Call MD for:  difficulty breathing, headache or visual disturbances   Complete by: As directed    Call MD for:  persistant dizziness or light-headedness   Complete by: As directed    Diet - low sodium heart healthy   Complete by: As directed    Increase activity slowly   Complete by: As directed      Allergies as of 06/19/2019   No Known Allergies     Medication List    STOP taking these medications   aspirin EC 81 MG tablet     TAKE these medications   amLODipine 5 MG tablet Commonly known as: NORVASC Take 5 mg by mouth daily.   DULoxetine 60 MG capsule Commonly known as: CYMBALTA Take 60 mg by mouth 2 (two)  times daily.   ergocalciferol 1.25 MG (50000 UT) capsule Commonly known as: VITAMIN D2 Take 50,000 Units by mouth every Sunday.   furosemide 40 MG tablet Commonly known as: LASIX Take 20 mg by mouth daily.   gabapentin 300 MG capsule Commonly known as: NEURONTIN Take 300 mg by mouth 2 (two) times daily.   insulin glargine 100 unit/mL Sopn Commonly known as: LANTUS Inject 64 Units into the skin at bedtime.   Insulin Pen Needle 32G X 4 MM Misc Commonly known as: BD Pen Needle Nano U/F USE AS DIRECTED TO INJECT VICTOZA SQ QD   liraglutide 18 MG/3ML Sopn Commonly known as: VICTOZA Inject 0.2 mLs (1.2 mg total) into the skin daily.   lisinopril 40 MG tablet Commonly known as: ZESTRIL Take 40 mg by mouth daily.   Meclizine HCl 25 MG Chew Chew 25 mg by mouth 2 (two) times daily as needed (vertigo).   NovoLOG FlexPen 100  UNIT/ML FlexPen Generic drug: insulin aspart Inject 14 Units into the skin 3 (three) times daily with meals. 13 AM -13 Lunch -15 Dinner   pioglitazone 15 MG tablet Commonly known as: Actos Take 1 tablet (15 mg total) by mouth daily.   potassium chloride 8 MEQ tablet Commonly known as: KLOR-CON Take 8 mEq by mouth daily. Take with furosemide   pravastatin 40 MG tablet Commonly known as: PRAVACHOL Take 40 mg by mouth at bedtime.      Follow-up Information    [facility]. Schedule an appointment as soon as possible for a visit.   Specialty: Rehabilitation Contact information: [address] [place] [id number] [place]on [phone]          Signed: Thornell Mule 06/19/2019, 1:25 PM

## 2019-06-19 NOTE — TOC Initial Note (Signed)
Transition of Care [facility]) - Initial/Assessment Note    Patient Details  Name: Caleb Cuevas MRN: [id number] Date of Birth: 05-05-1946  Transition of Care [facility]) CM/SW Contact:    Marilu Favre, RN Phone Number: 06/19/2019, 1:45 PM  Clinical Narrative:                  Patient from home with wife who is at bedside. Discussed OP PT patient and wife in agreement. Referral sent to Neuro Rehab  Expected Discharge Plan: Home/Self Care Barriers to Discharge: No Barriers Identified   Patient Goals and CMS Choice Patient states their goals for this hospitalization and ongoing recovery are:: to go home CMS Medicare.gov Compare Post Acute Care list provided to:: Patient Choice offered to / list presented to : Patient  Expected Discharge Plan and Services Expected Discharge Plan: Home/Self Care       Living arrangements for the past 2 months: Single Family Home Expected Discharge Date: 06/19/19               DME Arranged: N/A         HH Arranged: NA          Prior Living Arrangements/Services Living arrangements for the past 2 months: Single Family Home Lives with:: Spouse Patient language and need for interpreter reviewed:: Yes Do you feel safe going back to the place where you live?: Yes      Need for Family Participation in Patient Care: Yes (Comment) Care giver support system in place?: Yes (comment) Current home services: DME Criminal Activity/Legal Involvement Pertinent to Current Situation/Hospitalization: No - Comment as needed  Activities of Daily Living Home Assistive Devices/Equipment: Gilford Rile (specify type) ADL Screening (condition at time of admission) Patient's cognitive ability adequate to safely complete daily activities?: Yes Is the patient deaf or have difficulty hearing?: No Does the patient have difficulty seeing, even when wearing glasses/contacts?: No Does the patient have difficulty concentrating, remembering, or making decisions?: No Patient  able to express need for assistance with ADLs?: Yes Does the patient have difficulty dressing or bathing?: No Independently performs ADLs?: Yes (appropriate for developmental age)(wife assist minimally per pt) Does the patient have difficulty walking or climbing stairs?: Yes(impaired balance) Weakness of Legs: None Weakness of Arms/Hands: None  Permission Sought/Granted   Permission granted to share information with : No              Emotional Assessment Appearance:: Appears stated age Attitude/Demeanor/Rapport: Engaged Affect (typically observed): Accepting Orientation: : Oriented to Self, Oriented to Place, Oriented to  Time, Oriented to Situation Alcohol / Substance Use: Not Applicable Psych Involvement: No (comment)  Admission diagnosis:  TIA (transient ischemic attack) [G45.9] Patient Active Problem List   Diagnosis Date Noted  . TIA (transient ischemic attack) 06/18/2019  . 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 Leukocytosis 08/24/2018  . Nonproliferative diabetic retinopathy ([place])   . Unstable angina ([place]) 12/25/2017  . Chest pain 12/24/2017  . Diabetes mellitus type 2 in nonobese ([place]) 12/24/2017  . Essential hypertension 12/24/2017  . HLD (hyperlipidemia) 12/24/2017   PCP:  Susy Frizzle, MD Pharmacy:   [facility] [place] [place] - [address] [place] [address] Phone: [phone] Fax: [phone]     Social Determinants of Health (SDOH) Interventions    Readmission Risk Interventions No flowsheet data found.

## 2019-07-08 ENCOUNTER — Other Ambulatory Visit (HOSPITAL_COMMUNITY): Payer: Self-pay | Admitting: Neurology

## 2019-07-08 ENCOUNTER — Telehealth (HOSPITAL_COMMUNITY): Payer: Self-pay | Admitting: Neurology

## 2019-07-08 DIAGNOSIS — M6281 Muscle weakness (generalized): Secondary | ICD-10-CM

## 2019-07-08 NOTE — Telephone Encounter (Signed)
The patient was seen today for BMS stroke trial 21-day study visit.  He and his wife both stated that he had noticed shoulder pain and difficulty in elevation of left shoulder and stated he had a fall 3 days prior to his admission for TIA.  Is also noted weakness in his left hand.  He has not had medical evaluation for this yet.  On exam his shoulder elevation was limited by pain but he did have mild weakness of left grip and intrinsic hand muscles.  Deep tendon reflexes were slightly brisk in the left arm compared to the right. I recommend we check CT scan of the head as well as MRI scan of the cervical spine and EMG nerve conduction study to evaluate his left arm pain and hand weakness further.  He was advised to continue the BMS stroke trial medications.  He and his wife voiced understanding.

## 2019-07-22 ENCOUNTER — Telehealth: Payer: Self-pay

## 2019-07-22 ENCOUNTER — Other Ambulatory Visit (HOSPITAL_COMMUNITY): Payer: Self-pay | Admitting: Neurology

## 2019-07-22 DIAGNOSIS — M6281 Muscle weakness (generalized): Secondary | ICD-10-CM

## 2019-07-22 NOTE — Telephone Encounter (Signed)
I was notified by [facility] in research that pt had not been contacted by our office to schedule his scans.I was advise to call pts daughter Sharyn Lull. I call [place] pts daughter and notify her that it can take 7 to 14 days to get MRI authorized and scheduling. I advise her to call GI to schedule both scans. I offer her the number for GI. She already knew the number and will call to schedule. I advise her per Andee Poles in MRI department the image does not need authorization. She verbalized understanding.

## 2019-09-04 ENCOUNTER — Other Ambulatory Visit (HOSPITAL_COMMUNITY): Payer: Self-pay | Admitting: Neurology

## 2019-09-04 DIAGNOSIS — I6300 Cerebral infarction due to thrombosis of unspecified precerebral artery: Secondary | ICD-10-CM

## 2019-09-14 ENCOUNTER — Ambulatory Visit (HOSPITAL_COMMUNITY): Payer: Non-veteran care

## 2019-09-20 ENCOUNTER — Other Ambulatory Visit: Payer: Self-pay

## 2019-09-20 ENCOUNTER — Ambulatory Visit (HOSPITAL_COMMUNITY)
Admission: RE | Admit: 2019-09-20 | Discharge: 2019-09-20 | Disposition: A | Discharge status: Home / Self Care | Payer: Medicare Other | Source: Ambulatory Visit | Attending: Neurology | Admitting: Neurology

## 2019-09-20 DIAGNOSIS — M6281 Muscle weakness (generalized): Secondary | ICD-10-CM | POA: Diagnosis not present

## 2019-10-06 ENCOUNTER — Other Ambulatory Visit: Payer: Self-pay | Admitting: Podiatry

## 2019-10-06 ENCOUNTER — Other Ambulatory Visit (HOSPITAL_COMMUNITY): Payer: Self-pay | Admitting: Podiatry

## 2019-10-06 DIAGNOSIS — L97321 Non-pressure chronic ulcer of left ankle limited to breakdown of skin: Secondary | ICD-10-CM

## 2019-10-06 NOTE — Progress Notes (Signed)
Kindly inform the patient that stroke study specific MRI scan of the brain showed no new or worrisome abnormalities.

## 2019-10-10 ENCOUNTER — Ambulatory Visit (HOSPITAL_COMMUNITY): Payer: Medicare Other

## 2019-10-10 ENCOUNTER — Encounter (HOSPITAL_COMMUNITY): Payer: Self-pay

## 2019-12-05 ENCOUNTER — Other Ambulatory Visit: Payer: Self-pay

## 2019-12-05 ENCOUNTER — Ambulatory Visit (INDEPENDENT_AMBULATORY_CARE_PROVIDER_SITE_OTHER): Payer: Medicare Other | Admitting: Podiatry

## 2019-12-05 DIAGNOSIS — B351 Tinea unguium: Secondary | ICD-10-CM

## 2019-12-05 DIAGNOSIS — E119 Type 2 diabetes mellitus without complications: Secondary | ICD-10-CM | POA: Diagnosis not present

## 2019-12-05 DIAGNOSIS — M79675 Pain in left toe(s): Secondary | ICD-10-CM | POA: Diagnosis not present

## 2019-12-05 DIAGNOSIS — I739 Peripheral vascular disease, unspecified: Secondary | ICD-10-CM | POA: Diagnosis not present

## 2019-12-05 DIAGNOSIS — M79674 Pain in right toe(s): Secondary | ICD-10-CM | POA: Diagnosis not present

## 2019-12-09 ENCOUNTER — Encounter: Payer: Self-pay | Admitting: Podiatry

## 2019-12-09 ENCOUNTER — Telehealth: Payer: Self-pay | Admitting: *Deleted

## 2019-12-09 DIAGNOSIS — I739 Peripheral vascular disease, unspecified: Secondary | ICD-10-CM

## 2019-12-09 DIAGNOSIS — E119 Type 2 diabetes mellitus without complications: Secondary | ICD-10-CM

## 2019-12-09 NOTE — Progress Notes (Signed)
  Subjective:  Patient ID: Caleb Cuevas, male    DOB: December 01, 1945,  MRN: [id number]  Chief Complaint  Patient presents with  . Foot Pain    pt is here for diabetic foot care, as well as a nail trim   74 y.o. male returns for the above complaint.  Patient presents with complaints of painful thickened elongated dystrophic mycotic toenails x10.  Patient states that they are hard to debride her down as a get thicker.  Patient would like to have them debrided down.  He also has secondary complaint of poor vascular circulation to the lower extremity.  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  He denies any other acute complaints.  He does not have any ulcers or soft tissue loss.  He ambulates in regular sneakers.  Patient is a diabetic with last A1c of 7.8.  Objective:  There were no vitals filed for this visit. Podiatric Exam: Vascular: dorsalis pedis and posterior tibial pulses are non-palpable bilateral. Capillary return is immediate. Temperature gradient is WNL. Skin turgor WNL  Sensorium: Normal Semmes Weinstein monofilament test. Normal tactile sensation bilaterally. Nail Exam: Pt has thick disfigured discolored nails with subungual debris noted bilateral entire nail hallux through fifth toenails Ulcer Exam: There is no evidence of ulcer or pre-ulcerative changes or infection. Orthopedic Exam: Muscle tone and strength are WNL. No limitations in general ROM. No crepitus or effusions noted. HAV  B/L.  Hammer toes 2-5  B/L. Skin: No Porokeratosis. No infection or ulcers  Assessment & Plan:  Patient was evaluated and treated and all questions answered.   Peripheral vascular disease -I explained to the patient the etiology of peripheral vascular disease likely in the setting of diabetes and various treatment options were discussed.  Given that patient has claudication type of symptoms without soft tissue loss I believe he will benefit from ABIs PVRs  for general assessment of the flow to the lower extremity.  If his symptoms gets worse patient will need vascular intervention to help improve the flow to the lower extremity.  For now we will obtain ABIs PVRs and will discuss with him in the future.  Onychomycosis with pain  -Nails palliatively debrided as below. -Educated on self-care  Procedure: Nail Debridement Rationale: pain  Type of Debridement: manual, sharp debridement. Instrumentation: Nail nipper, rotary burr. Number of Nails: 10  Procedures and Treatment: Consent by patient was obtained for treatment procedures. The patient understood the discussion of treatment and procedures well. All questions were answered thoroughly reviewed. Debridement of mycotic and hypertrophic toenails, 1 through 5 bilateral and clearing of subungual debris. No ulceration, no infection noted.  Return Visit-Office Procedure: Patient instructed to return to the office for a follow up visit 3 months for continued evaluation and treatment.  Nicholes Rough, DPM    No follow-ups on file.

## 2019-12-09 NOTE — Telephone Encounter (Signed)
-----   Message from Candelaria Stagers, DPM sent at 12/09/2019  6:21 AM EDT ----- Regarding: ABIs PVRs Hi Lene Mckay,  Can you order ABIs PVRs for this patient to assess the lower flow to the lower extremity.  Thank you

## 2019-12-10 NOTE — Telephone Encounter (Signed)
-----   Message from Kevin P Patel, DPM sent at 12/09/2019  6:21 AM EDT ----- Regarding: ABIs PVRs Hi Roshawna Colclasure,  Can you order ABIs PVRs for this patient to assess the lower flow to the lower extremity.  Thank you  

## 2019-12-11 NOTE — Telephone Encounter (Signed)
Orders faxed to [facility]. 

## 2019-12-19 ENCOUNTER — Ambulatory Visit (HOSPITAL_COMMUNITY)
Admission: RE | Admit: 2019-12-19 | Discharge: 2019-12-19 | Disposition: A | Discharge status: Home / Self Care | Payer: Medicare Other | Source: Ambulatory Visit | Attending: Internal Medicine | Admitting: Internal Medicine

## 2019-12-19 ENCOUNTER — Other Ambulatory Visit: Payer: Self-pay

## 2019-12-19 DIAGNOSIS — I739 Peripheral vascular disease, unspecified: Secondary | ICD-10-CM | POA: Diagnosis not present

## 2019-12-19 DIAGNOSIS — E119 Type 2 diabetes mellitus without complications: Secondary | ICD-10-CM | POA: Diagnosis not present

## 2020-01-02 ENCOUNTER — Other Ambulatory Visit: Payer: Self-pay

## 2020-01-02 ENCOUNTER — Ambulatory Visit (INDEPENDENT_AMBULATORY_CARE_PROVIDER_SITE_OTHER): Payer: Medicare Other | Admitting: Cardiovascular Disease

## 2020-01-02 ENCOUNTER — Encounter: Payer: Self-pay | Admitting: Cardiovascular Disease

## 2020-01-02 VITALS — BP 168/98 | HR 87 | Ht 71.0 in | Wt 207.0 lb

## 2020-01-02 DIAGNOSIS — I739 Peripheral vascular disease, unspecified: Secondary | ICD-10-CM | POA: Diagnosis not present

## 2020-01-02 NOTE — Patient Instructions (Signed)
Medication Instructions:  No changes *If you need a refill on your cardiac medications before your next appointment, please call your pharmacy*   Lab Work: None ordered If you have labs (blood work) drawn today and your tests are completely normal, you will receive your results only by: [name] MyChart Message (if you have MyChart) OR . A paper copy in the mail If you have any lab test that is abnormal or we need to change your treatment, we will call you to review the results.   Testing/Procedures: Your physician has requested that you have a lower extremity arterial duplex. During this test, ultrasound is used to evaluate arterial blood flow in the legs. Allow one hour for this exam. There are no restrictions or special instructions. This will take place at [address] [facility], [address].  Your physician has requested that you have an ankle brachial index (ABI). During this test an ultrasound and blood pressure cuff are used to evaluate the arteries that supply the arms and legs with blood. Allow thirty minutes for this exam. There are no restrictions or special instructions. This will take place at [address] [facility], [address].    Follow-Up: At [facility], you and your health needs are our priority.  As part of our continuing mission to provide you with exceptional heart care, we have created designated Provider Care Teams.  These Care Teams include your primary Cardiologist (physician) and Advanced Practice Providers (APPs -  Physician Assistants and Nurse Practitioners) who all work together to provide you with the care you need, when you need it.  We recommend signing up for the patient portal called "MyChart".  Sign up information is provided on this After Visit Summary.  MyChart is used to connect with patients for Virtual Visits (Telemedicine).  Patients are able to view lab/test results, encounter notes, upcoming appointments, etc.  Non-urgent messages can be sent to your provider as  well.   To learn more about what you can do with MyChart, go to [email or web address].    Your next appointment:   12 month(s)  The format for your next appointment:   In Person  Provider:   You may see Dr. Allyson Sabal or one of the following Advanced Practice Providers on your designated Care Team:    Corine Shelter, PA-C  [place], [place]  Edd Fabian, [place]

## 2020-01-02 NOTE — Progress Notes (Signed)
01/02/2020 Wylene Simmer   06-21-1946  [id number]  Primary Physician Pickard, Cammie Mcgee, MD Primary Cardiologist: Lorretta Harp MD Lupe Carney, [place]  HPI:  Caleb Cuevas is a 74 y.o. mildly overweight married African-American male father 75, grandfather 5 grandchildren is accompanied by his wife Caleb Cuevas today.  He was referred by Dr. Boneta Lucks, his podiatrist for peripheral vascular evaluation.  He has seen Dr. Percival Spanish in the past and has had a cardiac catheterization by Dr. Irish Lack which revealed noncritical disease.  He has a history of treated hypertension, diabetes and hyperlipidemia.  He started smoking 6 months ago 1/2 pack/day after his son committed suicide because of stress.  He is never had a heart attack or stroke.  He denies chest pain or shortness of breath.  He does have a wound on his left medial malleolus which is since healed.  His podiatrist, Dr. Boneta Lucks, ordered Doppler studies on 12/19/2019 revealing a normal right ABI and a left ABI of 0.76 with high-frequency signals in both distal SFAs.   Current Meds  Medication Sig  . amLODipine (NORVASC) 5 MG tablet Take 5 mg by mouth daily.  . clopidogrel (PLAVIX) 75 MG tablet Take 75 mg by mouth daily.  . DULoxetine (CYMBALTA) 60 MG capsule Take 60 mg by mouth 2 (two) times daily.  . ergocalciferol (VITAMIN D2) 50000 units capsule Take 50,000 Units by mouth every Sunday.  . furosemide (LASIX) 40 MG tablet Take 20 mg by mouth daily.  [name] gabapentin (NEURONTIN) 300 MG capsule Take 300 mg by mouth 2 (two) times daily.  . insulin aspart (NOVOLOG FLEXPEN) 100 UNIT/ML FlexPen Inject 14 Units into the skin 3 (three) times daily with meals. 13 AM -[place]  . insulin glargine (LANTUS) 100 unit/mL SOPN Inject 50 Units into the skin 2 (two) times daily.   . Insulin Pen Needle (BD PEN NEEDLE NANO U/F) 32G X 4 MM MISC USE AS DIRECTED TO INJECT VICTOZA SQ QD  . lisinopril (ZESTRIL) 40 MG tablet Take 40 mg by mouth daily.    . Meclizine HCl 25 MG CHEW Chew 25 mg by mouth 2 (two) times daily as needed (vertigo).  . pravastatin (PRAVACHOL) 40 MG tablet Take 40 mg by mouth at bedtime.   . [DISCONTINUED] liraglutide (VICTOZA) 18 MG/3ML SOPN Inject 0.2 mLs (1.2 mg total) into the skin daily.  . [DISCONTINUED] SSD 1 % cream Apply 1 application topically daily.     No Known Allergies  Social History   Socioeconomic History  . Marital status: Married    Spouse name: Not on file  . Number of children: Not on file  . Years of education: Not on file  . Highest education level: Not on file  Occupational History  . Occupation: Retired  Tobacco Use  . Smoking status: Former Smoker    Packs/day: 0.30    Years: 15.00    Pack years: 4.50    Types: Cigarettes    Quit date: 1981    Years since quitting: 40.4  . Smokeless tobacco: Never Used  Substance and Sexual Activity  . Alcohol use: Yes    Comment: occasionally  . Drug use: No  . Sexual activity: Not Currently  Other Topics Concern  . Not on file  Social History Narrative   Moved to [place] from [place] to be near their daughter.   Social Determinants of Health   Financial Resource Strain:   . Difficulty of Paying Living Expenses:  Food Insecurity:   . Worried About Programme researcher, broadcasting/film/video in the Last Year:   . Barista in the Last Year:   Transportation Needs:   . Freight forwarder (Medical):   [name] Lack of Transportation (Non-Medical):   Physical Activity:   . Days of Exercise per Week:   . Minutes of Exercise per Session:   Stress:   . Feeling of Stress :   Social Connections:   . Frequency of Communication with Friends and Family:   . Frequency of Social Gatherings with Friends and Family:   . Attends Religious Services:   . Active Member of Clubs or Organizations:   . Attends Banker Meetings:   [name] Marital Status:   Intimate Partner Violence:   . Fear of Current or Ex-Partner:   . Emotionally Abused:   [name] Physically Abused:    . Sexually Abused:      Review of Systems: General: negative for chills, fever, night sweats or weight changes.  Cardiovascular: negative for chest pain, dyspnea on exertion, edema, orthopnea, palpitations, paroxysmal nocturnal dyspnea or shortness of breath Dermatological: negative for rash Respiratory: negative for cough or wheezing Urologic: negative for hematuria Abdominal: negative for nausea, vomiting, diarrhea, bright red blood per rectum, melena, or hematemesis Neurologic: negative for visual changes, syncope, or dizziness All other systems reviewed and are otherwise negative except as noted above.    Blood pressure (!) 168/98, pulse 87, height 5\' 11"  (1.803 m), weight 207 lb (93.9 kg).  General appearance: alert and no distress Neck: no adenopathy, no carotid bruit, no JVD, supple, symmetrical, trachea midline and thyroid not enlarged, symmetric, no tenderness/mass/nodules Lungs: clear to auscultation bilaterally Heart: regular rate and rhythm, S1, S2 normal, no murmur, click, rub or gallop Extremities: extremities normal, atraumatic, no cyanosis or edema Pulses: Diminished pulses bilaterally Skin: Skin color, texture, turgor normal. No rashes or lesions Neurologic: Alert and oriented X 3, normal strength and tone. Normal symmetric reflexes. Normal coordination and gait  EKG sinus rhythm at 87 with evidence of LVH and nonspecific ST and T wave changes.  Personally reviewed this EKG.  ASSESSMENT AND PLAN:   Peripheral arterial disease [facility]) Mr. Pritt was referred to me by Dr. Manson Passey, his podiatrist for abnormal Doppler studies.  He did have a wound on his left medial malleolus which has since healed.  He denies claudication.  He had Doppler studies performed 12/19/2019 revealing a right ABI of 1.04 and a left of 0.76.  He did have a high-frequency signal in his distal right SFA as well as in his distal left SFA.  At this point, in the absence of a nonhealing wound or  claudication there is no indication to perform an invasive procedure.  We will follow his Dopplers and 12 months and I will see him back after that for further evaluation.      12/21/2019 MD FACP,FACC,FAHA, FSCAI 01/02/2020 12:00 PM

## 2020-01-02 NOTE — Assessment & Plan Note (Signed)
Caleb Cuevas was referred to me by Dr. Allena Katz, his podiatrist for abnormal Doppler studies.  He did have a wound on his left medial malleolus which has since healed.  He denies claudication.  He had Doppler studies performed 12/19/2019 revealing a right ABI of 1.04 and a left of 0.76.  He did have a high-frequency signal in his distal right SFA as well as in his distal left SFA.  At this point, in the absence of a nonhealing wound or claudication there is no indication to perform an invasive procedure.  We will follow his Dopplers and 12 months and I will see him back after that for further evaluation.

## 2020-01-15 ENCOUNTER — Other Ambulatory Visit: Payer: Self-pay | Admitting: Neurology

## 2020-01-26 ENCOUNTER — Other Ambulatory Visit: Payer: Self-pay

## 2020-01-26 ENCOUNTER — Other Ambulatory Visit: Payer: Self-pay | Admitting: Neurology

## 2020-01-26 MED ORDER — CLOPIDOGREL BISULFATE 75 MG PO TABS: 75.0000 mg | ORAL_TABLET | Freq: Every day | ORAL | 1 refills | Status: DC

## 2020-02-12 ENCOUNTER — Telehealth: Payer: Self-pay | Admitting: Neurology

## 2020-02-12 NOTE — Telephone Encounter (Signed)
Zulauf,[name](wife) has called asking Sheilah Mins to send a Rx as a fax to the [place] so pt can get the medication clopidogrel (PLAVIX) 75 MG tablet thru mail order and not have to go to [place].  Pt wife is asking the fax be sent to [phone]

## 2020-02-13 NOTE — Telephone Encounter (Signed)
Message sent to Dr.Sethi for refill. Patient was in research study.

## 2020-02-16 ENCOUNTER — Other Ambulatory Visit: Payer: Self-pay

## 2020-02-16 MED ORDER — CLOPIDOGREL BISULFATE 75 MG PO TABS: 75.0000 mg | ORAL_TABLET | Freq: Every day | ORAL | 1 refills | Status: DC

## 2020-02-16 NOTE — Telephone Encounter (Signed)
Prescription printed for Dr.Sethi to sign. Pt will need office visit. Pt is no longer in research study.

## 2020-02-17 NOTE — Telephone Encounter (Signed)
If patient or his wife call back please schedule him an appointment with Dr. Pearlean Brownie in the next 3 months. Pt has finish research study at [facility] and needs to be seen in the office. Dr.Sethi sent a 6 month refill of Plavix. Pt needs an office visit    It was fax to [place] at [phone] twice and confirmed.  I left a detail voice mail on wife listed number with the above message.

## 2020-03-05 ENCOUNTER — Ambulatory Visit: Payer: Medicare Other | Admitting: Podiatry

## 2020-05-05 DIAGNOSIS — H2511 Age-related nuclear cataract, right eye: Secondary | ICD-10-CM | POA: Diagnosis not present

## 2020-07-25 ENCOUNTER — Other Ambulatory Visit: Payer: Self-pay | Admitting: Neurology

## 2020-08-04 ENCOUNTER — Other Ambulatory Visit: Payer: Self-pay | Admitting: Neurology

## 2020-08-16 ENCOUNTER — Telehealth: Payer: Self-pay | Admitting: Neurology

## 2020-08-16 ENCOUNTER — Other Ambulatory Visit: Payer: Self-pay | Admitting: Neurology

## 2020-08-16 NOTE — Telephone Encounter (Signed)
Pt is needing a refill on his clopidogrel (PLAVIX) 75 MG tablet sent in to the [facility] on [address].

## 2020-08-17 NOTE — Telephone Encounter (Signed)
Called patient and let them know that his refill for Plavix was called in to that [facility] on 12/29.  Patient expressed appreciation.

## 2020-09-02 ENCOUNTER — Encounter: Payer: Self-pay | Admitting: Adult Health

## 2020-09-02 ENCOUNTER — Ambulatory Visit: Payer: Medicare Other | Admitting: Adult Health

## 2020-09-02 VITALS — BP 168/82 | HR 89 | Ht 71.0 in | Wt 219.0 lb

## 2020-09-02 DIAGNOSIS — I10 Essential (primary) hypertension: Secondary | ICD-10-CM

## 2020-09-02 DIAGNOSIS — G459 Transient cerebral ischemic attack, unspecified: Secondary | ICD-10-CM | POA: Diagnosis not present

## 2020-09-02 DIAGNOSIS — E119 Type 2 diabetes mellitus without complications: Secondary | ICD-10-CM | POA: Diagnosis not present

## 2020-09-02 DIAGNOSIS — E785 Hyperlipidemia, unspecified: Secondary | ICD-10-CM | POA: Diagnosis not present

## 2020-09-02 MED ORDER — CLOPIDOGREL BISULFATE 75 MG PO TABS: 75.0000 mg | ORAL_TABLET | Freq: Every day | ORAL | 3 refills | Status: DC
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
## 2020-09-02 NOTE — Patient Instructions (Signed)
Continue clopidogrel 75 mg daily  and pravastatin  for secondary stroke prevention  Consider further evaluation for sleep apnea - if you would like to pursue testing, please let me know  Continue to follow up with PCP regarding cholesterol, blood pressure and diabetes management  Maintain strict control of hypertension with blood pressure goal below 130/90, diabetes with hemoglobin A1c goal below 7% and cholesterol with LDL cholesterol (bad cholesterol) goal below 70 mg/dL.      Followup in the future with me in 1 year or call earlier if needed      Thank you for coming to see [place] at [facility]. I hope we have been able to provide you high quality care today.  You may receive a patient satisfaction survey over the next few weeks. We would appreciate your feedback and comments so that we may continue to improve ourselves and the health of our patients.

## 2020-09-03 LAB — LIPID PANEL
Chol/HDL Ratio: 5 ratio (ref 0.0–5.0)
Cholesterol, Total: 150 mg/dL (ref 100–199)
HDL: 30 mg/dL — ABNORMAL LOW (ref >39)
LDL Chol Calc (NIH): 83 mg/dL (ref 0–99)
Triglycerides: 219 mg/dL — ABNORMAL HIGH (ref 0–149)
VLDL Cholesterol Cal: 37 mg/dL (ref 5–40)

## 2020-09-03 LAB — HEMOGLOBIN A1C
Est. average glucose Bld gHb Est-mCnc: 243 mg/dL
Hgb A1c MFr Bld: 10.1 % — ABNORMAL HIGH (ref 4.8–5.6)

## 2020-09-06 NOTE — Progress Notes (Signed)
I agree with the above plan 

## 2020-09-07 ENCOUNTER — Ambulatory Visit: Payer: Self-pay | Admitting: Adult Health

## 2020-12-23 ENCOUNTER — Inpatient Hospital Stay (HOSPITAL_COMMUNITY): Admission: RE | Admit: 2020-12-23 | Payer: Medicare Other | Source: Ambulatory Visit

## 2021-01-01 DIAGNOSIS — R059 Cough, unspecified: Secondary | ICD-10-CM | POA: Diagnosis not present

## 2021-01-01 DIAGNOSIS — I10 Essential (primary) hypertension: Secondary | ICD-10-CM | POA: Diagnosis not present

## 2021-02-13 ENCOUNTER — Encounter (HOSPITAL_COMMUNITY): Payer: Self-pay

## 2021-02-13 ENCOUNTER — Emergency Department (HOSPITAL_COMMUNITY): Payer: No Typology Code available for payment source

## 2021-02-13 ENCOUNTER — Inpatient Hospital Stay (HOSPITAL_COMMUNITY)
Admission: EM | Admit: 2021-02-13 | Discharge: 2021-02-17 | DRG: 291 | Disposition: A | Discharge status: Home / Self Care | Payer: No Typology Code available for payment source | Attending: Internal Medicine | Admitting: Internal Medicine

## 2021-02-13 DIAGNOSIS — G4733 Obstructive sleep apnea (adult) (pediatric): Secondary | ICD-10-CM

## 2021-02-13 DIAGNOSIS — R404 Transient alteration of awareness: Secondary | ICD-10-CM | POA: Diagnosis not present

## 2021-02-13 DIAGNOSIS — Z743 Need for continuous supervision: Secondary | ICD-10-CM | POA: Diagnosis not present

## 2021-02-13 DIAGNOSIS — I130 Hypertensive heart and chronic kidney disease with heart failure and stage 1 through stage 4 chronic kidney disease, or unspecified chronic kidney disease: Principal | ICD-10-CM | POA: Diagnosis present

## 2021-02-13 DIAGNOSIS — E785 Hyperlipidemia, unspecified: Secondary | ICD-10-CM | POA: Diagnosis present

## 2021-02-13 DIAGNOSIS — I2510 Atherosclerotic heart disease of native coronary artery without angina pectoris: Secondary | ICD-10-CM | POA: Diagnosis present

## 2021-02-13 DIAGNOSIS — R0689 Other abnormalities of breathing: Secondary | ICD-10-CM | POA: Diagnosis not present

## 2021-02-13 DIAGNOSIS — Z87891 Personal history of nicotine dependence: Secondary | ICD-10-CM

## 2021-02-13 DIAGNOSIS — N183 Chronic kidney disease, stage 3 unspecified: Secondary | ICD-10-CM | POA: Diagnosis present

## 2021-02-13 DIAGNOSIS — E872 Acidosis: Secondary | ICD-10-CM | POA: Diagnosis present

## 2021-02-13 DIAGNOSIS — Z79899 Other long term (current) drug therapy: Secondary | ICD-10-CM

## 2021-02-13 DIAGNOSIS — Z20822 Contact with and (suspected) exposure to covid-19: Secondary | ICD-10-CM | POA: Diagnosis present

## 2021-02-13 DIAGNOSIS — I10 Essential (primary) hypertension: Secondary | ICD-10-CM | POA: Diagnosis not present

## 2021-02-13 DIAGNOSIS — E1151 Type 2 diabetes mellitus with diabetic peripheral angiopathy without gangrene: Secondary | ICD-10-CM | POA: Diagnosis present

## 2021-02-13 DIAGNOSIS — N1832 Chronic kidney disease, stage 3b: Secondary | ICD-10-CM | POA: Diagnosis present

## 2021-02-13 DIAGNOSIS — E119 Type 2 diabetes mellitus without complications: Secondary | ICD-10-CM

## 2021-02-13 DIAGNOSIS — I480 Paroxysmal atrial fibrillation: Secondary | ICD-10-CM | POA: Diagnosis present

## 2021-02-13 DIAGNOSIS — I509 Heart failure, unspecified: Secondary | ICD-10-CM

## 2021-02-13 DIAGNOSIS — Z7902 Long term (current) use of antithrombotics/antiplatelets: Secondary | ICD-10-CM

## 2021-02-13 DIAGNOSIS — R0902 Hypoxemia: Secondary | ICD-10-CM

## 2021-02-13 DIAGNOSIS — I4719 Other supraventricular tachycardia: Secondary | ICD-10-CM | POA: Diagnosis present

## 2021-02-13 DIAGNOSIS — I5043 Acute on chronic combined systolic (congestive) and diastolic (congestive) heart failure: Secondary | ICD-10-CM | POA: Diagnosis present

## 2021-02-13 DIAGNOSIS — J810 Acute pulmonary edema: Secondary | ICD-10-CM

## 2021-02-13 DIAGNOSIS — I4892 Unspecified atrial flutter: Secondary | ICD-10-CM

## 2021-02-13 DIAGNOSIS — J9601 Acute respiratory failure with hypoxia: Secondary | ICD-10-CM | POA: Diagnosis present

## 2021-02-13 DIAGNOSIS — R0602 Shortness of breath: Secondary | ICD-10-CM | POA: Diagnosis not present

## 2021-02-13 DIAGNOSIS — E1122 Type 2 diabetes mellitus with diabetic chronic kidney disease: Secondary | ICD-10-CM | POA: Diagnosis present

## 2021-02-13 DIAGNOSIS — R092 Respiratory arrest: Secondary | ICD-10-CM

## 2021-02-13 DIAGNOSIS — I4891 Unspecified atrial fibrillation: Secondary | ICD-10-CM

## 2021-02-13 DIAGNOSIS — R6889 Other general symptoms and signs: Secondary | ICD-10-CM | POA: Diagnosis not present

## 2021-02-13 DIAGNOSIS — I471 Supraventricular tachycardia: Secondary | ICD-10-CM

## 2021-02-13 DIAGNOSIS — E7800 Pure hypercholesterolemia, unspecified: Secondary | ICD-10-CM

## 2021-02-13 DIAGNOSIS — I5031 Acute diastolic (congestive) heart failure: Secondary | ICD-10-CM

## 2021-02-13 DIAGNOSIS — M159 Polyosteoarthritis, unspecified: Secondary | ICD-10-CM | POA: Diagnosis present

## 2021-02-13 DIAGNOSIS — Z8673 Personal history of transient ischemic attack (TIA), and cerebral infarction without residual deficits: Secondary | ICD-10-CM

## 2021-02-13 DIAGNOSIS — J441 Chronic obstructive pulmonary disease with (acute) exacerbation: Secondary | ICD-10-CM | POA: Diagnosis present

## 2021-02-13 DIAGNOSIS — I499 Cardiac arrhythmia, unspecified: Secondary | ICD-10-CM | POA: Diagnosis not present

## 2021-02-13 DIAGNOSIS — Z833 Family history of diabetes mellitus: Secondary | ICD-10-CM

## 2021-02-13 DIAGNOSIS — Z794 Long term (current) use of insulin: Secondary | ICD-10-CM

## 2021-02-13 LAB — I-STAT ARTERIAL BLOOD GAS, ED
Acid-Base Excess: 2 mmol/L (ref 0.0–2.0)
Bicarbonate: 28.1 mmol/L — ABNORMAL HIGH (ref 20.0–28.0)
Calcium, Ion: 1.15 mmol/L (ref 1.15–1.40)
HCT: 38 % — ABNORMAL LOW (ref 39.0–52.0)
Hemoglobin: 12.9 g/dL — ABNORMAL LOW (ref 13.0–17.0)
O2 Saturation: 97 %
Patient temperature: 98.1
Potassium: 3.3 mmol/L — ABNORMAL LOW (ref 3.5–5.1)
Sodium: 140 mmol/L (ref 135–145)
TCO2: 30 mmol/L (ref 22–32)
pCO2 arterial: 50.1 mmHg — ABNORMAL HIGH (ref 32.0–48.0)
pH, Arterial: 7.356 (ref 7.350–7.450)
pO2, Arterial: 91 mmHg (ref 83.0–108.0)

## 2021-02-13 LAB — CBC WITH DIFFERENTIAL/PLATELET
Abs Immature Granulocytes: 0.06 10*3/uL (ref 0.00–0.07)
Basophils Absolute: 0 10*3/uL (ref 0.0–0.1)
Basophils Relative: 0 %
Eosinophils Absolute: 0.3 10*3/uL (ref 0.0–0.5)
Eosinophils Relative: 3 %
HCT: 42.5 % (ref 39.0–52.0)
Hemoglobin: 13.6 g/dL (ref 13.0–17.0)
Immature Granulocytes: 1 %
Lymphocytes Relative: 23 %
Lymphs Abs: 2.5 10*3/uL (ref 0.7–4.0)
MCH: 28.2 pg (ref 26.0–34.0)
MCHC: 32 g/dL (ref 30.0–36.0)
MCV: 88 fL (ref 80.0–100.0)
Monocytes Absolute: 0.8 10*3/uL (ref 0.1–1.0)
Monocytes Relative: 8 %
Neutro Abs: 6.9 10*3/uL (ref 1.7–7.7)
Neutrophils Relative %: 65 %
Platelets: 179 10*3/uL (ref 150–400)
RBC: 4.83 MIL/uL (ref 4.22–5.81)
RDW: 13.9 % (ref 11.5–15.5)
WBC: 10.6 10*3/uL — ABNORMAL HIGH (ref 4.0–10.5)
nRBC: 0 % (ref 0.0–0.2)

## 2021-02-13 LAB — BASIC METABOLIC PANEL
Anion gap: 10 (ref 5–15)
BUN: 16 mg/dL (ref 8–23)
CO2: 27 mmol/L (ref 22–32)
Calcium: 8 mg/dL — ABNORMAL LOW (ref 8.9–10.3)
Chloride: 102 mmol/L (ref 98–111)
Creatinine, Ser: 1.87 mg/dL — ABNORMAL HIGH (ref 0.61–1.24)
GFR, Estimated: 37 mL/min — ABNORMAL LOW (ref >60)
Glucose, Bld: 202 mg/dL — ABNORMAL HIGH (ref 70–99)
Potassium: 3.3 mmol/L — ABNORMAL LOW (ref 3.5–5.1)
Sodium: 139 mmol/L (ref 135–145)

## 2021-02-13 LAB — RESP PANEL BY RT-PCR (FLU A&B, COVID) ARPGX2
Influenza A by PCR: NEGATIVE
Influenza B by PCR: NEGATIVE
SARS Coronavirus 2 by RT PCR: NEGATIVE

## 2021-02-13 LAB — LACTIC ACID, PLASMA
Lactic Acid, Venous: 2.9 mmol/L (ref 0.5–1.9)
Lactic Acid, Venous: 1.5 mmol/L (ref 0.5–1.9)

## 2021-02-13 LAB — TROPONIN I (HIGH SENSITIVITY)
Troponin I (High Sensitivity): 45 ng/L — ABNORMAL HIGH (ref <18)
Troponin I (High Sensitivity): 53 ng/L — ABNORMAL HIGH (ref <18)

## 2021-02-13 LAB — BRAIN NATRIURETIC PEPTIDE: B Natriuretic Peptide: 204.4 pg/mL — ABNORMAL HIGH (ref 0.0–100.0)

## 2021-02-13 MED ORDER — CLOPIDOGREL BISULFATE 75 MG PO TABS
75.0000 mg | ORAL_TABLET | Freq: Every day | ORAL | Status: DC
  Administered 2021-02-14 – 2021-02-17 (×4): 75 mg via ORAL
  Filled 2021-02-13 (×4): qty 1

## 2021-02-13 MED ORDER — METOPROLOL TARTRATE 25 MG PO TABS
25.0000 mg | ORAL_TABLET | Freq: Two times a day (BID) | ORAL | Status: DC
  Administered 2021-02-13 – 2021-02-17 (×8): 25 mg via ORAL
  Filled 2021-02-13 (×8): qty 1

## 2021-02-13 MED ORDER — ENOXAPARIN SODIUM 40 MG/0.4ML IJ SOSY
40.0000 mg | PREFILLED_SYRINGE | INTRAMUSCULAR | Status: DC
  Administered 2021-02-13: 40 mg via SUBCUTANEOUS
  Filled 2021-02-13: qty 0.4

## 2021-02-13 MED ORDER — FUROSEMIDE 10 MG/ML IJ SOLN
60.0000 mg | Freq: Once | INTRAMUSCULAR | Status: AC
  Administered 2021-02-13: 60 mg via INTRAVENOUS
  Filled 2021-02-13: qty 6

## 2021-02-13 MED ORDER — LISINOPRIL 40 MG PO TABS
40.0000 mg | ORAL_TABLET | Freq: Every day | ORAL | Status: DC
  Administered 2021-02-14 – 2021-02-15 (×2): 40 mg via ORAL
  Filled 2021-02-13: qty 2
  Filled 2021-02-13: qty 1

## 2021-02-13 MED ORDER — MECLIZINE HCL 25 MG PO TABS: 25.0000 mg | ORAL_TABLET | Freq: Two times a day (BID) | ORAL | Status: DC | PRN

## 2021-02-13 MED ORDER — PRAVASTATIN SODIUM 40 MG PO TABS
80.0000 mg | ORAL_TABLET | Freq: Every day | ORAL | Status: DC
  Administered 2021-02-13 – 2021-02-16 (×4): 80 mg via ORAL
  Filled 2021-02-13 (×4): qty 2

## 2021-02-13 MED ORDER — INSULIN GLARGINE 100 UNIT/ML ~~LOC~~ SOLN
40.0000 [IU] | Freq: Every day | SUBCUTANEOUS | Status: DC
  Administered 2021-02-14 – 2021-02-16 (×3): 40 [IU] via SUBCUTANEOUS
  Filled 2021-02-13 (×5): qty 0.4

## 2021-02-13 MED ORDER — IOHEXOL 350 MG/ML SOLN
50.0000 mL | Freq: Once | INTRAVENOUS | Status: AC | PRN
  Administered 2021-02-13: 50 mL via INTRAVENOUS

## 2021-02-13 MED ORDER — INSULIN ASPART 100 UNIT/ML IJ SOLN
6.0000 [IU] | Freq: Three times a day (TID) | INTRAMUSCULAR | Status: DC
  Administered 2021-02-14 – 2021-02-17 (×6): 6 [IU] via SUBCUTANEOUS

## 2021-02-13 MED ORDER — SODIUM CHLORIDE 0.9 % IV SOLN: 250.0000 mL | INTRAVENOUS | Status: DC | PRN

## 2021-02-13 MED ORDER — POTASSIUM CHLORIDE CRYS ER 20 MEQ PO TBCR
40.0000 meq | EXTENDED_RELEASE_TABLET | Freq: Once | ORAL | Status: AC
  Administered 2021-02-13: 40 meq via ORAL

## 2021-02-13 MED ORDER — INSULIN ASPART 100 UNIT/ML IJ SOLN
0.0000 [IU] | Freq: Three times a day (TID) | INTRAMUSCULAR | Status: DC
  Administered 2021-02-14: 3 [IU] via SUBCUTANEOUS
  Administered 2021-02-15: 8 [IU] via SUBCUTANEOUS
  Administered 2021-02-15: 2 [IU] via SUBCUTANEOUS
  Administered 2021-02-16: 3 [IU] via SUBCUTANEOUS

## 2021-02-13 MED ORDER — ONDANSETRON HCL 4 MG/2ML IJ SOLN: 4.0000 mg | Freq: Four times a day (QID) | INTRAMUSCULAR | Status: DC | PRN

## 2021-02-13 MED ORDER — SODIUM CHLORIDE 0.9% FLUSH: 3.0000 mL | INTRAVENOUS | Status: DC | PRN

## 2021-02-13 MED ORDER — SODIUM CHLORIDE 0.9% FLUSH
3.0000 mL | Freq: Two times a day (BID) | INTRAVENOUS | Status: DC
  Administered 2021-02-13 – 2021-02-17 (×8): 3 mL via INTRAVENOUS

## 2021-02-13 MED ORDER — FUROSEMIDE 10 MG/ML IJ SOLN
40.0000 mg | Freq: Two times a day (BID) | INTRAMUSCULAR | Status: DC
  Administered 2021-02-14 (×2): 40 mg via INTRAVENOUS
  Filled 2021-02-13 (×2): qty 4

## 2021-02-13 MED ORDER — ACETAMINOPHEN 325 MG PO TABS: 650.0000 mg | ORAL_TABLET | ORAL | Status: DC | PRN

## 2021-02-13 NOTE — ED Notes (Signed)
Pt's belongings at bedside. Shoes, pants, and shirt in belongings bag.

## 2021-02-13 NOTE — ED Triage Notes (Signed)
Pt BIB [facility] as Resp Arrest- called out for [facility], noted to be snoring resp to apneic on arrival. 10-11min downtime (approx [id number]), tachy in 200's, cardioverted @200j , given 10mg  cardizem brought HR between 84-150's, 90's & sinus on arrival  [facility] EDP Belfi at bedside on arrival
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
## 2021-02-13 NOTE — ED Notes (Signed)
Pt noted to have apneic episode on 4L [place], RT to place pt on CPAP
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
## 2021-02-13 NOTE — Progress Notes (Signed)
Patient arrived via EMS on NRB mask.  Patient sats were 100% on arrival.  Placed patient on 4L [facility] [place] with end-tidal reading at 30.  Will continue to monitor.
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
## 2021-02-13 NOTE — ED Notes (Signed)
Pt transitioned to 4L [place], tolerating well

## 2021-02-13 NOTE — Progress Notes (Signed)
RT walked by patient room and noted that patient end-tidal had went flatline and patient was reading apneic.  Patient was able to be aroused.  Patient states that he does not wear a CPAP at home but he does snore.  Patient placed on CPAP with auto settings of max 20 and min 8 with 8L bleed in.  ABG ordered by MD and is currently being obtained.  Patient tolerating well at this time.

## 2021-02-14 ENCOUNTER — Inpatient Hospital Stay (HOSPITAL_COMMUNITY): Payer: No Typology Code available for payment source

## 2021-02-14 ENCOUNTER — Encounter (HOSPITAL_COMMUNITY): Payer: Self-pay | Admitting: Internal Medicine

## 2021-02-14 ENCOUNTER — Other Ambulatory Visit: Payer: Self-pay

## 2021-02-14 DIAGNOSIS — I130 Hypertensive heart and chronic kidney disease with heart failure and stage 1 through stage 4 chronic kidney disease, or unspecified chronic kidney disease: Secondary | ICD-10-CM | POA: Diagnosis present

## 2021-02-14 DIAGNOSIS — M159 Polyosteoarthritis, unspecified: Secondary | ICD-10-CM | POA: Diagnosis present

## 2021-02-14 DIAGNOSIS — J810 Acute pulmonary edema: Secondary | ICD-10-CM | POA: Diagnosis not present

## 2021-02-14 DIAGNOSIS — G4733 Obstructive sleep apnea (adult) (pediatric): Secondary | ICD-10-CM | POA: Diagnosis present

## 2021-02-14 DIAGNOSIS — E872 Acidosis: Secondary | ICD-10-CM | POA: Diagnosis present

## 2021-02-14 DIAGNOSIS — I2510 Atherosclerotic heart disease of native coronary artery without angina pectoris: Secondary | ICD-10-CM | POA: Diagnosis present

## 2021-02-14 DIAGNOSIS — Z87891 Personal history of nicotine dependence: Secondary | ICD-10-CM | POA: Diagnosis not present

## 2021-02-14 DIAGNOSIS — I4892 Unspecified atrial flutter: Secondary | ICD-10-CM

## 2021-02-14 DIAGNOSIS — I5023 Acute on chronic systolic (congestive) heart failure: Secondary | ICD-10-CM

## 2021-02-14 DIAGNOSIS — Z20822 Contact with and (suspected) exposure to covid-19: Secondary | ICD-10-CM | POA: Diagnosis present

## 2021-02-14 DIAGNOSIS — E1151 Type 2 diabetes mellitus with diabetic peripheral angiopathy without gangrene: Secondary | ICD-10-CM | POA: Diagnosis present

## 2021-02-14 DIAGNOSIS — E7800 Pure hypercholesterolemia, unspecified: Secondary | ICD-10-CM | POA: Diagnosis present

## 2021-02-14 DIAGNOSIS — Z7902 Long term (current) use of antithrombotics/antiplatelets: Secondary | ICD-10-CM | POA: Diagnosis not present

## 2021-02-14 DIAGNOSIS — R092 Respiratory arrest: Secondary | ICD-10-CM | POA: Insufficient documentation

## 2021-02-14 DIAGNOSIS — I5043 Acute on chronic combined systolic (congestive) and diastolic (congestive) heart failure: Secondary | ICD-10-CM | POA: Diagnosis present

## 2021-02-14 DIAGNOSIS — I471 Supraventricular tachycardia: Secondary | ICD-10-CM | POA: Diagnosis present

## 2021-02-14 DIAGNOSIS — R0902 Hypoxemia: Secondary | ICD-10-CM

## 2021-02-14 DIAGNOSIS — Z8673 Personal history of transient ischemic attack (TIA), and cerebral infarction without residual deficits: Secondary | ICD-10-CM | POA: Diagnosis not present

## 2021-02-14 DIAGNOSIS — Z794 Long term (current) use of insulin: Secondary | ICD-10-CM | POA: Diagnosis not present

## 2021-02-14 DIAGNOSIS — J441 Chronic obstructive pulmonary disease with (acute) exacerbation: Secondary | ICD-10-CM | POA: Diagnosis present

## 2021-02-14 DIAGNOSIS — Z833 Family history of diabetes mellitus: Secondary | ICD-10-CM | POA: Diagnosis not present

## 2021-02-14 DIAGNOSIS — I5031 Acute diastolic (congestive) heart failure: Secondary | ICD-10-CM

## 2021-02-14 DIAGNOSIS — E1122 Type 2 diabetes mellitus with diabetic chronic kidney disease: Secondary | ICD-10-CM | POA: Diagnosis present

## 2021-02-14 DIAGNOSIS — R0602 Shortness of breath: Secondary | ICD-10-CM | POA: Diagnosis present

## 2021-02-14 DIAGNOSIS — J9601 Acute respiratory failure with hypoxia: Secondary | ICD-10-CM | POA: Diagnosis present

## 2021-02-14 DIAGNOSIS — N1832 Chronic kidney disease, stage 3b: Secondary | ICD-10-CM | POA: Diagnosis present

## 2021-02-14 DIAGNOSIS — N183 Chronic kidney disease, stage 3 unspecified: Secondary | ICD-10-CM | POA: Diagnosis not present

## 2021-02-14 DIAGNOSIS — I5021 Acute systolic (congestive) heart failure: Secondary | ICD-10-CM | POA: Diagnosis not present

## 2021-02-14 DIAGNOSIS — I4891 Unspecified atrial fibrillation: Secondary | ICD-10-CM

## 2021-02-14 DIAGNOSIS — I480 Paroxysmal atrial fibrillation: Secondary | ICD-10-CM | POA: Diagnosis present

## 2021-02-14 DIAGNOSIS — Z79899 Other long term (current) drug therapy: Secondary | ICD-10-CM | POA: Diagnosis not present

## 2021-02-14 HISTORY — DX: Obstructive sleep apnea (adult) (pediatric): G47.33

## 2021-02-14 HISTORY — DX: Hypoxemia: R09.02

## 2021-02-14 HISTORY — DX: Acute diastolic (congestive) heart failure: I50.31

## 2021-02-14 HISTORY — DX: Unspecified atrial fibrillation: I48.91

## 2021-02-14 HISTORY — DX: Unspecified atrial flutter: I48.92

## 2021-02-14 LAB — BASIC METABOLIC PANEL
Anion gap: 9 (ref 5–15)
BUN: 16 mg/dL (ref 8–23)
CO2: 28 mmol/L (ref 22–32)
Calcium: 8.2 mg/dL — ABNORMAL LOW (ref 8.9–10.3)
Chloride: 100 mmol/L (ref 98–111)
Creatinine, Ser: 1.83 mg/dL — ABNORMAL HIGH (ref 0.61–1.24)
GFR, Estimated: 38 mL/min — ABNORMAL LOW (ref >60)
Glucose, Bld: 92 mg/dL (ref 70–99)
Potassium: 3.9 mmol/L (ref 3.5–5.1)
Sodium: 137 mmol/L (ref 135–145)

## 2021-02-14 LAB — ECHOCARDIOGRAM COMPLETE
Area-P 1/2: 3.6 cm2
Height: 71 in
S' Lateral: 2.8 cm
Weight: 3520 oz

## 2021-02-14 LAB — CBG MONITORING, ED
Glucose-Capillary: 89 mg/dL (ref 70–99)
Glucose-Capillary: 133 mg/dL — ABNORMAL HIGH (ref 70–99)
Glucose-Capillary: 149 mg/dL — ABNORMAL HIGH (ref 70–99)

## 2021-02-14 LAB — GLUCOSE, CAPILLARY: Glucose-Capillary: 111 mg/dL — ABNORMAL HIGH (ref 70–99)

## 2021-02-14 MED ORDER — APIXABAN 5 MG PO TABS
5.0000 mg | ORAL_TABLET | Freq: Two times a day (BID) | ORAL | Status: DC
  Administered 2021-02-14 – 2021-02-17 (×7): 5 mg via ORAL
  Filled 2021-02-14 (×7): qty 1

## 2021-02-14 MED ORDER — PREDNISONE 20 MG PO TABS
40.0000 mg | ORAL_TABLET | Freq: Every day | ORAL | Status: DC
  Administered 2021-02-14 – 2021-02-17 (×4): 40 mg via ORAL
  Filled 2021-02-14 (×4): qty 2

## 2021-02-14 MED ORDER — IPRATROPIUM-ALBUTEROL 0.5-2.5 (3) MG/3ML IN SOLN
3.0000 mL | Freq: Four times a day (QID) | RESPIRATORY_TRACT | Status: DC
  Administered 2021-02-14 – 2021-02-16 (×7): 3 mL via RESPIRATORY_TRACT
  Filled 2021-02-14 (×9): qty 3

## 2021-02-14 NOTE — Progress Notes (Signed)
  Echocardiogram 2D Echocardiogram has been performed.  Caleb Cuevas G Caleb Cuevas 02/14/2021, 11:05 AM

## 2021-02-14 NOTE — Progress Notes (Signed)
DAILY PROGRESS NOTE   Patient Name: Caleb Cuevas Date of Encounter: 02/14/2021 Cardiologist: None  Chief Complaint   Breathing better  Patient Profile   Caleb Cuevas is a 75 y.o. 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  Subjective   Noted to have apnea overnight by RT - placed on CPAP.  Apparently he is no known history of apnea.  I's and O's not recorded. Weight yesterday 99.8 kg.  He is maintaining sinus rhythm.  He says he feels better today.  He denies any chest pain or shortness of breath.  Objective   Vitals:   02/14/21 0200 02/14/21 0215 02/14/21 0340 02/14/21 0606  BP: (!) 151/80 (!) 164/79 (!) 151/76 (!) 163/79  Pulse: 67 71 68 80  Resp: Temp:   97.9 F (36.6 C)   TempSrc:   Temporal   SpO2: 100% 99% 100% 98%  Weight:      Height:       No intake or output data in the 24 hours ending 02/14/21 0833 Filed Weights   02/13/21 1630  Weight: 99.8 kg    Physical Exam   General appearance: alert and no distress Neck: JVD - 3 cm above sternal notch, no carotid bruit, and supple, symmetrical, trachea midline Lungs: clear to auscultation bilaterally Heart: regular rate and rhythm Abdomen: soft, non-tender; bowel sounds normal; no masses,  no organomegaly and obese Extremities: edema 1+ bilateral pitting Pulses: 2+ and symmetric Skin: Skin color, texture, turgor normal. No rashes or lesions Neurologic: Grossly normal Psych: Pleasant  Inpatient Medications    Scheduled Meds:  apixaban  5 mg Oral BID   clopidogrel  75 mg Oral Daily   furosemide  40 mg Intravenous BID   insulin aspart  0-15 Units Subcutaneous TID WC   insulin aspart  6 Units Subcutaneous TID WC   insulin glargine  40 Units Subcutaneous QHS   lisinopril  40 mg Oral Daily   metoprolol  tartrate  25 mg Oral BID   pravastatin  80 mg Oral QHS   sodium chloride flush  3 mL Intravenous Q12H    Continuous Infusions:  sodium chloride      PRN Meds: sodium chloride, acetaminophen, meclizine, ondansetron (ZOFRAN) IV, sodium chloride flush   Labs   Results for orders placed or performed during the hospital encounter of 02/13/21 (from the past 48 hour(s))  Basic metabolic panel     Status: Abnormal   Collection Time: 02/13/21  4:27 PM  Result Value Ref Range   Sodium 139 135 - 145 mmol/L   Potassium 3.3 (L) 3.5 - 5.1 mmol/L   Chloride 102 98 - 111 mmol/L   CO2 27 22 - 32 mmol/L   Glucose, Bld 202 (H) 70 - 99 mg/dL    Comment: Glucose reference range applies only to samples taken after fasting for at least 8 hours.   BUN 16 8 - 23 mg/dL   Creatinine, Ser 4.09 (H) 0.61 - 1.24 mg/dL   Calcium 8.0 (L) 8.9 - 10.3 mg/dL   GFR, Estimated 37 (L) >60 mL/min    Comment: (NOTE) Calculated using the CKD-EPI Creatinine Equation (2021)    Anion gap 10 5 - 15    Comment: Performed at [facility], [address], [place], [place] [address]  CBC  with Differential     Status: Abnormal   Collection Time: 02/13/21  4:27 PM  Result Value Ref Range   WBC 10.6 (H) 4.0 - 10.5 K/uL   RBC 4.83 4.22 - 5.81 MIL/uL   Hemoglobin 13.6 13.0 - 17.0 g/dL   HCT 53.6 64.4 - 03.4 %   MCV 88.0 80.0 - 100.0 fL   MCH 28.2 26.0 - 34.0 pg   MCHC 32.0 30.0 - 36.0 g/dL   RDW 74.2 59.5 - 63.8 %   Platelets 179 150 - 400 K/uL   nRBC 0.0 0.0 - 0.2 %   Neutrophils Relative % 65 %   Neutro Abs 6.9 1.7 - 7.7 K/uL   Lymphocytes Relative 23 %   Lymphs Abs 2.5 0.7 - 4.0 K/uL   Monocytes Relative 8 %   Monocytes Absolute 0.8 0.1 - 1.0 K/uL   Eosinophils Relative 3 %   Eosinophils Absolute 0.3 0.0 - 0.5 K/uL   Basophils Relative 0 %   Basophils Absolute 0.0 0.0 - 0.1 K/uL   Immature Granulocytes 1 %   Abs Immature Granulocytes 0.06 0.00 - 0.07 K/uL    Comment: Performed at [facility],  [address] [place], [place] [address]  Brain natriuretic peptide     Status: Abnormal   Collection Time: 02/13/21  4:27 PM  Result Value Ref Range   B Natriuretic Peptide 204.4 (H) 0.0 - 100.0 pg/mL    Comment: Performed at [facility], [address], [place], [place] [address]  Troponin I (High Sensitivity)     Status: Abnormal   Collection Time: 02/13/21  4:27 PM  Result Value Ref Range   Troponin I (High Sensitivity) 45 (H) <18 ng/L    Comment: (NOTE) Elevated high sensitivity troponin I (hsTnI) values and significant  changes across serial measurements may suggest ACS but many other  chronic and acute conditions are known to elevate hsTnI results.  Refer to the "Links" section for chest pain algorithms and additional  guidance. Performed at [facility], [address] [place], [place] [address]   Resp Panel by RT-PCR (Flu A&B, Covid) Nasopharyngeal Swab     Status: None   Collection Time: 02/13/21  4:29 PM   Specimen: Nasopharyngeal Swab; Nasopharyngeal(NP) swabs in vial transport medium  Result Value Ref Range   SARS Coronavirus 2 by RT PCR NEGATIVE NEGATIVE    Comment: (NOTE) SARS-CoV-2 target nucleic acids are NOT DETECTED.  The SARS-CoV-2 RNA is generally detectable in upper respiratory specimens during the acute phase of infection. The lowest concentration of SARS-CoV-2 viral copies this assay can detect is 138 copies/mL. A negative result does not preclude SARS-Cov-2 infection and should not be used as the sole basis for treatment or other patient management decisions. A negative result may occur with  improper specimen collection/handling, submission of specimen other than nasopharyngeal swab, presence of viral mutation(s) within the areas targeted by this assay, and inadequate number of viral copies(<138 copies/mL). A negative result must be combined with clinical observations, patient history, and epidemiological information. The expected result is  Negative.  Fact Sheet for Patients:  BloggerCourse.com  Fact Sheet for Healthcare Providers:  SeriousBroker.it  This test is no t yet approved or cleared by the [place] FDA and  has been authorized for detection and/or diagnosis of SARS-CoV-2 by FDA under an Emergency Use Authorization (EUA). This EUA will remain  in effect (meaning this test can be used) for the duration of the  COVID-19 declaration under Section 564(b)(1) of the Act, 21 U.S.C.section 360bbb-3(b)(1), unless the authorization is terminated  or revoked sooner.       Influenza A by PCR NEGATIVE NEGATIVE   Influenza B by PCR NEGATIVE NEGATIVE    Comment: (NOTE) The Xpert Xpress SARS-CoV-2/FLU/RSV plus assay is intended as an aid in the diagnosis of influenza from Nasopharyngeal swab specimens and should not be used as a sole basis for treatment. Nasal washings and aspirates are unacceptable for Xpert Xpress SARS-CoV-2/FLU/RSV testing.  Fact Sheet for Patients: BloggerCourse.com  Fact Sheet for Healthcare Providers: SeriousBroker.it  This test is not yet approved or cleared by the [place] FDA and has been authorized for detection and/or diagnosis of SARS-CoV-2 by FDA under an Emergency Use Authorization (EUA). This EUA will remain in effect (meaning this test can be used) for the duration of the COVID-19 declaration under Section 564(b)(1) of the Act, 21 U.S.C. section 360bbb-3(b)(1), unless the authorization is terminated or revoked.  Performed at [facility], [address], [place], [place] [address]   Lactic acid, plasma     Status: Abnormal   Collection Time: 02/13/21  4:30 PM  Result Value Ref Range   Lactic Acid, Venous 2.9 (HH) 0.5 - 1.9 mmol/L    Comment: CRITICAL RESULT CALLED TO, READ BACK BY AND VERIFIED WITH: MEGAN RUGGIERO [email or web address]  ON 7.10.22 BY TCALDWELL MT. Performed at  [facility], [address] [place], [place] [address]   I-Stat arterial blood gas, ED     Status: Abnormal   Collection Time: 02/13/21  4:58 PM  Result Value Ref Range   pH, Arterial 7.356 7.350 - 7.450   pCO2 arterial 50.1 (H) 32.0 - 48.0 mmHg   pO2, Arterial 91 83.0 - 108.0 mmHg   Bicarbonate 28.1 (H) 20.0 - 28.0 mmol/L   TCO2 30 22 - 32 mmol/L   O2 Saturation 97.0 %   Acid-Base Excess 2.0 0.0 - 2.0 mmol/L   Sodium 140 135 - 145 mmol/L   Potassium 3.3 (L) 3.5 - 5.1 mmol/L   Calcium, Ion 1.15 1.15 - 1.40 mmol/L   HCT 38.0 (L) 39.0 - 52.0 %   Hemoglobin 12.9 (L) 13.0 - 17.0 g/dL   Patient temperature 29.4 F    Collection site Radial    Drawn by RT    Sample type ARTERIAL   Lactic acid, plasma     Status: None   Collection Time: 02/13/21  6:00 PM  Result Value Ref Range   Lactic Acid, Venous 1.5 0.5 - 1.9 mmol/L    Comment: Performed at [facility], [address] [place], [place] [address]  Troponin I (High Sensitivity)     Status: Abnormal   Collection Time: 02/13/21  6:00 PM  Result Value Ref Range   Troponin I (High Sensitivity) 53 (H) <18 ng/L    Comment: (NOTE) Elevated high sensitivity troponin I (hsTnI) values and significant  changes across serial measurements may suggest ACS but many other  chronic and acute conditions are known to elevate hsTnI results.  Refer to the "Links" section for chest pain algorithms and additional  guidance. Performed at [facility], [address], [place], [place] [address]   Basic metabolic panel     Status: Abnormal   Collection Time: 02/14/21  5:00 AM  Result Value Ref Range   Sodium 137 135 - 145 mmol/L   Potassium 3.9 3.5 - 5.1 mmol/L   Chloride 100 98 -  111 mmol/L   CO2 28 22 - 32 mmol/L   Glucose, Bld 92 70 - 99 mg/dL    Comment: Glucose reference range applies only to samples taken after fasting for at least 8 hours.   BUN 16 8 - 23 mg/dL   Creatinine, Ser 2.441.83 (H) 0.61 - 1.24 mg/dL   Calcium 8.2  (L) 8.9 - 10.3 mg/dL   GFR, Estimated 38 (L) >60 mL/min    Comment: (NOTE) Calculated using the CKD-EPI Creatinine Equation (2021)    Anion gap 9 5 - 15    Comment: Performed at [facility], [address] [place], [place] [id number]  CBG monitoring, ED     Status: None   Collection Time: 02/14/21  7:56 AM  Result Value Ref Range   Glucose-Capillary 89 70 - 99 mg/dL    Comment: Glucose reference range applies only to samples taken after fasting for at least 8 hours.    ECG   N/A  Telemetry   Sinus rhythm- Personally Reviewed  Radiology    CT Head Wo Contrast  Result Date: 02/13/2021 CLINICAL DATA:  Altered mental status EXAM: CT HEAD WITHOUT CONTRAST TECHNIQUE: Contiguous axial images were obtained from the base of the skull through the vertex without intravenous contrast. COMPARISON:  06/18/2019 FINDINGS: Brain: No evidence of acute infarction, hemorrhage, hydrocephalus, extra-axial collection or mass lesion/mass effect. Mild atrophic and chronic white matter ischemic changes are noted. 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 Vascular: No hyperdense vessel or unexpected calcification. Skull: Normal. Negative for fracture or focal lesion. Sinuses/Orbits: No acute finding. Other: None. IMPRESSION: Chronic atrophic and ischemic changes without acute abnormality. Electronically Signed   By: Alcide CleverMark  Lukens M.D.   On: 02/13/2021 20:31   CT Angio Chest PE W/Cm &/Or Wo Cm  Result Date: 02/13/2021 CLINICAL DATA:  Shortness of breath EXAM: CT ANGIOGRAPHY CHEST WITH CONTRAST TECHNIQUE: Multidetector CT imaging of the chest was performed using the standard protocol during bolus administration of intravenous contrast. Multiplanar CT image reconstructions and MIPs were obtained to evaluate the vascular anatomy. CONTRAST:  50mL OMNIPAQUE IOHEXOL 350 MG/ML SOLN COMPARISON:  Chest radiograph Dec 24, 2017 FINDINGS: Cardiovascular: Satisfactory opacification  of the pulmonary arteries to the segmental level. No evidence of pulmonary embolism. Gas in the pulmonary outflow tract related to vascular access. Aortic atherosclerosis without aneurysmal dilation. Mild cardiac enlargement. No pericardial significant effusion/thickening. No suspicious intracardiac filling defect. Mediastinum/Nodes: No discrete thyroid nodule. No pathologically enlarged mediastinal, hilar or axillary lymph nodes. The trachea esophagus are grossly unremarkable. Lungs/Pleura: Layering small bilateral pleural effusions with adjacent airspace consolidations/atelectasis. Mild diffuse interstitial thickening with adjacent ground-glass opacities. Upper Abdomen: Peripherally calcified splenic hypodensity measuring 2.7 cm, incompletely evaluated but favored sequela prior trauma. Thickening of adrenal glands without discrete nodularity, favor hyperplasia. Hypodense 5 mm focus in the upper pole the right kidney which may represent a proteinaceous/hemorrhagic cyst but is incompletely evaluated on this study. Musculoskeletal: Multilevel degenerative changes spine. Degenerative changes of the bilateral shoulders. No acute osseous. Review of the MIP images confirms the above findings. IMPRESSION: 1. No evidence of pulmonary embolism. 2. Findings suggestive of congestive heart failure with cardiomegaly, pulmonary edema and small layering bilateral pleural effusions with adjacent atelectasis/consolidations. 3. Peripherally calcified splenic hypodensity measuring 2.7 cm, incompletely evaluated but favored sequela prior trauma. 4. Hyperdense 5 mm focus in the upper pole the right kidney which may represent a proteinaceous/hemorrhagic cyst but is incompletely evaluated on this single-phase study. Consider  further evaluation with renal protocol MRI CT with and without contrast in 6 months for further characterization assessment of stability. 5. Aortic Atherosclerosis (ICD10-I70.0). Electronically Signed   By: Maudry Mayhew MD   On: 02/13/2021 20:38   DG Chest Port 1 View  Result Date: 02/13/2021 CLINICAL DATA:  Shortness of breath EXAM: PORTABLE CHEST 1 VIEW COMPARISON:  Dec 24, 2017 FINDINGS: Patient is rotated. The cardiac shadow appears slightly enlarged, at least somewhat accentuated by technique. Central vascular prominence. Mild bilateral interstitial thickening. Slightly reduced lung volumes with hypoventilatory change. The visualized skeletal structures are unremarkable. IMPRESSION: 1. Central vascular prominence and mild bilateral interstitial thickening, possibly representing mild interstitial edema. 2. Slightly reduced lung volumes with hypoventilatory change. Electronically Signed   By: Maudry Mayhew MD   On: 02/13/2021 17:04    Cardiac Studies   Echo pending  Assessment   Principal Problem:   Acute pulmonary edema (HCC) Active Problems:   Diabetes mellitus type 2 in nonobese (HCC)   Essential hypertension   HLD (hyperlipidemia)   Narrow complex tachycardia (HCC)   CKD (chronic kidney disease) stage 3, GFR 30-59 ml/min (HCC)   New onset of congestive heart failure (HCC)   Diastolic CHF, acute (HCC)   A-fib (HCC)   Atrial flutter (HCC)   Hypoxia   OSA (obstructive sleep apnea)   Plan   Caleb Cuevas appears better than as described yesterday.  He has had no further atrial arrhythmias but was noted to have both A. fib and flutter and had a cardioversion yesterday.  He remains hypertensive.  There is still some signs and symptoms of diastolic heart failure.  A repeat echo is pending.  We may need to add additional medication to help with his blood pressure such as amlodipine.  He has been started on Eliquis.  Cardiology will follow with you  Time Spent Directly with Patient:  I have spent a total of 25 minutes with the patient reviewing hospital notes, telemetry, EKGs, labs and examining the patient as well as establishing an assessment and plan that was discussed personally with the  patient.  > 50% of time was spent in direct patient care.  Length of Stay:  LOS: 0 days   Chrystie Nose, MD, [facility], FACP  [place]  [facility]  Medical Director of the [facility] Lipid [facility] &  Cardiovascular Risk Reduction Clinic Diplomate of the American Board of Clinical Lipidology Attending Cardiologist  Direct Dial: [phone]  Fax: [phone]  Website:  [email or web address] 02/14/2021, 8:33 AM

## 2021-02-14 NOTE — ED Notes (Signed)
Paged transport for a hospital bed

## 2021-02-14 NOTE — Discharge Planning (Signed)
Pt active at [facility]) MD: Siy-Hian Discharge: Nila Nephew    Desk phone: [phone]

## 2021-02-14 NOTE — Progress Notes (Addendum)
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  Also reported bilateral lower extremity edema .  He experienced severe shortness of breath so he called EMS and he was brought to the emergency department.  On presentation his heart rate was in the range of 200s and EKG showed narrow complex tachycardia.  He was cardioverted by EMS and also given Cardizem 10 mg and then he converted to normal sinus rhythm.  CTA chest showed pulmonary edema.  Patient was admitted for the management of acute congestive heart failure.  Cardiology also following.  Started on IV Lasix.  Assessment & Plan:   Principal Problem:   Acute pulmonary edema (HCC) Active Problems:   Diabetes mellitus type 2 in nonobese (HCC)   Essential hypertension   HLD (hyperlipidemia)   Narrow complex tachycardia (HCC)   CKD (chronic kidney disease) stage 3, GFR 30-59 ml/min (HCC)   New onset of congestive heart failure (HCC)   Diastolic CHF, acute (HCC)   A-fib (HCC)   Atrial flutter (HCC)   Hypoxia   OSA (obstructive sleep apnea)   Acute respiratory failure with hypoxia (HCC)  Acute congestive heart failure: No history of heart failure in the past.  Reported orthopnea, dyspnea on exertion, bilateral lower extremity edema.  Monitor input/output, daily weight.  Check BNP.  Echocardiogram pending.  Elevated BNP on presentation  Acute hypoxic respiratory failure: He was apneic on arrival and required bag valve mask.  Currently requiring 3 to 4 L of oxygen per minute.  Does not use  oxygen at home.  Most likely secondary to acute congestive heart failure and also possible COPD.  Chest imaging showed pulmonary edema.  Continue diuresis.  We will try to continue to taper the oxygen.  COPD exacerbation: No documented history of COPD but he was wheezing on auscultation.  And he is a past smoker.  Started on prednisone, bronchodilators  Narrow complex tachycardia: Heart rate was in the range of 200s on presentation.  He was cardioverted by EMS and was also given a dose of Cardizem 10 mg IV then he converted to normal sinus rhythm.  Initial strip showed A. fib/atrial flutter.  EKG monitor showed normal sinus rhythm with normal heart rate during my evaluation in the emergency department.  Monitor electrolytes.  Continue metoprolol 25 mg daily.  He also has been started on Eliquis  Hypertension: Currently blood pressure mildly elevated.  Continue lisinopril diuretics at home.  Also continue metoprolol  Diabetes type 2: Takes insulin at home.  Monitor blood sugars.  Continue current insulin regimen  Hyperlipidemia: Continue statin  CKD stage IIIb: Monitor kidney function,currently  at baseline.  Baseline creatinine ranges from 1.5-1.8  H/O PAD/TIA:History of minimal nonocclusive coronary artery disease on left heart catheterization in 2019.  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  We will check with cardiology if we can discontinue Plavix because he has been started on Eliquis  Debility/deconditioning: We will request for PT/OT evaluation when appropriate  DVT prophylaxis:Eliquis Code Status: Full Family Communication: wife at bed side Status is: Inpatient  Remains inpatient appropriate because:Unsafe d/c plan  Dispo: The patient is from: Home              Anticipated d/c is to: Home              Patient currently is not medically stable to d/c.   Difficult to place  patient No     Consultants: Cardiology  Procedures:None  Antimicrobials:  Anti-infectives (From admission, onward)    None       Subjective: Patient seen and examined at the bedside this morning.  Hemodynamically stable during my evaluation.  Mildly hypertensive.  He said he feels much better .  On 3 to 4 L of oxygen per minute.  Denies any worsening cough or shortness of breath.  Heart rate is well controlled and he was in normal sinus rhythm.  Objective: Vitals:   02/14/21 0340 02/14/21 0606 02/14/21 0845 02/14/21 1200  BP: (!) 151/76 (!) 163/79 (!) 157/69 (!) 153/72  Pulse: 68 80 72 72  Resp: 18  20 17   Temp: 97.9 F (36.6 C)     TempSrc: Temporal     SpO2: 100% 98% 99% 100%  Weight:      Height:       No intake or output data in the 24 hours ending 02/14/21 1339 Filed Weights   02/13/21 1630  Weight: 99.8 kg    Examination:  General exam: Overall comfortable, not in distress,obese HEENT: PERRL Respiratory system:  no wheezes or crackles  Cardiovascular system: S1 & S2 heard, RRR.  Gastrointestinal system: Abdomen is nondistended, soft and nontender. Central nervous system: Alert and oriented Extremities: No edema, no clubbing ,no cyanosis Skin: No rashes, no ulcers,no icterus       Data Reviewed: I have personally reviewed following labs and imaging studies  CBC: Recent Labs  Lab 02/13/21 1627 02/13/21 1658  WBC 10.6*  --   NEUTROABS 6.9  --   HGB 13.6 12.9*  HCT 42.5 38.0*  MCV 88.0  --   PLT 179  --    Basic Metabolic Panel: Recent Labs  Lab 02/13/21 1627 02/13/21 1658 02/14/21 0500  NA 139 140 137  K 3.3* 3.3* 3.9  CL 102  --  100  CO2 27  --  28  GLUCOSE 202*  --  92  BUN 16  --  16  CREATININE 1.87*  --  1.83*  CALCIUM 8.0*  --  8.2*   GFR: Estimated Creatinine Clearance: 42.6 mL/min (A) (by C-G formula based on SCr of 1.83 mg/dL (H)). Liver Function Tests: No results for input(s): AST, ALT, ALKPHOS, BILITOT, PROT, ALBUMIN  in the last 168 hours. No results for input(s): LIPASE, AMYLASE in the last 168 hours. No results for input(s): AMMONIA in the last 168 hours. Coagulation Profile: No results for input(s): INR, PROTIME in the last 168 hours. Cardiac Enzymes: No results for input(s): CKTOTAL, CKMB, CKMBINDEX, TROPONINI in the last 168 hours. BNP (last 3 results) No results for input(s): PROBNP in the last 8760 hours. HbA1C: No results for input(s): HGBA1C in the last 72 hours. CBG: Recent Labs  Lab 02/14/21 0756 02/14/21 1154 02/14/21 1220  GLUCAP 89 149* 133*   Lipid Profile: No results for input(s): CHOL, HDL, LDLCALC, TRIG, CHOLHDL, LDLDIRECT in the last 72 hours. Thyroid Function Tests: No results for input(s): TSH, T4TOTAL, FREET4, T3FREE, THYROIDAB in the last 72 hours. Anemia Panel:  No results for input(s): VITAMINB12, FOLATE, FERRITIN, TIBC, IRON, RETICCTPCT in the last 72 hours. Sepsis Labs: Recent Labs  Lab 02/13/21 1630 02/13/21 1800  LATICACIDVEN 2.9* 1.5    Recent Results (from the past 240 hour(s))  Resp Panel by RT-PCR (Flu A&B, Covid) Nasopharyngeal Swab     Status: None   Collection Time: 02/13/21  4:29 PM   Specimen: Nasopharyngeal Swab; Nasopharyngeal(NP) swabs in vial transport medium  Result Value Ref Range Status   SARS Coronavirus 2 by RT PCR NEGATIVE NEGATIVE Final    Comment: (NOTE) SARS-CoV-2 target nucleic acids are NOT DETECTED.  The SARS-CoV-2 RNA is generally detectable in upper respiratory specimens during the acute phase of infection. The lowest concentration of SARS-CoV-2 viral copies this assay can detect is 138 copies/mL. A negative result does not preclude SARS-Cov-2 infection and should not be used as the sole basis for treatment or other patient management decisions. A negative result may occur with  improper specimen collection/handling, submission of specimen other than nasopharyngeal swab, presence of viral mutation(s) within the areas targeted  by this assay, and inadequate number of viral copies(<138 copies/mL). A negative result must be combined with clinical observations, patient history, and epidemiological information. The expected result is Negative.  Fact Sheet for Patients:  BloggerCourse.com  Fact Sheet for Healthcare Providers:  SeriousBroker.it  This test is no t yet approved or cleared by the [place] FDA and  has been authorized for detection and/or diagnosis of SARS-CoV-2 by FDA under an Emergency Use Authorization (EUA). This EUA will remain  in effect (meaning this test can be used) for the duration of the COVID-19 declaration under Section 564(b)(1) of the Act, 21 U.S.C.section 360bbb-3(b)(1), unless the authorization is terminated  or revoked sooner.       Influenza A by PCR NEGATIVE NEGATIVE Final   Influenza B by PCR NEGATIVE NEGATIVE Final    Comment: (NOTE) The Xpert Xpress SARS-CoV-2/FLU/RSV plus assay is intended as an aid in the diagnosis of influenza from Nasopharyngeal swab specimens and should not be used as a sole basis for treatment. Nasal washings and aspirates are unacceptable for Xpert Xpress SARS-CoV-2/FLU/RSV testing.  Fact Sheet for Patients: BloggerCourse.com  Fact Sheet for Healthcare Providers: SeriousBroker.it  This test is not yet approved or cleared by the [place] FDA and has been authorized for detection and/or diagnosis of SARS-CoV-2 by FDA under an Emergency Use Authorization (EUA). This EUA will remain in effect (meaning this test can be used) for the duration of the COVID-19 declaration under Section 564(b)(1) of the Act, 21 U.S.C. section 360bbb-3(b)(1), unless the authorization is terminated or revoked.  Performed at [facility], [address] [place], [place] [address]          Radiology Studies: CT Head Wo Contrast  Result Date:  02/13/2021 CLINICAL DATA:  Altered mental status EXAM: CT HEAD WITHOUT CONTRAST TECHNIQUE: Contiguous axial images were obtained from the base of the skull through the vertex without intravenous contrast. COMPARISON:  06/18/2019 FINDINGS: Brain: No evidence of acute infarction, hemorrhage, hydrocephalus, extra-axial collection or mass lesion/mass effect. Mild atrophic and chronic white matter ischemic changes are noted. 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 Vascular: No hyperdense vessel or unexpected calcification. Skull: Normal. Negative for fracture or focal lesion. Sinuses/Orbits: No acute finding. Other: None. IMPRESSION: Chronic atrophic and ischemic changes without acute abnormality. Electronically Signed   By: Alcide Clever M.D.   On: 02/13/2021 20:31   CT  Angio Chest PE W/Cm &/Or Wo Cm  Result Date: 02/13/2021 CLINICAL DATA:  Shortness of breath EXAM: CT ANGIOGRAPHY CHEST WITH CONTRAST TECHNIQUE: Multidetector CT imaging of the chest was performed using the standard protocol during bolus administration of intravenous contrast. Multiplanar CT image reconstructions and MIPs were obtained to evaluate the vascular anatomy. CONTRAST:  67mL OMNIPAQUE IOHEXOL 350 MG/ML SOLN COMPARISON:  Chest radiograph Dec 24, 2017 FINDINGS: Cardiovascular: Satisfactory opacification of the pulmonary arteries to the segmental level. No evidence of pulmonary embolism. Gas in the pulmonary outflow tract related to vascular access. Aortic atherosclerosis without aneurysmal dilation. Mild cardiac enlargement. No pericardial significant effusion/thickening. No suspicious intracardiac filling defect. Mediastinum/Nodes: No discrete thyroid nodule. No pathologically enlarged mediastinal, hilar or axillary lymph nodes. The trachea esophagus are grossly unremarkable. Lungs/Pleura: Layering small bilateral pleural effusions with adjacent airspace consolidations/atelectasis. Mild diffuse  interstitial thickening with adjacent ground-glass opacities. Upper Abdomen: Peripherally calcified splenic hypodensity measuring 2.7 cm, incompletely evaluated but favored sequela prior trauma. Thickening of adrenal glands without discrete nodularity, favor hyperplasia. Hypodense 5 mm focus in the upper pole the right kidney which may represent a proteinaceous/hemorrhagic cyst but is incompletely evaluated on this study. Musculoskeletal: Multilevel degenerative changes spine. Degenerative changes of the bilateral shoulders. No acute osseous. Review of the MIP images confirms the above findings. IMPRESSION: 1. No evidence of pulmonary embolism. 2. Findings suggestive of congestive heart failure with cardiomegaly, pulmonary edema and small layering bilateral pleural effusions with adjacent atelectasis/consolidations. 3. Peripherally calcified splenic hypodensity measuring 2.7 cm, incompletely evaluated but favored sequela prior trauma. 4. Hyperdense 5 mm focus in the upper pole the right kidney which may represent a proteinaceous/hemorrhagic cyst but is incompletely evaluated on this single-phase study. Consider further evaluation with renal protocol MRI CT with and without contrast in 6 months for further characterization assessment of stability. 5. Aortic Atherosclerosis (ICD10-I70.0). Electronically Signed   By: Maudry Mayhew MD   On: 02/13/2021 20:38   DG Chest Port 1 View  Result Date: 02/13/2021 CLINICAL DATA:  Shortness of breath EXAM: PORTABLE CHEST 1 VIEW COMPARISON:  Dec 24, 2017 FINDINGS: Patient is rotated. The cardiac shadow appears slightly enlarged, at least somewhat accentuated by technique. Central vascular prominence. Mild bilateral interstitial thickening. Slightly reduced lung volumes with hypoventilatory change. The visualized skeletal structures are unremarkable. IMPRESSION: 1. Central vascular prominence and mild bilateral interstitial thickening, possibly representing mild interstitial  edema. 2. Slightly reduced lung volumes with hypoventilatory change. Electronically Signed   By: Maudry Mayhew MD   On: 02/13/2021 17:04        Scheduled Meds:  apixaban  5 mg Oral BID   clopidogrel  75 mg Oral Daily   furosemide  40 mg Intravenous BID   insulin aspart  0-15 Units Subcutaneous TID WC   insulin aspart  6 Units Subcutaneous TID WC   insulin glargine  40 Units Subcutaneous QHS   ipratropium-albuterol  3 mL Nebulization Q6H   lisinopril  40 mg Oral Daily   metoprolol tartrate  25 mg Oral BID   pravastatin  80 mg Oral QHS   predniSONE  40 mg Oral Q breakfast   sodium chloride flush  3 mL Intravenous Q12H   Continuous Infusions:  sodium chloride       LOS: 0 days    Time spent: 35 mins.More than 50% of that time was spent in counseling and/or coordination of care.      Burnadette Pop, MD [facility] P7/06/2021, 1:39 PM

## 2021-02-15 DIAGNOSIS — I5021 Acute systolic (congestive) heart failure: Secondary | ICD-10-CM

## 2021-02-15 DIAGNOSIS — G4733 Obstructive sleep apnea (adult) (pediatric): Secondary | ICD-10-CM

## 2021-02-15 DIAGNOSIS — J9601 Acute respiratory failure with hypoxia: Secondary | ICD-10-CM

## 2021-02-15 LAB — BASIC METABOLIC PANEL
Anion gap: 7 (ref 5–15)
BUN: 26 mg/dL — ABNORMAL HIGH (ref 8–23)
CO2: 32 mmol/L (ref 22–32)
Calcium: 8.6 mg/dL — ABNORMAL LOW (ref 8.9–10.3)
Chloride: 100 mmol/L (ref 98–111)
Creatinine, Ser: 2.32 mg/dL — ABNORMAL HIGH (ref 0.61–1.24)
GFR, Estimated: 29 mL/min — ABNORMAL LOW (ref >60)
Glucose, Bld: 197 mg/dL — ABNORMAL HIGH (ref 70–99)
Potassium: 4.7 mmol/L (ref 3.5–5.1)
Sodium: 139 mmol/L (ref 135–145)

## 2021-02-15 LAB — TSH: TSH: 1.933 u[IU]/mL (ref 0.350–4.500)

## 2021-02-15 LAB — CBC WITH DIFFERENTIAL/PLATELET
Abs Immature Granulocytes: 0.03 10*3/uL (ref 0.00–0.07)
Basophils Absolute: 0 10*3/uL (ref 0.0–0.1)
Basophils Relative: 0 %
Eosinophils Absolute: 0 10*3/uL (ref 0.0–0.5)
Eosinophils Relative: 0 %
HCT: 40.7 % (ref 39.0–52.0)
Hemoglobin: 13 g/dL (ref 13.0–17.0)
Immature Granulocytes: 0 %
Lymphocytes Relative: 14 %
Lymphs Abs: 1.5 10*3/uL (ref 0.7–4.0)
MCH: 27.8 pg (ref 26.0–34.0)
MCHC: 31.9 g/dL (ref 30.0–36.0)
MCV: 87 fL (ref 80.0–100.0)
Monocytes Absolute: 1.1 10*3/uL — ABNORMAL HIGH (ref 0.1–1.0)
Monocytes Relative: 10 %
Neutro Abs: 8.4 10*3/uL — ABNORMAL HIGH (ref 1.7–7.7)
Neutrophils Relative %: 76 %
Platelets: 169 10*3/uL (ref 150–400)
RBC: 4.68 MIL/uL (ref 4.22–5.81)
RDW: 14.1 % (ref 11.5–15.5)
WBC: 11.1 10*3/uL — ABNORMAL HIGH (ref 4.0–10.5)
nRBC: 0 % (ref 0.0–0.2)

## 2021-02-15 LAB — HEMOGLOBIN A1C
Hgb A1c MFr Bld: 6.8 % — ABNORMAL HIGH (ref 4.8–5.6)
Mean Plasma Glucose: 148.46 mg/dL

## 2021-02-15 LAB — GLUCOSE, CAPILLARY
Glucose-Capillary: 111 mg/dL — ABNORMAL HIGH (ref 70–99)
Glucose-Capillary: 125 mg/dL — ABNORMAL HIGH (ref 70–99)
Glucose-Capillary: 288 mg/dL — ABNORMAL HIGH (ref 70–99)
Glucose-Capillary: 271 mg/dL — ABNORMAL HIGH (ref 70–99)

## 2021-02-15 MED ORDER — LABETALOL HCL 5 MG/ML IV SOLN
10.0000 mg | Freq: Once | INTRAVENOUS | Status: DC
  Filled 2021-02-15 (×2): qty 4

## 2021-02-15 NOTE — Progress Notes (Signed)
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  Also reported bilateral lower extremity edema .  He experienced severe shortness of breath so he called EMS and he was brought to the emergency department.  On presentation his heart rate was in the range of 200s and EKG showed narrow complex tachycardia.  He was cardioverted by EMS and also given Cardizem 10 mg and then he converted to normal sinus rhythm.  CTA chest showed pulmonary edema.  Patient was admitted for the management of acute congestive heart failure.  Cardiology also following.    Assessment & Plan:   Principal Problem:   Acute pulmonary edema (HCC) Active Problems:   Diabetes mellitus type 2 in nonobese (HCC)   Essential hypertension   HLD (hyperlipidemia)   Narrow complex tachycardia (HCC)   CKD (chronic kidney disease) stage 3, GFR 30-59 ml/min (HCC)   New onset of congestive heart failure (HCC)   Diastolic CHF, acute (HCC)   A-fib (HCC)   Atrial flutter (HCC)   Hypoxia   OSA (obstructive sleep apnea)   Acute respiratory failure with hypoxia (HCC)  Acute systolic/diastolic combined congestive heart failure: No history of heart failure in the past.  Reported orthopnea, dyspnea on exertion, bilateral lower extremity edema  Monitor input/output, daily weight.  Elevated BNP on presentation.  Echo showed EF of 45 to 50%, grade 3 diastolic dysfunction. Lasix held because of worsening kidney function.  Currently he looks euvolemic/slightly on drier side  Acute hypoxic respiratory  failure: He was apneic on arrival and required bag valve mask.  Currently requiring 3 to 4 L of oxygen per minute.  Does not use oxygen at home.  Most likely secondary to acute congestive heart failure and also possible COPD exacerbation.  Chest imaging showed pulmonary edema.   We will try to continue to taper the oxygen.  COPD exacerbation: No documented history of COPD but he was wheezing on auscultation.  And he is a past smoker.  Started on prednisone, bronchodilators  Narrow complex tachycardia: Heart rate was in the range of 200s on presentation.  He was cardioverted by EMS and was also given a dose of Cardizem 10 mg IV then he converted to normal sinus rhythm.  Initial strip showed A. fib/atrial flutter.    Monitor electrolytes.  Continue metoprolol 25 mg twice daily.  He also has been started on Eliquis  Hypertension: Currently blood pressure stable.  On lisinopril, metoprolol.  Will hold on lisinopril if creatinine worsens  Diabetes type 2: Takes insulin at home.  Monitor blood sugars.  Continue current insulin regimen  Hyperlipidemia: Continue statin  AKI on CKD stage IIIb:   Baseline creatinine ranges from 1.5-1.8.  Creatinine bumped up to more than 2.  Lasix on hold.  Check urine creatinine and sodium  H/O PAD/TIA:History of minimal nonocclusive coronary artery disease on left heart catheterization in 2019.  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  We will check with cardiology if we can discontinue Plavix because  he has been started on Eliquis  Debility/deconditioning: We have requested PT/OT evaluation          DVT prophylaxis:Eliquis Code Status: Full Family Communication: wife at bed side on 02/14/21, called again today-call not received Status is: Inpatient  Remains inpatient appropriate because:Unsafe d/c plan  Dispo: The patient is from: Home              Anticipated d/c is  to: Home              Patient currently is not medically stable to d/c.   Difficult to place patient No     Consultants: Cardiology  Procedures:None  Antimicrobials:  Anti-infectives (From admission, onward)    None       Subjective: Patient seen and examined the bedside this morning.  Hemodynamically stable comfortable.  Still on 3 to 4 L of oxygen per minute.  Denies any worsening shortness of breath or cough.  Does not have any peripheral edema.  Tongue is dry.  Objective: Vitals:   02/15/21 0341 02/15/21 0402 02/15/21 0746 02/15/21 0803  BP: (!) 167/67   135/60  Pulse:   73 80  Resp:   18 18  Temp: 98.1 F (36.7 C)     TempSrc: Oral     SpO2:   99% 100%  Weight:  99.7 kg    Height:        Intake/Output Summary (Last 24 hours) at 02/15/2021 0807 Last data filed at 02/15/2021 0300 Gross per 24 hour  Intake --  Output 400 ml  Net -400 ml   Filed Weights   02/13/21 1630 02/15/21 0402  Weight: 99.8 kg 99.7 kg    Examination:  General exam: Overall comfortable, not in distress,obese,dry mouth HEENT: PERRL Respiratory system: Diminished air entry bilaterally, no wheezes or crackles  Cardiovascular system: S1 & S2 heard, RRR.  Gastrointestinal system: Abdomen is nondistended, soft and nontender. Central nervous system: Alert and oriented Extremities: No edema, no clubbing ,no cyanosis Skin: No rashes, no ulcers,no icterus     Data Reviewed: I have personally reviewed following labs and imaging studies  CBC: Recent Labs  Lab 02/13/21 1627 02/13/21 1658 02/15/21 0336  WBC 10.6*  --  11.1*  NEUTROABS 6.9  --  8.4*  HGB 13.6 12.9* 13.0  HCT 42.5 38.0* 40.7  MCV 88.0  --  87.0  PLT 179  --  169   Basic Metabolic Panel: Recent Labs  Lab 02/13/21 1627 02/13/21 1658 02/14/21 0500 02/15/21 0336  NA 139 140 137 139  K 3.3* 3.3* 3.9 4.7  CL 102  --  100 100  CO2 27  --  28 32  GLUCOSE 202*  --  92 197*  BUN 16  --  16 26*  CREATININE 1.87*  --   1.83* 2.32*  CALCIUM 8.0*  --  8.2* 8.6*   GFR: Estimated Creatinine Clearance: 33.6 mL/min (A) (by C-G formula based on SCr of 2.32 mg/dL (H)). Liver Function Tests: No results for input(s): AST, ALT, ALKPHOS, BILITOT, PROT, ALBUMIN in the last 168 hours. No results for input(s): LIPASE, AMYLASE in the last 168 hours. No results for input(s): AMMONIA in the last 168 hours. Coagulation Profile: No results for input(s): INR, PROTIME in the last 168 hours. Cardiac Enzymes: No results for input(s): CKTOTAL, CKMB, CKMBINDEX, TROPONINI in the last 168 hours. BNP (last 3 results) No results for input(s): PROBNP in the last 8760 hours. HbA1C: Recent Labs    02/15/21 0336  HGBA1C 6.8*   CBG: Recent Labs  Lab 02/14/21 0756 02/14/21 1154 02/14/21 1220 02/14/21 1626 02/15/21 0758  GLUCAP 89 149* 133* 111* 111*   Lipid Profile: No results for input(s): CHOL, HDL, LDLCALC, TRIG, CHOLHDL, LDLDIRECT in the last 72 hours. Thyroid Function Tests: Recent Labs    02/15/21 0336  TSH 1.933   Anemia Panel: No results for input(s): VITAMINB12, FOLATE, FERRITIN, TIBC, IRON, RETICCTPCT in the last 72 hours. Sepsis Labs: Recent Labs  Lab 02/13/21 1630 02/13/21 1800  LATICACIDVEN 2.9* 1.5    Recent Results (from the past 240 hour(s))  Resp Panel by RT-PCR (Flu A&B, Covid) Nasopharyngeal Swab     Status: None   Collection Time: 02/13/21  4:29 PM   Specimen: Nasopharyngeal Swab; Nasopharyngeal(NP) swabs in vial transport medium  Result Value Ref Range Status   SARS Coronavirus 2 by RT PCR NEGATIVE NEGATIVE Final    Comment: (NOTE) SARS-CoV-2 target nucleic acids are NOT DETECTED.  The SARS-CoV-2 RNA is generally detectable in upper respiratory specimens during the acute phase of infection. The lowest concentration of SARS-CoV-2 viral copies this assay can detect is 138 copies/mL. A negative result does not preclude SARS-Cov-2 infection and should not be used as the sole basis for  treatment or other patient management decisions. A negative result may occur with  improper specimen collection/handling, submission of specimen other than nasopharyngeal swab, presence of viral mutation(s) within the areas targeted by this assay, and inadequate number of viral copies(<138 copies/mL). A negative result must be combined with clinical observations, patient history, and epidemiological information. The expected result is Negative.  Fact Sheet for Patients:  BloggerCourse.com  Fact Sheet for Healthcare Providers:  SeriousBroker.it  This test is no t yet approved or cleared by the [place] FDA and  has been authorized for detection and/or diagnosis of SARS-CoV-2 by FDA under an Emergency Use Authorization (EUA). This EUA will remain  in effect (meaning this test can be used) for the duration of the COVID-19 declaration under Section 564(b)(1) of the Act, 21 U.S.C.section 360bbb-3(b)(1), unless the authorization is terminated  or revoked sooner.       Influenza A by PCR NEGATIVE NEGATIVE Final   Influenza B by PCR NEGATIVE NEGATIVE Final    Comment: (NOTE) The Xpert Xpress SARS-CoV-2/FLU/RSV plus assay is intended as an aid in the diagnosis of influenza from Nasopharyngeal swab specimens and should not be used as a sole basis for treatment. Nasal washings and aspirates are unacceptable for Xpert Xpress SARS-CoV-2/FLU/RSV testing.  Fact Sheet for Patients: BloggerCourse.com  Fact Sheet for Healthcare Providers: SeriousBroker.it  This test is not yet approved or cleared by the [place] FDA and has been authorized for detection and/or diagnosis of SARS-CoV-2 by FDA under an Emergency Use Authorization (EUA). This EUA will remain in effect (meaning this test can be used) for the duration of the COVID-19 declaration under Section 564(b)(1) of the Act, 21  U.S.C. section 360bbb-3(b)(1), unless the authorization is terminated or revoked.  Performed at [facility], [address], [place], [place] [address]          Radiology Studies: CT Head Wo Contrast  Result Date: 02/13/2021 CLINICAL DATA:  Altered mental status EXAM: CT HEAD WITHOUT CONTRAST TECHNIQUE: Contiguous axial images were obtained from the base of the skull through the vertex without intravenous contrast. COMPARISON:  06/18/2019 FINDINGS: Brain: No evidence of acute infarction, hemorrhage, hydrocephalus, extra-axial collection or mass lesion/mass effect. Mild atrophic and chronic white matter ischemic  changes are noted. 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 Vascular: No hyperdense vessel or unexpected calcification. Skull: Normal. Negative for fracture or focal lesion. Sinuses/Orbits: No acute finding. Other: None. IMPRESSION: Chronic atrophic and ischemic changes without acute abnormality. Electronically Signed   By: Alcide Clever M.D.   On: 02/13/2021 20:31   CT Angio Chest PE W/Cm &/Or Wo Cm  Result Date: 02/13/2021 CLINICAL DATA:  Shortness of breath EXAM: CT ANGIOGRAPHY CHEST WITH CONTRAST TECHNIQUE: Multidetector CT imaging of the chest was performed using the standard protocol during bolus administration of intravenous contrast. Multiplanar CT image reconstructions and MIPs were obtained to evaluate the vascular anatomy. CONTRAST:  19mL OMNIPAQUE IOHEXOL 350 MG/ML SOLN COMPARISON:  Chest radiograph Dec 24, 2017 FINDINGS: Cardiovascular: Satisfactory opacification of the pulmonary arteries to the segmental level. No evidence of pulmonary embolism. Gas in the pulmonary outflow tract related to vascular access. Aortic atherosclerosis without aneurysmal dilation. Mild cardiac enlargement. No pericardial significant effusion/thickening. No suspicious intracardiac filling defect. Mediastinum/Nodes: No discrete thyroid nodule. No  pathologically enlarged mediastinal, hilar or axillary lymph nodes. The trachea esophagus are grossly unremarkable. Lungs/Pleura: Layering small bilateral pleural effusions with adjacent airspace consolidations/atelectasis. Mild diffuse interstitial thickening with adjacent ground-glass opacities. Upper Abdomen: Peripherally calcified splenic hypodensity measuring 2.7 cm, incompletely evaluated but favored sequela prior trauma. Thickening of adrenal glands without discrete nodularity, favor hyperplasia. Hypodense 5 mm focus in the upper pole the right kidney which may represent a proteinaceous/hemorrhagic cyst but is incompletely evaluated on this study. Musculoskeletal: Multilevel degenerative changes spine. Degenerative changes of the bilateral shoulders. No acute osseous. Review of the MIP images confirms the above findings. IMPRESSION: 1. No evidence of pulmonary embolism. 2. Findings suggestive of congestive heart failure with cardiomegaly, pulmonary edema and small layering bilateral pleural effusions with adjacent atelectasis/consolidations. 3. Peripherally calcified splenic hypodensity measuring 2.7 cm, incompletely evaluated but favored sequela prior trauma. 4. Hyperdense 5 mm focus in the upper pole the right kidney which may represent a proteinaceous/hemorrhagic cyst but is incompletely evaluated on this single-phase study. Consider further evaluation with renal protocol MRI CT with and without contrast in 6 months for further characterization assessment of stability. 5. Aortic Atherosclerosis (ICD10-I70.0). Electronically Signed   By: Maudry Mayhew MD   On: 02/13/2021 20:38   DG Chest Port 1 View  Result Date: 02/13/2021 CLINICAL DATA:  Shortness of breath EXAM: PORTABLE CHEST 1 VIEW COMPARISON:  Dec 24, 2017 FINDINGS: Patient is rotated. The cardiac shadow appears slightly enlarged, at least somewhat accentuated by technique. Central vascular prominence. Mild bilateral interstitial thickening.  Slightly reduced lung volumes with hypoventilatory change. The visualized skeletal structures are unremarkable. IMPRESSION: 1. Central vascular prominence and mild bilateral interstitial thickening, possibly representing mild interstitial edema. 2. Slightly reduced lung volumes with hypoventilatory change. Electronically Signed   By: Maudry Mayhew MD   On: 02/13/2021 17:04   ECHOCARDIOGRAM COMPLETE  Result Date: 02/14/2021    ECHOCARDIOGRAM REPORT   Patient Name:   MAYKEL REITTER Date of Exam: 02/14/2021 Medical Rec #:  [id number]    Height:       71.0 in Accession #:    [id number]   Weight:       220.0 lb Date of Birth:  [birth year]    BSA:          2.196 m Patient Age:    75 years     BP:           171/90 mmHg Patient Gender:  M            HR:           84 bpm. Exam Location:  Inpatient Procedure: 2D Echo, Cardiac Doppler and Color Doppler Indications:    I50.23 Acute on chronic systolic (congestive) heart failure  History:        Patient has prior history of Echocardiogram examinations, most                 recent 06/18/2019. CHF, Arrythmias:Atrial Fibrillation and                 Atrial Flutter; Risk Factors:Hypertension, Diabetes,                 Dyslipidemia and Sleep Apnea.  Sonographer:    Elmarie Shiley Dance Referring Phys: [id number] JARED M GARDNER IMPRESSIONS  1. Left ventricular ejection fraction, by estimation, is 45 to 50%. The left ventricle has mildly decreased function. The left ventricle demonstrates regional wall motion abnormalities.     The basal-to-mid anterior and basal-to-mid anterolateral walls appear hypokinetic based on limited views. There is moderate concentric left ventricular hypertrophy. Left ventricular diastolic parameters are consistent with Grade III diastolic dysfunction (restrictive).  2. Right ventricular systolic function is normal. The right ventricular size is normal. There is normal pulmonary artery systolic pressure. The estimated right ventricular systolic pressure is 32.6 mmHg.  3.  Left atrial size was mildly dilated.  4. The mitral valve is normal in structure. Trivial mitral valve regurgitation.  5. The aortic valve is tricuspid. There is mild calcification of the aortic valve. There is mild thickening of the aortic valve. Aortic valve regurgitation is not visualized. Mild to moderate aortic valve sclerosis/calcification is present, without any evidence of aortic stenosis.  6. The inferior vena cava is dilated in size with >50% respiratory variability, suggesting right atrial pressure of 8 mmHg. Comparison(s): Compared to prior TTE on 06/2019, the LVEF appears mildly reduced 45-50% with wall motion abnormalities as detailed above. There is also G3DD currently. FINDINGS  Left Ventricle: Left ventricular ejection fraction, by estimation, is 45 to 50%. The left ventricle has mildly decreased function. The left ventricle demonstrates regional wall motion abnormalities. The basal-to-mid anterior and basal-to-mid anterolateral walls appear hypokinetic based on limited views. The left ventricular internal cavity size was normal in size. There is moderate concentric left ventricular hypertrophy. Left ventricular diastolic parameters are consistent with Grade III diastolic dysfunction (restrictive). Right Ventricle: The right ventricular size is normal. No increase in right ventricular wall thickness. Right ventricular systolic function is normal. There is normal pulmonary artery systolic pressure. The tricuspid regurgitant velocity is 2.48 m/s, and  with an assumed right atrial pressure of 8 mmHg, the estimated right ventricular systolic pressure is 32.6 mmHg. Left Atrium: Left atrial size was mildly dilated. Right Atrium: Right atrial size was normal in size. Pericardium: There is no evidence of pericardial effusion. Mitral Valve: The mitral valve is normal in structure. There is mild thickening of the mitral valve leaflet(s). There is mild calcification of the mitral valve leaflet(s). Mild mitral  annular calcification. Trivial mitral valve regurgitation. Tricuspid Valve: The tricuspid valve is normal in structure. Tricuspid valve regurgitation is trivial. Aortic Valve: The aortic valve is tricuspid. There is mild calcification of the aortic valve. There is mild thickening of the aortic valve. Aortic valve regurgitation is not visualized. Mild to moderate aortic valve sclerosis/calcification is present, without any evidence of aortic stenosis. Pulmonic Valve: The pulmonic valve was normal in structure.  Pulmonic valve regurgitation is not visualized. Aorta: The aortic root and ascending aorta are structurally normal, with no evidence of dilitation. Venous: The inferior vena cava is dilated in size with greater than 50% respiratory variability, suggesting right atrial pressure of 8 mmHg. IAS/Shunts: No atrial level shunt detected by color flow Doppler.  LEFT VENTRICLE PLAX 2D LVIDd:         4.30 cm Diastology LVIDs:         2.80 cm LV e' medial:    5.98 cm/s LV PW:         1.40 cm LV E/e' medial:  19.7 LV IVS:        1.30 cm LV e' lateral:   8.16 cm/s                        LV E/e' lateral: 14.5  RIGHT VENTRICLE             IVC RV Basal diam:  2.70 cm     IVC diam: 2.10 cm RV S prime:     12.20 cm/s TAPSE (M-mode): 2.2 cm LEFT ATRIUM              Index       RIGHT ATRIUM           Index LA diam:        3.80 cm  1.73 cm/m  RA Area:     13.80 cm LA Vol (A2C):   104.0 ml 47.36 ml/m RA Volume:   33.80 ml  15.39 ml/m LA Vol (A4C):   59.6 ml  27.14 ml/m LA Biplane Vol: 82.5 ml  37.57 ml/m   AORTA Ao Asc diam: 3.40 cm MITRAL VALVE                TRICUSPID VALVE MV Area (PHT): 3.60 cm     TR Peak grad:   24.6 mmHg MV Decel Time: 211 msec     TR Vmax:        248.00 cm/s MV E velocity: 118.00 cm/s MV A velocity: 56.90 cm/s MV E/A ratio:  2.07 Laurance FlattenHeather Pemberton MD Electronically signed by Laurance FlattenHeather Pemberton MD Signature Date/Time: 02/14/2021/2:54:50 PM    Final         Scheduled Meds:  apixaban  5 mg Oral BID    clopidogrel  75 mg Oral Daily   insulin aspart  0-15 Units Subcutaneous TID WC   insulin aspart  6 Units Subcutaneous TID WC   insulin glargine  40 Units Subcutaneous QHS   ipratropium-albuterol  3 mL Nebulization Q6H   labetalol  10 mg Intravenous Once   lisinopril  40 mg Oral Daily   metoprolol tartrate  25 mg Oral BID   pravastatin  80 mg Oral QHS   predniSONE  40 mg Oral Q breakfast   sodium chloride flush  3 mL Intravenous Q12H   Continuous Infusions:  sodium chloride       LOS: 1 day    Time spent: 35 mins.More than 50% of that time was spent in counseling and/or coordination of care.      Burnadette PopAmrit Jakhi Dishman, MD [facility] P7/07/2021, 8:07 AM

## 2021-02-15 NOTE — Discharge Instructions (Signed)
Information on my medicine - ELIQUIS® (apixaban) ° °This medication education was reviewed with me or my healthcare representative as part of my discharge preparation. ° °Why was Eliquis® prescribed for you? °Eliquis® was prescribed for you to reduce the risk of a blood clot forming that can cause a stroke if you have a medical condition called atrial fibrillation (a type of irregular heartbeat). ° °What do You need to know about Eliquis® ? °Take your Eliquis® TWICE DAILY - one tablet in the morning and one tablet in the evening with or without food. If you have difficulty swallowing the tablet whole please discuss with your pharmacist how to take the medication safely. ° °Take Eliquis® exactly as prescribed by your doctor and DO NOT stop taking Eliquis® without talking to the doctor who prescribed the medication.  Stopping may increase your risk of developing a stroke.  Refill your prescription before you run out. ° °After discharge, you should have regular check-up appointments with your healthcare provider that is prescribing your Eliquis®.  In the future your dose may need to be changed if your kidney function or weight changes by a significant amount or as you get older. ° °What do you do if you miss a dose? °If you miss a dose, take it as soon as you remember on the same day and resume taking twice daily.  Do not take more than one dose of ELIQUIS at the same time to make up a missed dose. ° °Important Safety Information °A possible side effect of Eliquis® is bleeding. You should call your healthcare provider right away if you experience any of the following: °? Bleeding from an injury or your nose that does not stop. °? Unusual colored urine (red or dark [place]) or unusual colored stools (red or black). °? Unusual bruising for unknown reasons. °? A serious fall or if you hit your head (even if there is no bleeding). ° °Some medicines may interact with Eliquis® and might increase your risk of bleeding or  clotting while on Eliquis®. To help avoid this, consult your healthcare provider or pharmacist prior to using any new prescription or non-prescription medications, including herbals, vitamins, non-steroidal anti-inflammatory drugs (NSAIDs) and supplements. ° °This website has more information on Eliquis® (apixaban): http://www.eliquis.com/eliquis/home ° °

## 2021-02-15 NOTE — Progress Notes (Signed)
Pts daughter , Flavia Shipper can be reached at [phone]

## 2021-02-15 NOTE — Progress Notes (Signed)
DAILY PROGRESS NOTE   Patient Name: Caleb Cuevas Date of Encounter: 02/15/2021 Cardiologist: None  Chief Complaint   No complaints  Patient Profile   Caleb Cuevas is a 75 y.o. 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  Subjective   Feels better today - maintaining sinus overnight. Creatinine is worse today - He was on lasix until last night - d/c'd today.  Objective   Vitals:   02/15/21 0341 02/15/21 0402 02/15/21 0746 02/15/21 0803  BP: (!) 167/67   135/60  Pulse:   73 80  Resp:   18 18  Temp: 98.1 F (36.7 C)     TempSrc: Oral     SpO2:   99% 100%  Weight:  99.7 kg    Height:        Intake/Output Summary (Last 24 hours) at 02/15/2021 0851 Last data filed at 02/15/2021 0300 Gross per 24 hour  Intake --  Output 400 ml  Net -400 ml   Filed Weights   02/13/21 1630 02/15/21 0402  Weight: 99.8 kg 99.7 kg    Physical Exam   General appearance: alert and no distress Neck: JVD - 1 cm above sternal notch, no carotid bruit, and supple, symmetrical, trachea midline Lungs: clear to auscultation bilaterally Heart: regular rate and rhythm Abdomen: soft, non-tender; bowel sounds normal; no masses,  no organomegaly and obese Extremities: edema trivial ankle Pulses: 2+ and symmetric Skin: Skin color, texture, turgor normal. No rashes or lesions Neurologic: Grossly normal Psych: Pleasant  Inpatient Medications    Scheduled Meds:  apixaban  5 mg Oral BID   clopidogrel  75 mg Oral Daily   insulin aspart  0-15 Units Subcutaneous TID WC   insulin aspart  6 Units Subcutaneous TID WC   insulin glargine  40 Units Subcutaneous QHS   ipratropium-albuterol  3 mL Nebulization Q6H   labetalol  10 mg Intravenous Once   lisinopril  40 mg Oral Daily   metoprolol tartrate  25 mg Oral  BID   pravastatin  80 mg Oral QHS   predniSONE  40 mg Oral Q breakfast   sodium chloride flush  3 mL Intravenous Q12H    Continuous Infusions:  sodium chloride      PRN Meds: sodium chloride, acetaminophen, meclizine, ondansetron (ZOFRAN) IV, sodium chloride flush   Labs   Results for orders placed or performed during the hospital encounter of 02/13/21 (from the past 48 hour(s))  Basic metabolic panel     Status: Abnormal   Collection Time: 02/13/21  4:27 PM  Result Value Ref Range   Sodium 139 135 - 145 mmol/L   Potassium 3.3 (L) 3.5 - 5.1 mmol/L   Chloride 102 98 - 111 mmol/L   CO2 27 22 - 32 mmol/L   Glucose, Bld 202 (H) 70 - 99 mg/dL    Comment: Glucose reference range applies only to samples taken after fasting for at least 8 hours.   BUN 16 8 - 23 mg/dL   Creatinine, Ser 4.31 (H) 0.61 - 1.24 mg/dL   Calcium 8.0 (L) 8.9 - 10.3 mg/dL   GFR, Estimated 37 (L) >60 mL/min    Comment: (NOTE) Calculated using the CKD-EPI Creatinine Equation (2021)    Anion gap 10 5 - 15    Comment: Performed at [facility], [address] [place], [place] [address]  CBC with Differential     Status: Abnormal   Collection Time: 02/13/21  4:27 PM  Result Value Ref Range   WBC 10.6 (H) 4.0 - 10.5 K/uL   RBC 4.83 4.22 - 5.81 MIL/uL   Hemoglobin 13.6 13.0 - 17.0 g/dL   HCT 78.442.5 69.639.0 - 29.552.0 %   MCV 88.0 80.0 - 100.0 fL   MCH 28.2 26.0 - 34.0 pg   MCHC 32.0 30.0 - 36.0 g/dL   RDW 28.413.9 13.211.5 - 44.015.5 %   Platelets 179 150 - 400 K/uL   nRBC 0.0 0.0 - 0.2 %   Neutrophils Relative % 65 %   Neutro Abs 6.9 1.7 - 7.7 K/uL   Lymphocytes Relative 23 %   Lymphs Abs 2.5 0.7 - 4.0 K/uL   Monocytes Relative 8 %   Monocytes Absolute 0.8 0.1 - 1.0 K/uL   Eosinophils Relative 3 %   Eosinophils Absolute 0.3 0.0 - 0.5 K/uL   Basophils Relative 0 %   Basophils Absolute 0.0 0.0 - 0.1 K/uL   Immature Granulocytes 1 %   Abs Immature Granulocytes 0.06 0.00 - 0.07 K/uL    Comment: Performed at [facility], [address] [place], [place] [address]  Brain natriuretic peptide     Status: Abnormal   Collection Time: 02/13/21  4:27 PM  Result Value Ref Range   B Natriuretic Peptide 204.4 (H) 0.0 - 100.0 pg/mL    Comment: Performed at [facility], [address] [place], [place] [address]  Troponin I (High Sensitivity)     Status: Abnormal   Collection Time: 02/13/21  4:27 PM  Result Value Ref Range   Troponin I (High Sensitivity) 45 (H) <18 ng/L    Comment: (NOTE) Elevated high sensitivity troponin I (hsTnI) values and significant  changes across serial measurements may suggest ACS but many other  chronic and acute conditions are known to elevate hsTnI results.  Refer to the "Links" section for chest pain algorithms and additional  guidance. Performed at [facility], [address] [place], [place] [address]   Resp Panel by RT-PCR (Flu A&B, Covid) Nasopharyngeal Swab     Status: None   Collection Time: 02/13/21  4:29 PM   Specimen: Nasopharyngeal Swab; Nasopharyngeal(NP) swabs in vial transport medium  Result Value Ref Range   SARS Coronavirus 2 by RT PCR NEGATIVE NEGATIVE    Comment: (NOTE) SARS-CoV-2 target nucleic acids are NOT DETECTED.  The SARS-CoV-2 RNA is generally detectable in upper respiratory specimens during the acute phase of infection. The lowest concentration of SARS-CoV-2 viral copies this assay can detect is 138 copies/mL. A negative result does not preclude SARS-Cov-2 infection and should not be used as the sole basis for treatment or other patient management decisions. A negative result may occur with  improper specimen collection/handling, submission of specimen other than nasopharyngeal swab, presence of viral mutation(s) within the areas targeted by this assay, and inadequate number of viral copies(<138 copies/mL). A negative result must be combined with clinical observations, patient history, and epidemiological information. The  expected result is Negative.  Fact Sheet for Patients:  BloggerCourse.comhttps://www.fda.gov/media/152166/download  Fact Sheet for Healthcare Providers:  SeriousBroker.ithttps://www.fda.gov/media/152162/download  This test is no t yet approved or cleared by the [place] FDA and  has been authorized for detection and/or diagnosis of SARS-CoV-2 by FDA under an Emergency Use Authorization (EUA). This EUA will remain  in effect (meaning this test can  be used) for the duration of the COVID-19 declaration under Section 564(b)(1) of the Act, 21 U.S.C.section 360bbb-3(b)(1), unless the authorization is terminated  or revoked sooner.       Influenza A by PCR NEGATIVE NEGATIVE   Influenza B by PCR NEGATIVE NEGATIVE    Comment: (NOTE) The Xpert Xpress SARS-CoV-2/FLU/RSV plus assay is intended as an aid in the diagnosis of influenza from Nasopharyngeal swab specimens and should not be used as a sole basis for treatment. Nasal washings and aspirates are unacceptable for Xpert Xpress SARS-CoV-2/FLU/RSV testing.  Fact Sheet for Patients: BloggerCourse.com  Fact Sheet for Healthcare Providers: SeriousBroker.it  This test is not yet approved or cleared by the [place] FDA and has been authorized for detection and/or diagnosis of SARS-CoV-2 by FDA under an Emergency Use Authorization (EUA). This EUA will remain in effect (meaning this test can be used) for the duration of the COVID-19 declaration under Section 564(b)(1) of the Act, 21 U.S.C. section 360bbb-3(b)(1), unless the authorization is terminated or revoked.  Performed at [facility], [address], [place], [place] [address]   Lactic acid, plasma     Status: Abnormal   Collection Time: 02/13/21  4:30 PM  Result Value Ref Range   Lactic Acid, Venous 2.9 (HH) 0.5 - 1.9 mmol/L    Comment: CRITICAL RESULT CALLED TO, READ BACK BY AND VERIFIED WITH: MEGAN RUGGIERO [email or web address]  ON 7.10.22 BY TCALDWELL  MT. Performed at [facility], [address], [place], [place] [address]   I-Stat arterial blood gas, ED     Status: Abnormal   Collection Time: 02/13/21  4:58 PM  Result Value Ref Range   pH, Arterial 7.356 7.350 - 7.450   pCO2 arterial 50.1 (H) 32.0 - 48.0 mmHg   pO2, Arterial 91 83.0 - 108.0 mmHg   Bicarbonate 28.1 (H) 20.0 - 28.0 mmol/L   TCO2 30 22 - 32 mmol/L   O2 Saturation 97.0 %   Acid-Base Excess 2.0 0.0 - 2.0 mmol/L   Sodium 140 135 - 145 mmol/L   Potassium 3.3 (L) 3.5 - 5.1 mmol/L   Calcium, Ion 1.15 1.15 - 1.40 mmol/L   HCT 38.0 (L) 39.0 - 52.0 %   Hemoglobin 12.9 (L) 13.0 - 17.0 g/dL   Patient temperature 09.8 F    Collection site Radial    Drawn by RT    Sample type ARTERIAL   Lactic acid, plasma     Status: None   Collection Time: 02/13/21  6:00 PM  Result Value Ref Range   Lactic Acid, Venous 1.5 0.5 - 1.9 mmol/L    Comment: Performed at [facility], [address], [place], [place] [address]  Troponin I (High Sensitivity)     Status: Abnormal   Collection Time: 02/13/21  6:00 PM  Result Value Ref Range   Troponin I (High Sensitivity) 53 (H) <18 ng/L    Comment: (NOTE) Elevated high sensitivity troponin I (hsTnI) values and significant  changes across serial measurements may suggest ACS but many other  chronic and acute conditions are known to elevate hsTnI results.  Refer to the "Links" section for chest pain algorithms and additional  guidance. Performed at [facility], [address], [place], [place] [address]   Basic metabolic panel     Status: Abnormal   Collection Time: 02/14/21  5:00 AM  Result Value Ref Range   Sodium 137 135 - 145 mmol/L   Potassium 3.9 3.5 - 5.1  mmol/L   Chloride 100 98 - 111 mmol/L   CO2 28 22 - 32 mmol/L   Glucose, Bld 92 70 - 99 mg/dL    Comment: Glucose reference range applies only to samples taken after fasting for at least 8 hours.   BUN 16 8 - 23 mg/dL   Creatinine, Ser 4.65 (H) 0.61 - 1.24  mg/dL   Calcium 8.2 (L) 8.9 - 10.3 mg/dL   GFR, Estimated 38 (L) >60 mL/min    Comment: (NOTE) Calculated using the CKD-EPI Creatinine Equation (2021)    Anion gap 9 5 - 15    Comment: Performed at [facility], [address], [place], [place] [address]  CBG monitoring, ED     Status: None   Collection Time: 02/14/21  7:56 AM  Result Value Ref Range   Glucose-Capillary 89 70 - 99 mg/dL    Comment: Glucose reference range applies only to samples taken after fasting for at least 8 hours.  CBG monitoring, ED     Status: Abnormal   Collection Time: 02/14/21 11:54 AM  Result Value Ref Range   Glucose-Capillary 149 (H) 70 - 99 mg/dL    Comment: Glucose reference range applies only to samples taken after fasting for at least 8 hours.   Comment 1 Notify RN    Comment 2 Document in Chart   CBG monitoring, ED     Status: Abnormal   Collection Time: 02/14/21 12:20 PM  Result Value Ref Range   Glucose-Capillary 133 (H) 70 - 99 mg/dL    Comment: Glucose reference range applies only to samples taken after fasting for at least 8 hours.  Glucose, capillary     Status: Abnormal   Collection Time: 02/14/21  4:26 PM  Result Value Ref Range   Glucose-Capillary 111 (H) 70 - 99 mg/dL    Comment: Glucose reference range applies only to samples taken after fasting for at least 8 hours.  Basic metabolic panel     Status: Abnormal   Collection Time: 02/15/21  3:36 AM  Result Value Ref Range   Sodium 139 135 - 145 mmol/L   Potassium 4.7 3.5 - 5.1 mmol/L    Comment: NON HEMOLYTIC   Chloride 100 98 - 111 mmol/L   CO2 32 22 - 32 mmol/L   Glucose, Bld 197 (H) 70 - 99 mg/dL    Comment: Glucose reference range applies only to samples taken after fasting for at least 8 hours.   BUN 26 (H) 8 - 23 mg/dL   Creatinine, Ser 5.68 (H) 0.61 - 1.24 mg/dL   Calcium 8.6 (L) 8.9 - 10.3 mg/dL   GFR, Estimated 29 (L) >60 mL/min    Comment: (NOTE) Calculated using the CKD-EPI Creatinine Equation (2021)    Anion  gap 7 5 - 15    Comment: Performed at [facility], [address], [place], [place] [address]  CBC with Differential/Platelet     Status: Abnormal   Collection Time: 02/15/21  3:36 AM  Result Value Ref Range   WBC 11.1 (H) 4.0 - 10.5 K/uL   RBC 4.68 4.22 - 5.81 MIL/uL   Hemoglobin 13.0 13.0 - 17.0 g/dL   HCT 70.0 17.4 - 94.4 %   MCV 87.0 80.0 - 100.0 fL   MCH 27.8 26.0 - 34.0 pg   MCHC 31.9 30.0 - 36.0 g/dL   RDW 96.7 59.1 - 63.8 %   Platelets 169 150 - 400 K/uL   nRBC 0.0 0.0 -  0.2 %   Neutrophils Relative % 76 %   Neutro Abs 8.4 (H) 1.7 - 7.7 K/uL   Lymphocytes Relative 14 %   Lymphs Abs 1.5 0.7 - 4.0 K/uL   Monocytes Relative 10 %   Monocytes Absolute 1.1 (H) 0.1 - 1.0 K/uL   Eosinophils Relative 0 %   Eosinophils Absolute 0.0 0.0 - 0.5 K/uL   Basophils Relative 0 %   Basophils Absolute 0.0 0.0 - 0.1 K/uL   Immature Granulocytes 0 %   Abs Immature Granulocytes 0.03 0.00 - 0.07 K/uL    Comment: Performed at [facility], [address] [place], [place] [address]  Hemoglobin A1c     Status: Abnormal   Collection Time: 02/15/21  3:36 AM  Result Value Ref Range   Hgb A1c MFr Bld 6.8 (H) 4.8 - 5.6 %    Comment: (NOTE) Pre diabetes:          5.7%-6.4%  Diabetes:              >6.4%  Glycemic control for   <7.0% adults with diabetes    Mean Plasma Glucose 148.46 mg/dL    Comment: Performed at [facility], [address], [place], [place] [address]  TSH     Status: None   Collection Time: 02/15/21  3:36 AM  Result Value Ref Range   TSH 1.933 0.350 - 4.500 uIU/mL    Comment: Performed by a 3rd Generation assay with a functional sensitivity of <=0.01 uIU/mL. Performed at [facility], [address], [place], [place] [address]   Glucose, capillary     Status: Abnormal   Collection Time: 02/15/21  7:58 AM  Result Value Ref Range   Glucose-Capillary 111 (H) 70 - 99 mg/dL    Comment: Glucose reference range applies only to samples taken after  fasting for at least 8 hours.    ECG   N/A  Telemetry   Sinus rhythm- Personally Reviewed  Radiology    CT Head Wo Contrast  Result Date: 02/13/2021 CLINICAL DATA:  Altered mental status EXAM: CT HEAD WITHOUT CONTRAST TECHNIQUE: Contiguous axial images were obtained from the base of the skull through the vertex without intravenous contrast. COMPARISON:  06/18/2019 FINDINGS: Brain: No evidence of acute infarction, hemorrhage, hydrocephalus, extra-axial collection or mass lesion/mass effect. Mild atrophic and chronic white matter ischemic changes are noted. 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 Vascular: No hyperdense vessel or unexpected calcification. Skull: Normal. Negative for fracture or focal lesion. Sinuses/Orbits: No acute finding. Other: None. IMPRESSION: Chronic atrophic and ischemic changes without acute abnormality. Electronically Signed   By: Alcide Clever M.D.   On: 02/13/2021 20:31   CT Angio Chest PE W/Cm &/Or Wo Cm  Result Date: 02/13/2021 CLINICAL DATA:  Shortness of breath EXAM: CT ANGIOGRAPHY CHEST WITH CONTRAST TECHNIQUE: Multidetector CT imaging of the chest was performed using the standard protocol during bolus administration of intravenous contrast. Multiplanar CT image reconstructions and MIPs were obtained to evaluate the vascular anatomy. CONTRAST:  50mL OMNIPAQUE IOHEXOL 350 MG/ML SOLN COMPARISON:  Chest radiograph Dec 24, 2017 FINDINGS: Cardiovascular: Satisfactory opacification of the pulmonary arteries to the segmental level. No evidence of pulmonary embolism. Gas in the pulmonary outflow tract related to vascular access. Aortic atherosclerosis without aneurysmal dilation. Mild cardiac enlargement. No pericardial significant effusion/thickening. No suspicious intracardiac filling defect. Mediastinum/Nodes: No discrete thyroid nodule. No pathologically enlarged mediastinal, hilar or axillary lymph nodes. The trachea  esophagus are  grossly unremarkable. Lungs/Pleura: Layering small bilateral pleural effusions with adjacent airspace consolidations/atelectasis. Mild diffuse interstitial thickening with adjacent ground-glass opacities. Upper Abdomen: Peripherally calcified splenic hypodensity measuring 2.7 cm, incompletely evaluated but favored sequela prior trauma. Thickening of adrenal glands without discrete nodularity, favor hyperplasia. Hypodense 5 mm focus in the upper pole the right kidney which may represent a proteinaceous/hemorrhagic cyst but is incompletely evaluated on this study. Musculoskeletal: Multilevel degenerative changes spine. Degenerative changes of the bilateral shoulders. No acute osseous. Review of the MIP images confirms the above findings. IMPRESSION: 1. No evidence of pulmonary embolism. 2. Findings suggestive of congestive heart failure with cardiomegaly, pulmonary edema and small layering bilateral pleural effusions with adjacent atelectasis/consolidations. 3. Peripherally calcified splenic hypodensity measuring 2.7 cm, incompletely evaluated but favored sequela prior trauma. 4. Hyperdense 5 mm focus in the upper pole the right kidney which may represent a proteinaceous/hemorrhagic cyst but is incompletely evaluated on this single-phase study. Consider further evaluation with renal protocol MRI CT with and without contrast in 6 months for further characterization assessment of stability. 5. Aortic Atherosclerosis (ICD10-I70.0). Electronically Signed   By: Maudry Mayhew MD   On: 02/13/2021 20:38   DG Chest Port 1 View  Result Date: 02/13/2021 CLINICAL DATA:  Shortness of breath EXAM: PORTABLE CHEST 1 VIEW COMPARISON:  Dec 24, 2017 FINDINGS: Patient is rotated. The cardiac shadow appears slightly enlarged, at least somewhat accentuated by technique. Central vascular prominence. Mild bilateral interstitial thickening. Slightly reduced lung volumes with hypoventilatory change. The visualized skeletal structures are  unremarkable. IMPRESSION: 1. Central vascular prominence and mild bilateral interstitial thickening, possibly representing mild interstitial edema. 2. Slightly reduced lung volumes with hypoventilatory change. Electronically Signed   By: Maudry Mayhew MD   On: 02/13/2021 17:04   ECHOCARDIOGRAM COMPLETE  Result Date: 02/14/2021    ECHOCARDIOGRAM REPORT   Patient Name:   BOWEN KIA Date of Exam: 02/14/2021 Medical Rec #:  [id number]    Height:       71.0 in Accession #:    [id number]   Weight:       220.0 lb Date of Birth:  [birth year]    BSA:          2.196 m Patient Age:    75 years     BP:           171/90 mmHg Patient Gender: M            HR:           84 bpm. Exam Location:  Inpatient Procedure: 2D Echo, Cardiac Doppler and Color Doppler Indications:    I50.23 Acute on chronic systolic (congestive) heart failure  History:        Patient has prior history of Echocardiogram examinations, most                 recent 06/18/2019. CHF, Arrythmias:Atrial Fibrillation and                 Atrial Flutter; Risk Factors:Hypertension, Diabetes,                 Dyslipidemia and Sleep Apnea.  Sonographer:    Elmarie Shiley Dance Referring Phys: 7 JARED M GARDNER IMPRESSIONS  1. Left ventricular ejection fraction, by estimation, is 45 to 50%. The left ventricle has mildly decreased function. The left ventricle demonstrates regional wall motion abnormalities.     The basal-to-mid anterior and basal-to-mid anterolateral walls appear hypokinetic based on limited views. There is moderate concentric left  ventricular hypertrophy. Left ventricular diastolic parameters are consistent with Grade III diastolic dysfunction (restrictive).  2. Right ventricular systolic function is normal. The right ventricular size is normal. There is normal pulmonary artery systolic pressure. The estimated right ventricular systolic pressure is 32.6 mmHg.  3. Left atrial size was mildly dilated.  4. The mitral valve is normal in structure. Trivial mitral  valve regurgitation.  5. The aortic valve is tricuspid. There is mild calcification of the aortic valve. There is mild thickening of the aortic valve. Aortic valve regurgitation is not visualized. Mild to moderate aortic valve sclerosis/calcification is present, without any evidence of aortic stenosis.  6. The inferior vena cava is dilated in size with >50% respiratory variability, suggesting right atrial pressure of 8 mmHg. Comparison(s): Compared to prior TTE on 06/2019, the LVEF appears mildly reduced 45-50% with wall motion abnormalities as detailed above. There is also G3DD currently. FINDINGS  Left Ventricle: Left ventricular ejection fraction, by estimation, is 45 to 50%. The left ventricle has mildly decreased function. The left ventricle demonstrates regional wall motion abnormalities. The basal-to-mid anterior and basal-to-mid anterolateral walls appear hypokinetic based on limited views. The left ventricular internal cavity size was normal in size. There is moderate concentric left ventricular hypertrophy. Left ventricular diastolic parameters are consistent with Grade III diastolic dysfunction (restrictive). Right Ventricle: The right ventricular size is normal. No increase in right ventricular wall thickness. Right ventricular systolic function is normal. There is normal pulmonary artery systolic pressure. The tricuspid regurgitant velocity is 2.48 m/s, and  with an assumed right atrial pressure of 8 mmHg, the estimated right ventricular systolic pressure is 32.6 mmHg. Left Atrium: Left atrial size was mildly dilated. Right Atrium: Right atrial size was normal in size. Pericardium: There is no evidence of pericardial effusion. Mitral Valve: The mitral valve is normal in structure. There is mild thickening of the mitral valve leaflet(s). There is mild calcification of the mitral valve leaflet(s). Mild mitral annular calcification. Trivial mitral valve regurgitation. Tricuspid Valve: The tricuspid valve  is normal in structure. Tricuspid valve regurgitation is trivial. Aortic Valve: The aortic valve is tricuspid. There is mild calcification of the aortic valve. There is mild thickening of the aortic valve. Aortic valve regurgitation is not visualized. Mild to moderate aortic valve sclerosis/calcification is present, without any evidence of aortic stenosis. Pulmonic Valve: The pulmonic valve was normal in structure. Pulmonic valve regurgitation is not visualized. Aorta: The aortic root and ascending aorta are structurally normal, with no evidence of dilitation. Venous: The inferior vena cava is dilated in size with greater than 50% respiratory variability, suggesting right atrial pressure of 8 mmHg. IAS/Shunts: No atrial level shunt detected by color flow Doppler.  LEFT VENTRICLE PLAX 2D LVIDd:         4.30 cm Diastology LVIDs:         2.80 cm LV e' medial:    5.98 cm/s LV PW:         1.40 cm LV E/e' medial:  19.7 LV IVS:        1.30 cm LV e' lateral:   8.16 cm/s                        LV E/e' lateral: 14.5  RIGHT VENTRICLE             IVC RV Basal diam:  2.70 cm     IVC diam: 2.10 cm RV S prime:     12.20 cm/s TAPSE (M-mode):  2.2 cm LEFT ATRIUM              Index       RIGHT ATRIUM           Index LA diam:        3.80 cm  1.73 cm/m  RA Area:     13.80 cm LA Vol (A2C):   104.0 ml 47.36 ml/m RA Volume:   33.80 ml  15.39 ml/m LA Vol (A4C):   59.6 ml  27.14 ml/m LA Biplane Vol: 82.5 ml  37.57 ml/m   AORTA Ao Asc diam: 3.40 cm MITRAL VALVE                TRICUSPID VALVE MV Area (PHT): 3.60 cm     TR Peak grad:   24.6 mmHg MV Decel Time: 211 msec     TR Vmax:        248.00 cm/s MV E velocity: 118.00 cm/s MV A velocity: 56.90 cm/s MV E/A ratio:  2.07 Laurance Flatten MD Electronically signed by Laurance Flatten MD Signature Date/Time: 02/14/2021/2:54:50 PM    Final     Cardiac Studies   See echo above  Assessment   Principal Problem:   Acute pulmonary edema (HCC) Active Problems:   Diabetes mellitus type  2 in nonobese (HCC)   Essential hypertension   HLD (hyperlipidemia)   Narrow complex tachycardia (HCC)   CKD (chronic kidney disease) stage 3, GFR 30-59 ml/min (HCC)   New onset of congestive heart failure (HCC)   Diastolic CHF, acute (HCC)   A-fib (HCC)   Atrial flutter (HCC)   Hypoxia   OSA (obstructive sleep apnea)   Acute respiratory failure with hypoxia [facility])   Plan   Mr. Mcinerny says he feels well today - I's/O's not accurate, however, he says he has been urinating a lot - may explain jump in creatinine. Diuretic held today by hospitalist. He remains on lisinopril - if creatinine continues to worsen, may wish to hold this. Echo showed mild decrease in LVEF to 45-50%, regional WMA's - creatinine will not support invasive ischemia evaluation and it was noted he had only mild CAD by cath just a few years ago. Would favor medical therapy. Could add hydralazine/nitrate (BIDIL) for afterload reduction if lisinopril is not tolerated.  Time Spent Directly with Patient:  I have spent a total of 25 minutes with the patient reviewing hospital notes, telemetry, EKGs, labs and examining the patient as well as establishing an assessment and plan that was discussed personally with the patient.  > 50% of time was spent in direct patient care.  Length of Stay:  LOS: 1 day   Chrystie Nose, MD, [facility], FACP    [facility]  Medical Director of the [facility] Diplomate of the American Board of Clinical Lipidology Attending Cardiologist  Direct Dial: [phone]  Fax: [phone]  Website:  [email or web address] 02/15/2021, 8:51 AM

## 2021-02-16 ENCOUNTER — Other Ambulatory Visit (HOSPITAL_COMMUNITY): Payer: Self-pay

## 2021-02-16 LAB — GLUCOSE, CAPILLARY
Glucose-Capillary: 152 mg/dL — ABNORMAL HIGH (ref 70–99)
Glucose-Capillary: 99 mg/dL (ref 70–99)
Glucose-Capillary: 109 mg/dL — ABNORMAL HIGH (ref 70–99)
Glucose-Capillary: 305 mg/dL — ABNORMAL HIGH (ref 70–99)

## 2021-02-16 LAB — BASIC METABOLIC PANEL
Anion gap: 8 (ref 5–15)
BUN: 36 mg/dL — ABNORMAL HIGH (ref 8–23)
CO2: 31 mmol/L (ref 22–32)
Calcium: 8.6 mg/dL — ABNORMAL LOW (ref 8.9–10.3)
Chloride: 97 mmol/L — ABNORMAL LOW (ref 98–111)
Creatinine, Ser: 2.3 mg/dL — ABNORMAL HIGH (ref 0.61–1.24)
GFR, Estimated: 29 mL/min — ABNORMAL LOW (ref >60)
Glucose, Bld: 219 mg/dL — ABNORMAL HIGH (ref 70–99)
Potassium: 4.5 mmol/L (ref 3.5–5.1)
Sodium: 136 mmol/L (ref 135–145)

## 2021-02-16 MED ORDER — ISOSORBIDE MONONITRATE ER 30 MG PO TB24
30.0000 mg | ORAL_TABLET | Freq: Every day | ORAL | Status: DC
  Administered 2021-02-16 – 2021-02-17 (×2): 30 mg via ORAL
  Filled 2021-02-16 (×2): qty 1

## 2021-02-16 MED ORDER — FUROSEMIDE 10 MG/ML IJ SOLN
60.0000 mg | INTRAMUSCULAR | Status: AC
  Administered 2021-02-16: 60 mg via INTRAVENOUS
  Filled 2021-02-16: qty 6

## 2021-02-16 MED ORDER — HYDRALAZINE HCL 25 MG PO TABS
25.0000 mg | ORAL_TABLET | Freq: Three times a day (TID) | ORAL | Status: DC
  Administered 2021-02-16 – 2021-02-17 (×3): 25 mg via ORAL
  Filled 2021-02-16 (×3): qty 1

## 2021-02-16 MED ORDER — ISOSORB DINITRATE-HYDRALAZINE 20-37.5 MG PO TABS
1.0000 | ORAL_TABLET | Freq: Three times a day (TID) | ORAL | Status: DC
  Filled 2021-02-16: qty 1

## 2021-02-16 MED ORDER — INSULIN ASPART 100 UNIT/ML IJ SOLN
0.0000 [IU] | Freq: Three times a day (TID) | INTRAMUSCULAR | Status: DC
  Administered 2021-02-17 (×2): 1 [IU] via SUBCUTANEOUS

## 2021-02-16 MED ORDER — IPRATROPIUM-ALBUTEROL 0.5-2.5 (3) MG/3ML IN SOLN: 3.0000 mL | Freq: Four times a day (QID) | RESPIRATORY_TRACT | Status: DC | PRN

## 2021-02-16 NOTE — Evaluation (Signed)
Occupational Therapy Evaluation Patient Details Name: Caleb Cuevas MRN: [id number] DOB: [birth year] Today's Date: 02/16/2021    History of Present Illness 75 y.o. male presenting to ED 7/10 with progressively worsening BLE edema, SOB, DOE and orthopnea x several weeks to months. Patient admitted with flash pulmonary edema and new onset CHF. PMHx significant for DMII, HTN, HLD, PAD, Hx of TIA and cerebral aneurysm.   Clinical Impression   PTA patient was living with his spouse and daughter and was grossly I with ADLs/IADLs without AD. Patient currently functioning slightly below baseline requiring supervision A grossly for ADLs and short-distance functional mobility in hospital room without AD. Patient also limited by deficits listed below including decreased activity tolerance, decreased cardiopulmonary status requiring supplemental O2 via [place], and mild balance deficits and would benefit from continued acute OT services in prep for safe d/c home. Patient likely to progress well. OT will continue to follow acutely.      Follow Up Recommendations  No OT follow up    Equipment Recommendations  None recommended by OT    Recommendations for Other Services       Precautions / Restrictions Precautions Precautions: Fall Restrictions Weight Bearing Restrictions: No      Mobility Bed Mobility Overal bed mobility: Modified Independent             General bed mobility comments: HOB elevated    Transfers Overall transfer level: Needs assistance Equipment used: None Transfers: Sit to/from Stand Sit to Stand: Supervision         General transfer comment: Supervision A for safety/line management.    Balance Overall balance assessment: Mild deficits observed, not formally tested                                         ADL either performed or assessed with clinical judgement   ADL Overall ADL's : Needs assistance/impaired     Grooming: Wash/dry hands;Wash/dry  face;Standing Grooming Details (indicate cue type and reason): Supervision A for line management only.             Lower Body Dressing: Supervision/safety;Sit to/from stand Lower Body Dressing Details (indicate cue type and reason): Supervision A for line management only Toilet Transfer: Supervision/safety Toilet Transfer Details (indicate cue type and reason): Simulated with transfer to recliner with supervision A for line management only.         Functional mobility during ADLs: Supervision/safety       Vision Baseline Vision/History: Wears glasses Wears Glasses: Reading only Patient Visual Report: No change from baseline       Perception     Praxis      Pertinent Vitals/Pain Pain Assessment: No/denies pain     Hand Dominance Right   Extremity/Trunk Assessment Upper Extremity Assessment Upper Extremity Assessment: Overall WFL for tasks assessed   Lower Extremity Assessment Lower Extremity Assessment: Defer to PT evaluation   Cervical / Trunk Assessment Cervical / Trunk Assessment: Normal   Communication Communication Communication: No difficulties   Cognition Arousal/Alertness: Awake/alert Behavior During Therapy: WFL for tasks assessed/performed Overall Cognitive Status: Within Functional Limits for tasks assessed                                     General Comments  At rest SpO2 100% on 2L O2 via [place], HR 76bpm,  BP 171/103 on R arm; 165/75 on L arm. SpO2 96% on 2L after grooming standing at sink level.    Exercises     Shoulder Instructions      Home Living Family/patient expects to be discharged to:: Private residence Living Arrangements: Spouse/significant other;Children;Other (Comment) (Daughter works during the day) Available Help at Discharge: Family Type of Home: House Home Access: Stairs to enter [employer] of Steps: 1 STE from garage Entrance Stairs-Rails: None Home Layout: Two level;Other (Comment)  (Bedroom/bathroom on main level)     Bathroom Shower/Tub: Tub/shower unit;Walk-in shower   Bathroom Toilet: Handicapped height     Home Equipment: Environmental consultant - 4 wheels;Cane - single point;Shower seat - built in;Grab bars - toilet;Grab bars - tub/shower;Hand held shower head          Prior Functioning/Environment Level of Independence: Independent        Comments: I with ADLs/IADLs without AD, family provides transportation        OT Problem List: Cardiopulmonary status limiting activity;Decreased activity tolerance;Impaired balance (sitting and/or standing)      OT Treatment/Interventions: Self-care/ADL training;Therapeutic exercise;Energy conservation;DME and/or AE instruction;Therapeutic activities;Patient/family education;Balance training    OT Goals(Current goals can be found in the care plan section) Acute Rehab OT Goals Patient Stated Goal: To return home. OT Goal Formulation: With patient Time For Goal Achievement: 03/02/21 Potential to Achieve Goals: Good ADL Goals Additional ADL Goal #1: Patient will complete ADLs with Mod I and good activity pacing. Additional ADL Goal #2: Patient will recall 3 energy conservation techniques in prep for ADLs/IADLs.  OT Frequency: Min 2X/week   Barriers to D/C:            Co-evaluation              AM-PAC OT "6 Clicks" Daily Activity     Outcome Measure Help from another person eating meals?: None Help from another person taking care of personal grooming?: A Little Help from another person toileting, which includes using toliet, bedpan, or urinal?: A Little Help from another person bathing (including washing, rinsing, drying)?: A Little Help from another person to put on and taking off regular upper body clothing?: A Little Help from another person to put on and taking off regular lower body clothing?: A Little 6 Click Score: 19   End of Session Equipment Utilized During Treatment: Gait belt;Oxygen Nurse  Communication: Mobility status;Other (comment) (Response to treatment)  Activity Tolerance: Patient tolerated treatment well Patient left: in chair;with call bell/phone within reach;with chair alarm set  OT Visit Diagnosis: Unsteadiness on feet (R26.81)                Time: 6010-9323 OT Time Calculation (min): 22 min Charges:  OT General Charges $OT Visit: 1 Visit OT Evaluation $OT Eval Low Complexity: 1 Low  Tiarra Anastacio H. OTR/L Supplemental OT, Department of rehab services [phone]  Kween Bacorn R H. 02/16/2021, 8:52 AM

## 2021-02-16 NOTE — Progress Notes (Signed)
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  Also reported bilateral lower extremity edema .  He experienced severe shortness of breath so he called EMS and he was brought to the emergency department.  On presentation his heart rate was in the range of 200s and EKG showed narrow complex tachycardia.  He was cardioverted by EMS and also given Cardizem 10 mg and then he converted to normal sinus rhythm.  CTA chest showed pulmonary edema.  Patient was admitted for the management of acute congestive heart failure.  Cardiology also following.    Assessment & Plan:   Principal Problem:   Acute pulmonary edema (HCC) Active Problems:   Diabetes mellitus type 2 in nonobese (HCC)   Essential hypertension   HLD (hyperlipidemia)   Narrow complex tachycardia (HCC)   CKD (chronic kidney disease) stage 3, GFR 30-59 ml/min (HCC)   New onset of congestive heart failure (HCC)   Diastolic CHF, acute (HCC)   A-fib (HCC)   Atrial flutter (HCC)   Hypoxia   OSA (obstructive sleep apnea)   Acute respiratory failure with hypoxia (HCC)  Acute systolic/diastolic combined congestive heart failure: No history of heart failure in the past.  Reported orthopnea, dyspnea on exertion, bilateral lower extremity edema  Monitor input/output, daily weight.  Elevated BNP on presentation.  Echo showed EF of 45 to 50%, grade 3 diastolic dysfunction. He has been given a dose of IV Lasix 60 mg again today  Acute hypoxic respiratory failure: He was apneic on arrival and required  bag valve mask.  Currently requiring 3 to 4 L of oxygen per minute.  Does not use oxygen at home.  Most likely secondary to acute congestive heart failure and also possible COPD exacerbation.  Chest imaging showed pulmonary edema.   We will try to continue to taper the oxygen.  COPD exacerbation: No documented history of COPD but he was wheezing on auscultation.  And he is a past smoker.  On prednisone, bronchodilators  Narrow complex tachycardia: Heart rate was in the range of 200s on presentation.  He was cardioverted by EMS and was also given a dose of Cardizem 10 mg IV then he converted to normal sinus rhythm.  Initial strip showed A. fib/atrial flutter.    Monitor electrolytes.  Continue metoprolol 25 mg twice daily.  He also has been started on Eliquis  Hypertension: Hypertensive today.  We will hold lisinopril due to AKI, continue metoprolol, started on hydralazine and imdur  Diabetes type 2: Takes insulin at home.  Monitor blood sugars.  Continue current insulin regimen  Hyperlipidemia: Continue statin  AKI on CKD stage IIIb:   Baseline creatinine ranges from 1.5-1.8.  Creatinine bumped up to more than 2.  Lasix on hold.  Check urine creatinine and sodium  H/O PAD/TIA:History of minimal nonocclusive coronary artery disease on left heart catheterization in 2019.  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   Debility/deconditioning: We  requested PT/OT evaluation ,no follow up recommended  DVT prophylaxis:Eliquis Code Status: Full Family Communication: Called wife on phone for update from bedside Status is: Inpatient  Remains inpatient appropriate because:Unsafe d/c plan  Dispo: The patient is from: Home              Anticipated d/c is to: Home              Patient currently is not medically stable to d/c.   Difficult to place patient No  Plan for dc tomorrow   Consultants:  Cardiology  Procedures:None  Antimicrobials:  Anti-infectives (From admission, onward)    None       Subjective: Patient seen and examined the bedside this morning.  Hemodynamically stable.  Sitting in the chair.  On 1 to 2 L of oxygen during my evaluation.  Denies any worsening cough or shortness of breath.  Eager to go home.  Objective: Vitals:   02/16/21 0028 02/16/21 0415 02/16/21 0500 02/16/21 0728  BP: (!) 164/76 (!) 183/91  (!) 171/81  Pulse:  78  76  Resp:  (!) 25  (!) 25  Temp:  98.3 F (36.8 C)  97.8 F (36.6 C)  TempSrc:  Oral  Oral  SpO2:  100%  100%  Weight:   97.9 kg   Height:        Intake/Output Summary (Last 24 hours) at 02/16/2021 0803 Last data filed at 02/15/2021 2143 Gross per 24 hour  Intake --  Output 200 ml  Net -200 ml   Filed Weights   02/13/21 1630 02/15/21 0402 02/16/21 0500  Weight: 99.8 kg 99.7 kg 97.9 kg    Examination:  General exam: Overall comfortable, not in distress,obese HEENT: PERRL Respiratory system:  no wheezes or crackles  Cardiovascular system: S1 & S2 heard, RRR.  Gastrointestinal system: Abdomen is nondistended, soft and nontender. Central nervous system: Alert and oriented Extremities: No edema, no clubbing ,no cyanosis Skin: No rashes, no ulcers,no icterus     Data Reviewed: I have personally reviewed following labs and imaging studies  CBC: Recent Labs  Lab 02/13/21 1627 02/13/21 1658 02/15/21 0336  WBC 10.6*  --  11.1*  NEUTROABS 6.9  --  8.4*  HGB 13.6 12.9* 13.0  HCT 42.5 38.0* 40.7  MCV 88.0  --  87.0  PLT 179  --  169   Basic Metabolic Panel: Recent Labs  Lab 02/13/21 1627 02/13/21 1658 02/14/21 0500 02/15/21 0336 02/16/21 0126  NA 139 140 137 139 136  K 3.3* 3.3* 3.9 4.7 4.5  CL 102  --  100 100 97*  CO2 27  --  28 32 31  GLUCOSE 202*  --  92 197* 219*  BUN 16  --  16 26* 36*  CREATININE 1.87*  --  1.83* 2.32* 2.30*  CALCIUM 8.0*  --  8.2* 8.6* 8.6*   GFR: Estimated Creatinine  Clearance: 33.6 mL/min (A) (by C-G formula based on SCr of 2.3 mg/dL (H)). Liver Function Tests: No results for input(s): AST, ALT, ALKPHOS, BILITOT, PROT, ALBUMIN in the last 168 hours. No results for input(s): LIPASE, AMYLASE in the last 168 hours. No results for input(s): AMMONIA in the last 168 hours. Coagulation Profile: No results for input(s): INR, PROTIME in the last 168 hours. Cardiac Enzymes: No results for input(s): CKTOTAL, CKMB, CKMBINDEX, TROPONINI in the last 168 hours. BNP (last 3 results) No results for input(s): PROBNP in the last 8760 hours. HbA1C: Recent Labs    02/15/21 0336  HGBA1C 6.8*   CBG: Recent Labs  Lab 02/15/21 0758 02/15/21 1208 02/15/21 1726 02/15/21 2013 02/16/21 0730  GLUCAP 111* 125* 288* 271* 152*   Lipid Profile: No results for input(s): CHOL, HDL, LDLCALC, TRIG, CHOLHDL, LDLDIRECT in the last 72 hours. Thyroid Function Tests: Recent Labs    02/15/21 0336  TSH 1.933   Anemia Panel: No results for input(s): VITAMINB12, FOLATE, FERRITIN, TIBC, IRON, RETICCTPCT in the last 72 hours. Sepsis Labs: Recent Labs  Lab 02/13/21 1630 02/13/21 1800  LATICACIDVEN 2.9* 1.5    Recent Results (from the past 240 hour(s))  Resp Panel by RT-PCR (Flu A&B, Covid) Nasopharyngeal Swab     Status: None   Collection Time: 02/13/21  4:29 PM   Specimen: Nasopharyngeal Swab; Nasopharyngeal(NP) swabs in vial transport medium  Result Value Ref Range Status   SARS Coronavirus 2 by RT PCR NEGATIVE NEGATIVE Final    Comment: (NOTE) SARS-CoV-2 target nucleic acids are NOT DETECTED.  The SARS-CoV-2 RNA is generally detectable in upper respiratory specimens during the acute phase of infection. The lowest concentration of SARS-CoV-2 viral copies this assay can detect is 138 copies/mL. A negative result does not preclude SARS-Cov-2 infection and should not be used as the sole basis for treatment or other patient management decisions. A negative result may  occur with  improper specimen collection/handling, submission of specimen other than nasopharyngeal swab, presence of viral mutation(s) within the areas targeted by this assay, and inadequate number of viral copies(<138 copies/mL). A negative result must be combined with clinical observations, patient history, and epidemiological information. The expected result is Negative.  Fact Sheet for Patients:  BloggerCourse.com  Fact Sheet for Healthcare Providers:  SeriousBroker.it  This test is no t yet approved or cleared by the [place] FDA and  has been authorized for detection and/or diagnosis of SARS-CoV-2 by FDA under an Emergency Use Authorization (EUA). This EUA will remain  in effect (meaning this test can be used) for the duration of the COVID-19 declaration under Section 564(b)(1) of the Act, 21 U.S.C.section 360bbb-3(b)(1), unless the authorization is terminated  or revoked sooner.       Influenza A by PCR NEGATIVE NEGATIVE Final   Influenza B by PCR NEGATIVE NEGATIVE Final    Comment: (NOTE) The Xpert Xpress SARS-CoV-2/FLU/RSV plus assay is intended as an aid in the diagnosis of influenza from Nasopharyngeal swab specimens and should not be used as a sole basis for treatment. Nasal washings and aspirates are unacceptable for Xpert Xpress SARS-CoV-2/FLU/RSV testing.  Fact Sheet for Patients: BloggerCourse.com  Fact Sheet for Healthcare Providers: SeriousBroker.it  This test is not yet approved or cleared by the [place] FDA and has been authorized for detection and/or diagnosis of SARS-CoV-2 by FDA under an Emergency Use Authorization (EUA). This EUA will remain in effect (meaning this test can be used) for the duration of the COVID-19 declaration under Section 564(b)(1) of the Act, 21 U.S.C. section 360bbb-3(b)(1), unless the authorization is terminated  or revoked.  Performed at [facility], [address] [place], [place] [address]          Radiology Studies: ECHOCARDIOGRAM COMPLETE  Result Date: 02/14/2021    ECHOCARDIOGRAM REPORT   Patient Name:   Caleb Cuevas Date of Exam: 02/14/2021 Medical Rec #:  [id number]    Height:       71.0 in Accession #:    [id number]   Weight:       220.0 lb Date of Birth:  09-06-1945    BSA:  2.196 m Patient Age:    75 years     BP:           171/90 mmHg Patient Gender: M            HR:           84 bpm. Exam Location:  Inpatient Procedure: 2D Echo, Cardiac Doppler and Color Doppler Indications:    I50.23 Acute on chronic systolic (congestive) heart failure  History:        Patient has prior history of Echocardiogram examinations, most                 recent 06/18/2019. CHF, Arrythmias:Atrial Fibrillation and                 Atrial Flutter; Risk Factors:Hypertension, Diabetes,                 Dyslipidemia and Sleep Apnea.  Sonographer:    Elmarie Shiley Dance Referring Phys: [id number] JARED M GARDNER IMPRESSIONS  1. Left ventricular ejection fraction, by estimation, is 45 to 50%. The left ventricle has mildly decreased function. The left ventricle demonstrates regional wall motion abnormalities.     The basal-to-mid anterior and basal-to-mid anterolateral walls appear hypokinetic based on limited views. There is moderate concentric left ventricular hypertrophy. Left ventricular diastolic parameters are consistent with Grade III diastolic dysfunction (restrictive).  2. Right ventricular systolic function is normal. The right ventricular size is normal. There is normal pulmonary artery systolic pressure. The estimated right ventricular systolic pressure is 32.6 mmHg.  3. Left atrial size was mildly dilated.  4. The mitral valve is normal in structure. Trivial mitral valve regurgitation.  5. The aortic valve is tricuspid. There is mild calcification of the aortic valve. There is mild thickening of the aortic valve.  Aortic valve regurgitation is not visualized. Mild to moderate aortic valve sclerosis/calcification is present, without any evidence of aortic stenosis.  6. The inferior vena cava is dilated in size with >50% respiratory variability, suggesting right atrial pressure of 8 mmHg. Comparison(s): Compared to prior TTE on 06/2019, the LVEF appears mildly reduced 45-50% with wall motion abnormalities as detailed above. There is also G3DD currently. FINDINGS  Left Ventricle: Left ventricular ejection fraction, by estimation, is 45 to 50%. The left ventricle has mildly decreased function. The left ventricle demonstrates regional wall motion abnormalities. The basal-to-mid anterior and basal-to-mid anterolateral walls appear hypokinetic based on limited views. The left ventricular internal cavity size was normal in size. There is moderate concentric left ventricular hypertrophy. Left ventricular diastolic parameters are consistent with Grade III diastolic dysfunction (restrictive). Right Ventricle: The right ventricular size is normal. No increase in right ventricular wall thickness. Right ventricular systolic function is normal. There is normal pulmonary artery systolic pressure. The tricuspid regurgitant velocity is 2.48 m/s, and  with an assumed right atrial pressure of 8 mmHg, the estimated right ventricular systolic pressure is 32.6 mmHg. Left Atrium: Left atrial size was mildly dilated. Right Atrium: Right atrial size was normal in size. Pericardium: There is no evidence of pericardial effusion. Mitral Valve: The mitral valve is normal in structure. There is mild thickening of the mitral valve leaflet(s). There is mild calcification of the mitral valve leaflet(s). Mild mitral annular calcification. Trivial mitral valve regurgitation. Tricuspid Valve: The tricuspid valve is normal in structure. Tricuspid valve regurgitation is trivial. Aortic Valve: The aortic valve is tricuspid. There is mild calcification of the aortic  valve. There is mild thickening of the aortic valve. Aortic  valve regurgitation is not visualized. Mild to moderate aortic valve sclerosis/calcification is present, without any evidence of aortic stenosis. Pulmonic Valve: The pulmonic valve was normal in structure. Pulmonic valve regurgitation is not visualized. Aorta: The aortic root and ascending aorta are structurally normal, with no evidence of dilitation. Venous: The inferior vena cava is dilated in size with greater than 50% respiratory variability, suggesting right atrial pressure of 8 mmHg. IAS/Shunts: No atrial level shunt detected by color flow Doppler.  LEFT VENTRICLE PLAX 2D LVIDd:         4.30 cm Diastology LVIDs:         2.80 cm LV e' medial:    5.98 cm/s LV PW:         1.40 cm LV E/e' medial:  19.7 LV IVS:        1.30 cm LV e' lateral:   8.16 cm/s                        LV E/e' lateral: 14.5  RIGHT VENTRICLE             IVC RV Basal diam:  2.70 cm     IVC diam: 2.10 cm RV S prime:     12.20 cm/s TAPSE (M-mode): 2.2 cm LEFT ATRIUM              Index       RIGHT ATRIUM           Index LA diam:        3.80 cm  1.73 cm/m  RA Area:     13.80 cm LA Vol (A2C):   104.0 ml 47.36 ml/m RA Volume:   33.80 ml  15.39 ml/m LA Vol (A4C):   59.6 ml  27.14 ml/m LA Biplane Vol: 82.5 ml  37.57 ml/m   AORTA Ao Asc diam: 3.40 cm MITRAL VALVE                TRICUSPID VALVE MV Area (PHT): 3.60 cm     TR Peak grad:   24.6 mmHg MV Decel Time: 211 msec     TR Vmax:        248.00 cm/s MV E velocity: 118.00 cm/s MV A velocity: 56.90 cm/s MV E/A ratio:  2.07 Heather Pemberton MD ElectroniLaurance Flattencally signed by Laurance FlattenHeather Pemberton MD Signature Date/Time: 02/14/2021/2:54:50 PM    Final         Scheduled Meds:  apixaban  5 mg Oral BID   clopidogrel  75 mg Oral Daily   insulin aspart  0-15 Units Subcutaneous TID WC   insulin aspart  6 Units Subcutaneous TID WC   insulin glargine  40 Units Subcutaneous QHS   ipratropium-albuterol  3 mL Nebulization Q6H    isosorbide-hydrALAZINE  1 tablet Oral TID   labetalol  10 mg Intravenous Once   metoprolol tartrate  25 mg Oral BID   pravastatin  80 mg Oral QHS   predniSONE  40 mg Oral Q breakfast   sodium chloride flush  3 mL Intravenous Q12H   Continuous Infusions:  sodium chloride       LOS: 2 days    Time spent: 25 mins.More than 50% of that time was spent in counseling and/or coordination of care.      Burnadette PopAmrit Roschelle Calandra, MD [facility] P7/13/2022, 8:03 AM

## 2021-02-16 NOTE — Evaluation (Signed)
Physical Therapy Evaluation Patient Details Name: Caleb Cuevas MRN: [id number] DOB: 10-Aug-1945 Today's Date: 02/16/2021   History of Present Illness  75 y.o. male presenting to ED 7/10 with progressively worsening BLE edema, SOB, DOE and orthopnea x several weeks to months. Patient admitted with flash pulmonary edema and new onset CHF. PMHx significant for DMII, HTN, HLD, PAD, Hx of TIA and cerebral aneurysm.  Clinical Impression   Pt admitted secondary to problem above with deficits below. PTA patient was independent with all mobility. Notes he walked slowly but denies stagger steps as occurred today.  Pt currently requires minguard assist with one imbalance/stagger step occurring. Anticipate patient will benefit from acute PT to address problems listed below.Will continue to follow acutely to maximize functional mobility independence and safety.       Follow Up Recommendations No PT follow up    Equipment Recommendations  None recommended by PT    Recommendations for Other Services       Precautions / Restrictions Precautions Precautions: Fall Restrictions Weight Bearing Restrictions: No      Mobility  Bed Mobility Overal bed mobility: Modified Independent             General bed mobility comments: HOB elevated    Transfers Overall transfer level: Modified independent Equipment used: None Transfers: Sit to/from Stand Sit to Stand: Modified independent (Device/Increase time)         General transfer comment: Supervision A for safety/line management.  Ambulation/Gait Ambulation/Gait assistance: Min guard Gait Distance (Feet): 200 Feet Assistive device: None Gait Pattern/deviations: Step-through pattern;Decreased stride length;Wide base of support Gait velocity: decr but his baseline per pt Gait velocity interpretation: 1.31 - 2.62 ft/sec, indicative of limited community ambulator General Gait Details: pt with staggering LOB x 1 as his wife road up to him in a  transport chair; pt reported she startled him as he wasn't expecting it to be her  Information systems manager [place] (Stroke Patients Only)       Balance Overall balance assessment: Mild deficits observed, not formally tested                                           Pertinent Vitals/Pain Pain Assessment: No/denies pain    Home Living Family/patient expects to be discharged to:: Private residence Living Arrangements: Spouse/significant other;Children;Other (Comment) (Daughter works during the day) Available Help at Discharge: Family Type of Home: House Home Access: Stairs to enter Entrance Stairs-Rails: None Entrance Stairs-Number of Steps: 1 STE from garage Home Layout: Two level;Other (Comment) (Bedroom/bathroom on main level) Home Equipment: Walker - 4 wheels;Cane - single point;Shower seat - built in;Grab bars - toilet;Grab bars - tub/shower;Hand held shower head      Prior Function Level of Independence: Independent         Comments: I with ADLs/IADLs without AD, family provides transportation     Hand Dominance   Dominant Hand: Right    Extremity/Trunk Assessment   Upper Extremity Assessment Upper Extremity Assessment: Defer to OT evaluation    Lower Extremity Assessment Lower Extremity Assessment: Overall WFL for tasks assessed    Cervical / Trunk Assessment Cervical / Trunk Assessment: Normal  Communication   Communication: No difficulties  Cognition Arousal/Alertness: Awake/alert Behavior During Therapy: WFL for tasks assessed/performed Overall Cognitive Status: Within Functional Limits for  tasks assessed                                        General Comments General comments (skin integrity, edema, etc.): on room air with sats at lowest 91%, at rest 95% on RA    Exercises     Assessment/Plan    PT Assessment Patient needs continued PT services  PT Problem List Decreased  balance;Decreased mobility       PT Treatment Interventions DME instruction;Gait training;Stair training;Functional mobility training;Therapeutic activities;Therapeutic exercise;Balance training;Patient/family education    PT Goals (Current goals can be found in the Care Plan section)  Acute Rehab PT Goals Patient Stated Goal: To return home. PT Goal Formulation: With patient Time For Goal Achievement: 03/02/21 Potential to Achieve Goals: Good    Frequency Min 3X/week   Barriers to discharge        Co-evaluation               AM-PAC PT "6 Clicks" Mobility  Outcome Measure Help needed turning from your back to your side while in a flat bed without using bedrails?: None Help needed moving from lying on your back to sitting on the side of a flat bed without using bedrails?: None Help needed moving to and from a bed to a chair (including a wheelchair)?: None Help needed standing up from a chair using your arms (e.g., wheelchair or bedside chair)?: None Help needed to walk in hospital room?: A Little Help needed climbing 3-5 steps with a railing? : A Little 6 Click Score: 22    End of Session   Activity Tolerance: Patient tolerated treatment well Patient left: in chair;with call bell/phone within reach;with chair alarm set;with family/visitor present Nurse Communication: Mobility status;Other (comment) (on room air at end of session) PT Visit Diagnosis: Unsteadiness on feet (R26.81)    Time: 1049-1100 PT Time Calculation (min) (ACUTE ONLY): 11 min   Charges:   PT Evaluation $PT Eval Low Complexity: 1 Low           Jerolyn Center, PT Pager [phone]   [name] 02/16/2021, 11:10 AM

## 2021-02-16 NOTE — Progress Notes (Signed)
Cardiology f/u arranged and placed on AVS. °

## 2021-02-16 NOTE — Progress Notes (Signed)
DAILY PROGRESS NOTE   Patient Name: Caleb Cuevas Date of Encounter: 02/16/2021 Cardiologist: None  Chief Complaint   Dyspneic  Patient Profile   Caleb Cuevas is a 75 y.o. 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  Subjective   Short of breath today when getting up to use the restroom off oxygen - BP remains elevated. Creatinine has stabilized at 2.3, however, diuretics were held  Objective   Vitals:   02/16/21 0028 02/16/21 0415 02/16/21 0500 02/16/21 0728  BP: (!) 164/76 (!) 183/91  (!) 171/81  Pulse:  78  76  Resp:  (!) 25  (!) 25  Temp:  98.3 F (36.8 C)  97.8 F (36.6 C)  TempSrc:  Oral  Oral  SpO2:  100%  100%  Weight:   97.9 kg   Height:        Intake/Output Summary (Last 24 hours) at 02/16/2021 4742 Last data filed at 02/15/2021 2143 Gross per 24 hour  Intake --  Output 200 ml  Net -200 ml   Filed Weights   02/13/21 1630 02/15/21 0402 02/16/21 0500  Weight: 99.8 kg 99.7 kg 97.9 kg    Physical Exam   General appearance: alert and no distress Neck: no carotid bruit, no JVD, and supple, symmetrical, trachea midline Lungs: clear to auscultation bilaterally Heart: regular rate and rhythm Abdomen: soft, non-tender; bowel sounds normal; no masses,  no organomegaly Extremities: extremities normal, atraumatic, no cyanosis or edema Pulses: 2+ and symmetric Skin: Skin color, texture, turgor normal. No rashes or lesions Neurologic: Grossly normal Psych: Pleasant  Inpatient Medications    Scheduled Meds:  apixaban  5 mg Oral BID   clopidogrel  75 mg Oral Daily   insulin aspart  0-15 Units Subcutaneous TID WC   insulin aspart  6 Units Subcutaneous TID WC   insulin glargine  40 Units Subcutaneous QHS   isosorbide-hydrALAZINE  1 tablet Oral TID   labetalol  10 mg  Intravenous Once   metoprolol tartrate  25 mg Oral BID   pravastatin  80 mg Oral QHS   predniSONE  40 mg Oral Q breakfast   sodium chloride flush  3 mL Intravenous Q12H    Continuous Infusions:  sodium chloride      PRN Meds: sodium chloride, acetaminophen, ipratropium-albuterol, meclizine, ondansetron (ZOFRAN) IV, sodium chloride flush   Labs   Results for orders placed or performed during the hospital encounter of 02/13/21 (from the past 48 hour(s))  CBG monitoring, ED     Status: Abnormal   Collection Time: 02/14/21 11:54 AM  Result Value Ref Range   Glucose-Capillary 149 (H) 70 - 99 mg/dL    Comment: Glucose reference range applies only to samples taken after fasting for at least 8 hours.   Comment 1 Notify RN    Comment 2 Document in Chart   CBG monitoring, ED     Status: Abnormal   Collection Time: 02/14/21 12:20 PM  Result Value Ref Range   Glucose-Capillary 133 (H) 70 - 99 mg/dL    Comment: Glucose reference range applies only to samples taken after fasting for at least 8 hours.  Glucose, capillary     Status: Abnormal   Collection Time: 02/14/21  4:26 PM  Result Value Ref Range   Glucose-Capillary 111 (H) 70 - 99 mg/dL  Comment: Glucose reference range applies only to samples taken after fasting for at least 8 hours.  Basic metabolic panel     Status: Abnormal   Collection Time: 02/15/21  3:36 AM  Result Value Ref Range   Sodium 139 135 - 145 mmol/L   Potassium 4.7 3.5 - 5.1 mmol/L    Comment: NON HEMOLYTIC   Chloride 100 98 - 111 mmol/L   CO2 32 22 - 32 mmol/L   Glucose, Bld 197 (H) 70 - 99 mg/dL    Comment: Glucose reference range applies only to samples taken after fasting for at least 8 hours.   BUN 26 (H) 8 - 23 mg/dL   Creatinine, Ser 9.47 (H) 0.61 - 1.24 mg/dL   Calcium 8.6 (L) 8.9 - 10.3 mg/dL   GFR, Estimated 29 (L) >60 mL/min    Comment: (NOTE) Calculated using the CKD-EPI Creatinine Equation (2021)    Anion gap 7 5 - 15    Comment: Performed  at [facility], [address], [place], [place] [address]  CBC with Differential/Platelet     Status: Abnormal   Collection Time: 02/15/21  3:36 AM  Result Value Ref Range   WBC 11.1 (H) 4.0 - 10.5 K/uL   RBC 4.68 4.22 - 5.81 MIL/uL   Hemoglobin 13.0 13.0 - 17.0 g/dL   HCT 36.6 29.4 - 76.5 %   MCV 87.0 80.0 - 100.0 fL   MCH 27.8 26.0 - 34.0 pg   MCHC 31.9 30.0 - 36.0 g/dL   RDW 46.5 03.5 - 46.5 %   Platelets 169 150 - 400 K/uL   nRBC 0.0 0.0 - 0.2 %   Neutrophils Relative % 76 %   Neutro Abs 8.4 (H) 1.7 - 7.7 K/uL   Lymphocytes Relative 14 %   Lymphs Abs 1.5 0.7 - 4.0 K/uL   Monocytes Relative 10 %   Monocytes Absolute 1.1 (H) 0.1 - 1.0 K/uL   Eosinophils Relative 0 %   Eosinophils Absolute 0.0 0.0 - 0.5 K/uL   Basophils Relative 0 %   Basophils Absolute 0.0 0.0 - 0.1 K/uL   Immature Granulocytes 0 %   Abs Immature Granulocytes 0.03 0.00 - 0.07 K/uL    Comment: Performed at [facility], [address] [place], [place] [address]  Hemoglobin A1c     Status: Abnormal   Collection Time: 02/15/21  3:36 AM  Result Value Ref Range   Hgb A1c MFr Bld 6.8 (H) 4.8 - 5.6 %    Comment: (NOTE) Pre diabetes:          5.7%-6.4%  Diabetes:              >6.4%  Glycemic control for   <7.0% adults with diabetes    Mean Plasma Glucose 148.46 mg/dL    Comment: Performed at [facility], [address], [place], [place] [address]  TSH     Status: None   Collection Time: 02/15/21  3:36 AM  Result Value Ref Range   TSH 1.933 0.350 - 4.500 uIU/mL    Comment: Performed by a 3rd Generation assay with a functional sensitivity of <=0.01 uIU/mL. Performed at [facility], [address], [place], [place] [address]   Glucose, capillary     Status: Abnormal   Collection Time: 02/15/21  7:58 AM  Result Value Ref Range   Glucose-Capillary 111 (H) 70 - 99 mg/dL    Comment: Glucose reference range applies only to samples  taken after fasting for at least 8 hours.  Glucose,  capillary     Status: Abnormal   Collection Time: 02/15/21 12:08 PM  Result Value Ref Range   Glucose-Capillary 125 (H) 70 - 99 mg/dL    Comment: Glucose reference range applies only to samples taken after fasting for at least 8 hours.  Glucose, capillary     Status: Abnormal   Collection Time: 02/15/21  5:26 PM  Result Value Ref Range   Glucose-Capillary 288 (H) 70 - 99 mg/dL    Comment: Glucose reference range applies only to samples taken after fasting for at least 8 hours.  Glucose, capillary     Status: Abnormal   Collection Time: 02/15/21  8:13 PM  Result Value Ref Range   Glucose-Capillary 271 (H) 70 - 99 mg/dL    Comment: Glucose reference range applies only to samples taken after fasting for at least 8 hours.  Basic metabolic panel     Status: Abnormal   Collection Time: 02/16/21  1:26 AM  Result Value Ref Range   Sodium 136 135 - 145 mmol/L   Potassium 4.5 3.5 - 5.1 mmol/L   Chloride 97 (L) 98 - 111 mmol/L   CO2 31 22 - 32 mmol/L   Glucose, Bld 219 (H) 70 - 99 mg/dL    Comment: Glucose reference range applies only to samples taken after fasting for at least 8 hours.   BUN 36 (H) 8 - 23 mg/dL   Creatinine, Ser 9.14 (H) 0.61 - 1.24 mg/dL   Calcium 8.6 (L) 8.9 - 10.3 mg/dL   GFR, Estimated 29 (L) >60 mL/min    Comment: (NOTE) Calculated using the CKD-EPI Creatinine Equation (2021)    Anion gap 8 5 - 15    Comment: Performed at [facility], [address], [place], [place] [address]  Glucose, capillary     Status: Abnormal   Collection Time: 02/16/21  7:30 AM  Result Value Ref Range   Glucose-Capillary 152 (H) 70 - 99 mg/dL    Comment: Glucose reference range applies only to samples taken after fasting for at least 8 hours.    ECG   N/A  Telemetry   Sinus rhythm- Personally Reviewed  Radiology    ECHOCARDIOGRAM COMPLETE  Result Date: 02/14/2021    ECHOCARDIOGRAM REPORT   Patient Name:   AQUILLA VOILES Date of Exam: 02/14/2021 Medical Rec #:  [id number]     Height:       71.0 in Accession #:    [id number]   Weight:       220.0 lb Date of Birth:  1945-12-14    BSA:          2.196 m Patient Age:    75 years     BP:           171/90 mmHg Patient Gender: M            HR:           84 bpm. Exam Location:  Inpatient Procedure: 2D Echo, Cardiac Doppler and Color Doppler Indications:    I50.23 Acute on chronic systolic (congestive) heart failure  History:        Patient has prior history of Echocardiogram examinations, most                 recent 06/18/2019. CHF, Arrythmias:Atrial Fibrillation and                 Atrial Flutter; Risk Factors:Hypertension, Diabetes,  Dyslipidemia and Sleep Apnea.  Sonographer:    Elmarie Shiley Dance Referring Phys: [id number] JARED M GARDNER IMPRESSIONS  1. Left ventricular ejection fraction, by estimation, is 45 to 50%. The left ventricle has mildly decreased function. The left ventricle demonstrates regional wall motion abnormalities.     The basal-to-mid anterior and basal-to-mid anterolateral walls appear hypokinetic based on limited views. There is moderate concentric left ventricular hypertrophy. Left ventricular diastolic parameters are consistent with Grade III diastolic dysfunction (restrictive).  2. Right ventricular systolic function is normal. The right ventricular size is normal. There is normal pulmonary artery systolic pressure. The estimated right ventricular systolic pressure is 32.6 mmHg.  3. Left atrial size was mildly dilated.  4. The mitral valve is normal in structure. Trivial mitral valve regurgitation.  5. The aortic valve is tricuspid. There is mild calcification of the aortic valve. There is mild thickening of the aortic valve. Aortic valve regurgitation is not visualized. Mild to moderate aortic valve sclerosis/calcification is present, without any evidence of aortic stenosis.  6. The inferior vena cava is dilated in size with >50% respiratory variability, suggesting right atrial pressure of 8 mmHg. Comparison(s):  Compared to prior TTE on 06/2019, the LVEF appears mildly reduced 45-50% with wall motion abnormalities as detailed above. There is also G3DD currently. FINDINGS  Left Ventricle: Left ventricular ejection fraction, by estimation, is 45 to 50%. The left ventricle has mildly decreased function. The left ventricle demonstrates regional wall motion abnormalities. The basal-to-mid anterior and basal-to-mid anterolateral walls appear hypokinetic based on limited views. The left ventricular internal cavity size was normal in size. There is moderate concentric left ventricular hypertrophy. Left ventricular diastolic parameters are consistent with Grade III diastolic dysfunction (restrictive). Right Ventricle: The right ventricular size is normal. No increase in right ventricular wall thickness. Right ventricular systolic function is normal. There is normal pulmonary artery systolic pressure. The tricuspid regurgitant velocity is 2.48 m/s, and  with an assumed right atrial pressure of 8 mmHg, the estimated right ventricular systolic pressure is 32.6 mmHg. Left Atrium: Left atrial size was mildly dilated. Right Atrium: Right atrial size was normal in size. Pericardium: There is no evidence of pericardial effusion. Mitral Valve: The mitral valve is normal in structure. There is mild thickening of the mitral valve leaflet(s). There is mild calcification of the mitral valve leaflet(s). Mild mitral annular calcification. Trivial mitral valve regurgitation. Tricuspid Valve: The tricuspid valve is normal in structure. Tricuspid valve regurgitation is trivial. Aortic Valve: The aortic valve is tricuspid. There is mild calcification of the aortic valve. There is mild thickening of the aortic valve. Aortic valve regurgitation is not visualized. Mild to moderate aortic valve sclerosis/calcification is present, without any evidence of aortic stenosis. Pulmonic Valve: The pulmonic valve was normal in structure. Pulmonic valve  regurgitation is not visualized. Aorta: The aortic root and ascending aorta are structurally normal, with no evidence of dilitation. Venous: The inferior vena cava is dilated in size with greater than 50% respiratory variability, suggesting right atrial pressure of 8 mmHg. IAS/Shunts: No atrial level shunt detected by color flow Doppler.  LEFT VENTRICLE PLAX 2D LVIDd:         4.30 cm Diastology LVIDs:         2.80 cm LV e' medial:    5.98 cm/s LV PW:         1.40 cm LV E/e' medial:  19.7 LV IVS:        1.30 cm LV e' lateral:   8.16 cm/s  LV E/e' lateral: 14.5  RIGHT VENTRICLE             IVC RV Basal diam:  2.70 cm     IVC diam: 2.10 cm RV S prime:     12.20 cm/s TAPSE (M-mode): 2.2 cm LEFT ATRIUM              Index       RIGHT ATRIUM           Index LA diam:        3.80 cm  1.73 cm/m  RA Area:     13.80 cm LA Vol (A2C):   104.0 ml 47.36 ml/m RA Volume:   33.80 ml  15.39 ml/m LA Vol (A4C):   59.6 ml  27.14 ml/m LA Biplane Vol: 82.5 ml  37.57 ml/m   AORTA Ao Asc diam: 3.40 cm MITRAL VALVE                TRICUSPID VALVE MV Area (PHT): 3.60 cm     TR Peak grad:   24.6 mmHg MV Decel Time: 211 msec     TR Vmax:        248.00 cm/s MV E velocity: 118.00 cm/s MV A velocity: 56.90 cm/s MV E/A ratio:  2.07 Laurance FlattenHeather Pemberton MD Electronically signed by Laurance FlattenHeather Pemberton MD Signature Date/Time: 02/14/2021/2:54:50 PM    Final     Cardiac Studies   See echo above  Assessment   Principal Problem:   Acute pulmonary edema (HCC) Active Problems:   Diabetes mellitus type 2 in nonobese (HCC)   Essential hypertension   HLD (hyperlipidemia)   Narrow complex tachycardia (HCC)   CKD (chronic kidney disease) stage 3, GFR 30-59 ml/min (HCC)   New onset of congestive heart failure (HCC)   Diastolic CHF, acute (HCC)   A-fib (HCC)   Atrial flutter (HCC)   Hypoxia   OSA (obstructive sleep apnea)   Acute respiratory failure with hypoxia [facility](HCC)   Plan   Mr. Manson PasseyBrown c/o some dyspnea today -   creatinine has stabilized off diuretic. Will likely need maintenance diuretic after discharge - maybe 60-80 mg daily based on GFR.  Give additional 60 mg IV x 1 this am. Will add hydralazine and imdur for BP / CHF (Bidil is not cost effective and not covered by medicare, so after discharge it will be cost-prohibitive to fill). Probably could be discharged later today. Happy to see patient in cardiology follow-up.  Time Spent Directly with Patient:  I have spent a total of 25 minutes with the patient reviewing hospital notes, telemetry, EKGs, labs and examining the patient as well as establishing an assessment and plan that was discussed personally with the patient.  > 50% of time was spent in direct patient care.  Length of Stay:  LOS: 2 days   Chrystie NoseKenneth C. Akshath Mccarey, MD, Pr[facility] Health [facility] ParkridgeFACC, FACP  [place]  [facility]  Medical Director of the [facility] Lipid [facility]  Cardiovascular Risk Reduction Clinic Diplomate of the American Board of Clinical Lipidology Attending Cardiologist  Direct Dial: [phone]  Fax: [phone]  Website:  [email or web address]  Caulder Wehner C Naphtali Riede 02/16/2021, 8:11 AM

## 2021-02-16 NOTE — Progress Notes (Signed)
Inpatient Diabetes Program Recommendations  AACE/ADA: New Consensus Statement on Inpatient Glycemic Control (2015)  Target Ranges:  Prepandial:   less than 140 mg/dL      Peak postprandial:   less than 180 mg/dL (1-2 hours)      Critically ill patients:  140 - 180 mg/dL   Lab Results  Component Value Date   GLUCAP 99 02/16/2021   HGBA1C 6.8 (H) 02/15/2021    Review of Glycemic Control  Diabetes history: DM2 Outpatient Diabetes medications: Lantus 50 units, Novolog 13-15 units tid meal coverage Current orders for Inpatient glycemic control: Lantus 40 units,Novolog 6 units tid, Novolog correction  0-15 units tid, Prednisone 40 mg q am  Inpatient Diabetes Program Recommendations:   -Add Novolog 0-5 units correction  Noted CBG 152 @ 07:30 am and Novolog correction given @ 10:09.  Thank you, Billy Fischer. Janelle Spellman, RN, MSN, CDE  Diabetes Coordinator Inpatient Glycemic Control Team Team Pager [phone] (8am-5pm) 02/16/2021 12:35 PM

## 2021-02-16 NOTE — TOC Benefit Eligibility Note (Signed)
Patient Teacher, English as a foreign language completed.    The patient is currently admitted and upon discharge could be taking Eliquis 5 mg.  The current 30 day co-pay is, $142.00 due to a $95.00 deductible.  After deductible met should be $47.00 a month.   The patient is insured through [employer], [place] Patient Advocate Specialist [place] Team Direct Number: [phone]  Fax: [phone]

## 2021-02-17 ENCOUNTER — Other Ambulatory Visit (HOSPITAL_COMMUNITY): Payer: Self-pay

## 2021-02-17 LAB — BASIC METABOLIC PANEL
Anion gap: 8 (ref 5–15)
BUN: 44 mg/dL — ABNORMAL HIGH (ref 8–23)
CO2: 30 mmol/L (ref 22–32)
Calcium: 8.4 mg/dL — ABNORMAL LOW (ref 8.9–10.3)
Chloride: 97 mmol/L — ABNORMAL LOW (ref 98–111)
Creatinine, Ser: 2.51 mg/dL — ABNORMAL HIGH (ref 0.61–1.24)
GFR, Estimated: 26 mL/min — ABNORMAL LOW (ref >60)
Glucose, Bld: 246 mg/dL — ABNORMAL HIGH (ref 70–99)
Potassium: 4.2 mmol/L (ref 3.5–5.1)
Sodium: 135 mmol/L (ref 135–145)

## 2021-02-17 LAB — GLUCOSE, CAPILLARY: Glucose-Capillary: 155 mg/dL — ABNORMAL HIGH (ref 70–99)

## 2021-02-17 MED ORDER — APIXABAN 5 MG PO TABS
5.0000 mg | ORAL_TABLET | Freq: Two times a day (BID) | ORAL | 0 refills | Status: DC
  Filled 2021-02-17: qty 60, 30d supply, fill #0

## 2021-02-17 MED ORDER — METOPROLOL TARTRATE 25 MG PO TABS
25.0000 mg | ORAL_TABLET | Freq: Two times a day (BID) | ORAL | 0 refills | Status: DC
  Filled 2021-02-17: qty 60, 30d supply, fill #0

## 2021-02-17 MED ORDER — HYDRALAZINE HCL 25 MG PO TABS
25.0000 mg | ORAL_TABLET | Freq: Three times a day (TID) | ORAL | 0 refills | Status: DC
  Filled 2021-02-17: qty 90, 30d supply, fill #0

## 2021-02-17 MED ORDER — ALBUTEROL SULFATE HFA 108 (90 BASE) MCG/ACT IN AERS
2.0000 | INHALATION_SPRAY | Freq: Four times a day (QID) | RESPIRATORY_TRACT | 1 refills | Status: DC | PRN
  Filled 2021-02-17: qty 8.5, 25d supply, fill #0

## 2021-02-17 MED ORDER — ISOSORBIDE MONONITRATE ER 30 MG PO TB24
30.0000 mg | ORAL_TABLET | Freq: Every day | ORAL | 0 refills | Status: DC
  Filled 2021-02-17: qty 30, 30d supply, fill #0

## 2021-02-17 NOTE — Plan of Care (Signed)

## 2021-02-17 NOTE — Progress Notes (Signed)
Patient d/c'd home, dc instructions provided with patient and spouse.  Teach back provided.  IV removed with tip intact.  Patient wheeled down via wc.  Caleb Cuevas

## 2021-02-17 NOTE — Progress Notes (Signed)
Physical Therapy Treatment Patient Details Name: Caleb Cuevas MRN: [id number] DOB: Dec 08, 1945 Today's Date: 02/17/2021    History of Present Illness 75 y.o. male presenting to ED 7/10 with progressively worsening BLE edema, SOB, DOE and orthopnea x several weeks to months. Patient admitted with flash pulmonary edema and new onset CHF. PMHx significant for DMII, HTN, HLD, PAD, Hx of TIA and cerebral aneurysm.    PT Comments    Patient self-selects slower velocity when walking and demonstrates several stagger steps (to rt or lt) with independent recovery. When cued to incr velocity, his balance improved, however he does not maintain this velocity unless cued. Discussed use of cane and pt reports he has one at home and plans to use when he gets home. States he feels weaker due to lack of ambulation and expects he will improve by going home. Denies need for further PT.     Follow Up Recommendations  No PT follow up (pt denies need; reports he has cane that he'll use)     Equipment Recommendations  None recommended by PT    Recommendations for Other Services       Precautions / Restrictions Precautions Precautions: Fall    Mobility  Bed Mobility Overal bed mobility: Modified Independent             General bed mobility comments: HOB elevated    Transfers Overall transfer level: Modified independent Equipment used: None Transfers: Sit to/from Stand Sit to Stand: Modified independent (Device/Increase time)            Ambulation/Gait Ambulation/Gait assistance: Min guard Gait Distance (Feet): 300 Feet Assistive device: None Gait Pattern/deviations: Step-through pattern;Decreased stride length;Wide base of support;Staggering left;Staggering right Gait velocity: decr but his baseline per pt; was able to incr velocity with cues and balance improved however pt did not maintain this velocity and needed cues to incr again   General Gait Details: pt with staggering LOB x 3  and now admits that he sometimes staggers, but this is worse because he has not been walking much   Optometrist    Modified Rankin (Stroke Patients Only)       Balance Overall balance assessment: Mild deficits observed, not formally tested                                          Cognition Arousal/Alertness: Awake/alert Behavior During Therapy: WFL for tasks assessed/performed Overall Cognitive Status: Within Functional Limits for tasks assessed                                        Exercises      General Comments General comments (skin integrity, edema, etc.): VSS on room air      Pertinent Vitals/Pain Pain Assessment: No/denies pain    Home Living                      Prior Function            PT Goals (current goals can now be found in the care plan section) Acute Rehab PT Goals Patient Stated Goal: To return home. Time For Goal Achievement: 03/02/21 Potential to Achieve Goals: Good Progress towards PT goals: Not progressing toward  goals - comment (more unsteady this date)    Frequency    Min 3X/week      PT Plan Current plan remains appropriate    Co-evaluation              AM-PAC PT "6 Clicks" Mobility   Outcome Measure  Help needed turning from your back to your side while in a flat bed without using bedrails?: None Help needed moving from lying on your back to sitting on the side of a flat bed without using bedrails?: None Help needed moving to and from a bed to a chair (including a wheelchair)?: None Help needed standing up from a chair using your arms (e.g., wheelchair or bedside chair)?: None Help needed to walk in hospital room?: A Little Help needed climbing 3-5 steps with a railing? : A Little 6 Click Score: 22    End of Session Equipment Utilized During Treatment: Gait belt Activity Tolerance: Patient tolerated treatment well Patient left: in  chair;with call bell/phone within reach   PT Visit Diagnosis: Unsteadiness on feet (R26.81)     Time: 8921-1941 PT Time Calculation (min) (ACUTE ONLY): 12 min  Charges:  $Gait Training: 8-22 mins                      Jerolyn Center, PT Pager [phone]    [name] 02/17/2021, 10:08 AM

## 2021-02-17 NOTE — Progress Notes (Signed)
DAILY PROGRESS NOTE   Patient Name: Caleb Cuevas Date of Encounter: 02/17/2021 Cardiologist: None  Chief Complaint   No complaints  Patient Profile   Caleb Cuevas is a 75 y.o. 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  Subjective   Net negative only 300 cc with lasix - creatinine is worse at 2.5 today. Maintaining sinus.   Objective   Vitals:   02/16/21 2044 02/17/21 0032 02/17/21 0404 02/17/21 0800  BP: (!) 115/54 (!) 171/65 (!) 196/81 (!) 153/87  Pulse: 68 69 66 71  Resp: (!) 24 18 12 20   Temp: 98.6 F (37 C) 98.5 F (36.9 C) 98.1 F (36.7 C) 97.8 F (36.6 C)  TempSrc: Oral Oral Oral Oral  SpO2: 92% 98% 100% 100%  Weight:      Height:        Intake/Output Summary (Last 24 hours) at 02/17/2021 0834 Last data filed at 02/17/2021 0532 Gross per 24 hour  Intake --  Output 300 ml  Net -300 ml   Filed Weights   02/13/21 1630 02/15/21 0402 02/16/21 0500  Weight: 99.8 kg 99.7 kg 97.9 kg    Physical Exam   General appearance: alert and no distress Neck: no carotid bruit, no JVD, and supple, symmetrical, trachea midline Lungs: diminished breath sounds RLL Heart: regular rate and rhythm Abdomen: soft, non-tender; bowel sounds normal; no masses,  no organomegaly Extremities: extremities normal, atraumatic, no cyanosis or edema Pulses: 2+ and symmetric Skin: Skin color, texture, turgor normal. No rashes or lesions Neurologic: Grossly normal Psych: Pleasant  Inpatient Medications    Scheduled Meds:  apixaban  5 mg Oral BID   clopidogrel  75 mg Oral Daily   hydrALAZINE  25 mg Oral Q8H   insulin aspart  0-6 Units Subcutaneous TID WC   insulin aspart  6 Units Subcutaneous TID WC   insulin glargine  40 Units Subcutaneous QHS   isosorbide mononitrate  30 mg Oral Daily    labetalol  10 mg Intravenous Once   metoprolol tartrate  25 mg Oral BID   pravastatin  80 mg Oral QHS   predniSONE  40 mg Oral Q breakfast   sodium chloride flush  3 mL Intravenous Q12H    Continuous Infusions:  sodium chloride      PRN Meds: sodium chloride, acetaminophen, ipratropium-albuterol, meclizine, ondansetron (ZOFRAN) IV, sodium chloride flush   Labs   Results for orders placed or performed during the hospital encounter of 02/13/21 (from the past 48 hour(s))  Glucose, capillary     Status: Abnormal   Collection Time: 02/15/21 12:08 PM  Result Value Ref Range   Glucose-Capillary 125 (H) 70 - 99 mg/dL    Comment: Glucose reference range applies only to samples taken after fasting for at least 8 hours.  Glucose, capillary     Status: Abnormal   Collection Time: 02/15/21  5:26 PM  Result Value Ref Range   Glucose-Capillary 288 (H) 70 - 99 mg/dL    Comment: Glucose reference range applies only to samples taken after fasting for at least 8 hours.  Glucose, capillary     Status: Abnormal   Collection Time: 02/15/21  8:13 PM  Result Value Ref Range   Glucose-Capillary 271 (H) 70 - 99 mg/dL    Comment: Glucose reference range applies only to samples taken after  fasting for at least 8 hours.  Basic metabolic panel     Status: Abnormal   Collection Time: 02/16/21  1:26 AM  Result Value Ref Range   Sodium 136 135 - 145 mmol/L   Potassium 4.5 3.5 - 5.1 mmol/L   Chloride 97 (L) 98 - 111 mmol/L   CO2 31 22 - 32 mmol/L   Glucose, Bld 219 (H) 70 - 99 mg/dL    Comment: Glucose reference range applies only to samples taken after fasting for at least 8 hours.   BUN 36 (H) 8 - 23 mg/dL   Creatinine, Ser 6.81 (H) 0.61 - 1.24 mg/dL   Calcium 8.6 (L) 8.9 - 10.3 mg/dL   GFR, Estimated 29 (L) >60 mL/min    Comment: (NOTE) Calculated using the CKD-EPI Creatinine Equation (2021)    Anion gap 8 5 - 15    Comment: Performed at [facility], [address], [place], [place]  [address]  Glucose, capillary     Status: Abnormal   Collection Time: 02/16/21  7:30 AM  Result Value Ref Range   Glucose-Capillary 152 (H) 70 - 99 mg/dL    Comment: Glucose reference range applies only to samples taken after fasting for at least 8 hours.  Glucose, capillary     Status: None   Collection Time: 02/16/21 11:36 AM  Result Value Ref Range   Glucose-Capillary 99 70 - 99 mg/dL    Comment: Glucose reference range applies only to samples taken after fasting for at least 8 hours.  Glucose, capillary     Status: Abnormal   Collection Time: 02/16/21  3:51 PM  Result Value Ref Range   Glucose-Capillary 109 (H) 70 - 99 mg/dL    Comment: Glucose reference range applies only to samples taken after fasting for at least 8 hours.  Glucose, capillary     Status: Abnormal   Collection Time: 02/16/21  8:43 PM  Result Value Ref Range   Glucose-Capillary 305 (H) 70 - 99 mg/dL    Comment: Glucose reference range applies only to samples taken after fasting for at least 8 hours.  Basic metabolic panel     Status: Abnormal   Collection Time: 02/17/21  1:55 AM  Result Value Ref Range   Sodium 135 135 - 145 mmol/L   Potassium 4.2 3.5 - 5.1 mmol/L   Chloride 97 (L) 98 - 111 mmol/L   CO2 30 22 - 32 mmol/L   Glucose, Bld 246 (H) 70 - 99 mg/dL    Comment: Glucose reference range applies only to samples taken after fasting for at least 8 hours.   BUN 44 (H) 8 - 23 mg/dL   Creatinine, Ser 0.01 (H) 0.61 - 1.24 mg/dL   Calcium 8.4 (L) 8.9 - 10.3 mg/dL   GFR, Estimated 26 (L) >60 mL/min    Comment: (NOTE) Calculated using the CKD-EPI Creatinine Equation (2021)    Anion gap 8 5 - 15    Comment: Performed at [facility], [address], [place], [place] [address]  Glucose, capillary     Status: Abnormal   Collection Time: 02/17/21  7:57 AM  Result Value Ref Range   Glucose-Capillary 155 (H) 70 - 99 mg/dL    Comment: Glucose reference range applies only to samples taken after fasting for at  least 8 hours.    ECG   N/A  Telemetry   Sinus rhythm- Personally Reviewed  Radiology    No results found.  Cardiac Studies  See echo above  Assessment   Principal Problem:   Acute pulmonary edema (HCC) Active Problems:   Diabetes mellitus type 2 in nonobese (HCC)   Essential hypertension   HLD (hyperlipidemia)   Narrow complex tachycardia (HCC)   CKD (chronic kidney disease) stage 3, GFR 30-59 ml/min (HCC)   New onset of congestive heart failure (HCC)   Diastolic CHF, acute (HCC)   A-fib (HCC)   Atrial flutter (HCC)   Hypoxia   OSA (obstructive sleep apnea)   Acute respiratory failure with hypoxia [facility])   Plan   Caleb Cuevas has had improvement in his breathing - on [place] 2L oxygen at this time, but spent "all day" yesterday off of it. Hopefully can d/c without oxygen. Agree with holding off on lasix after discharge and repeating BMET early next week. Ok for d/c home today- Follow-up has been arranged on 7/26 with Gillian Shields in our office.  Time Spent Directly with Patient:  I have spent a total of 25 minutes with the patient reviewing hospital notes, telemetry, EKGs, labs and examining the patient as well as establishing an assessment and plan that was discussed personally with the patient.  > 50% of time was spent in direct patient care.  Length of Stay:  LOS: 3 days   Chrystie Nose, MD, [facility], FACP  [facility]  Medical Director of the [facility]  [facility] Diplomate of the American Board of Clinical Lipidology Attending Cardiologist  Direct Dial: [phone]  Fax: [phone]  Website:  [email or web address] 02/17/2021, 8:34 AM

## 2021-02-17 NOTE — Discharge Summary (Signed)
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  Also reported bilateral lower extremity edema .  He experienced severe shortness of breath so he called EMS and he was brought to the emergency department.  On presentation his heart rate was in the range of 200s and EKG showed narrow complex tachycardia.  He was cardioverted by EMS and also given Cardizem 10 mg and then he converted to normal sinus rhythm.  CTA chest showed pulmonary edema.  Patient was admitted for the management of acute congestive heart failure.  Cardiology also following.  He was diuresed well following resolution of acute hypoxic respiratory failure.  Currently he is tolerating room air.  He will be discharged today to home, cardiology appointment has already been set up.  Following problems were addressed during his hospitalization:  Acute systolic/diastolic combined congestive heart failure: No history of heart failure in the past.  Reported orthopnea, dyspnea on exertion, bilateral lower extremity edema.  Elevated BNP on presentation.  Echo showed EF of 45 to 50%, grade 3 diastolic dysfunction. He was aggressively diuresed.  Lasix will be held on discharge because of worsening kidney function.   Acute hypoxic respiratory failure: He was apneic on arrival and required bag valve mask.  Presentation, required 3 to 4 L of oxygen per  minute.  Does not use oxygen at home.  Most likely secondary to acute congestive heart failure and also possible COPD exacerbation.  Chest imaging showed pulmonary edema.  Currently he is on room air.  COPD exacerbation: No documented history of COPD but he was wheezing on auscultation.  And he is a past smoker.  Treated with prednisone, bronchodilators.  Prescribed albuterol inhaler on discharge.  We recommend follow-up with pulmonology as an outpatient.   Narrow complex tachycardia/Afib: Heart rate was in the range of 200s on presentation.  He was cardioverted by EMS and was also given a dose of Cardizem 10 mg IV then he converted to normal sinus rhythm.  Initial strip showed A. fib/atrial flutter.     Continue metoprolol 25 mg twice daily.  He also has been started on Eliquis   Hypertension:   We will hold lisinopril due to AKI, continue metoprolol, started on hydralazine and imdur   Diabetes type 2: Takes insulin at home.  Continue   Hyperlipidemia: Continue statin   AKI on CKD stage IIIb:   Baseline creatinine ranges from 1.5-1.8.  Creatinine bumped up to more than 2. F/U with PCP in a week and do a BMP just to check the kidney function.  Lasix will be held   H/O PAD/TIA:History of minimal nonocclusive coronary artery disease on left heart catheterization in 2019.  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   Debility/deconditioning: We  requested PT/OT evaluation ,no follow up recommended   Discharge Diagnoses:  Principal Problem:   Acute pulmonary edema (HCC) Active Problems:   Diabetes mellitus type  2 in nonobese [facility])   Essential hypertension   HLD (hyperlipidemia)   Narrow complex tachycardia (HCC)   CKD (chronic kidney disease) stage 3, GFR 30-59 ml/min (HCC)   New onset of congestive heart failure (HCC)   Diastolic CHF, acute (HCC)   A-fib (HCC)   Atrial flutter (HCC)   Hypoxia    OSA (obstructive sleep apnea)   Acute respiratory failure with hypoxia [facility])    Discharge Instructions  Discharge Instructions     Diet - low sodium heart healthy   Complete by: As directed    Discharge instructions   Complete by: As directed    1)Please take prescribed medications as instructed 2)Follow up with your PCP in a week.  Do a BMP test during the follow-up to check your kidney function. 3) Follow-up with cardiology on 7/26.  Appointment has been scheduled.   Increase activity slowly   Complete by: As directed       Allergies as of 02/17/2021   No Known Allergies      Medication List     STOP taking these medications    lisinopril 40 MG tablet Commonly known as: ZESTRIL       TAKE these medications    apixaban 5 MG Tabs tablet Commonly known as: ELIQUIS Take 1 tablet (5 mg total) by mouth 2 (two) times daily.   cetirizine 10 MG tablet Commonly known as: ZYRTEC Take 10 mg by mouth daily as needed for allergies.   clopidogrel 75 MG tablet Commonly known as: PLAVIX Take 1 tablet (75 mg total) by mouth daily.   hydrALAZINE 25 MG tablet Commonly known as: APRESOLINE Take 1 tablet (25 mg total) by mouth every 8 (eight) hours.   ibuprofen 200 MG tablet Commonly known as: ADVIL Take 600 mg by mouth every 6 (six) hours as needed for headache or moderate pain.   insulin aspart 100 UNIT/ML FlexPen Commonly known as: NOVOLOG Inject 13-15 Units into the skin See admin instructions. 13 AM -13 Lunch -15 Dinner   Insulin Glargine-yfgn 100 UNIT/ML Sopn Inject 50 Units into the skin at bedtime.   Insulin Pen Needle 32G X 4 MM Misc Commonly known as: BD Pen Needle Nano U/F USE AS DIRECTED TO INJECT VICTOZA SQ QD What changed: additional instructions   isosorbide mononitrate 30 MG 24 hr tablet Commonly known as: IMDUR Take 1 tablet (30 mg total) by mouth daily. Start taking on: February 18, 2021   meclizine 25 MG tablet Commonly known as: ANTIVERT Chew  25 mg by mouth 2 (two) times daily as needed (vertigo).   metoprolol tartrate 25 MG tablet Commonly known as: LOPRESSOR Take 1 tablet (25 mg total) by mouth 2 (two) times daily.   MULTI-VITAMIN/MINERALS PO Take 1 tablet by mouth daily.   pravastatin 80 MG tablet Commonly known as: PRAVACHOL Take 80 mg by mouth at bedtime.        Follow-up Information     [facility] Follow up.   Specialty: Cardiology Why: A follow-up has been arranged for you with our cardiology team [facility]) at the [facility] location on Tuesday Mar 01, 2021 at 8:30 AM (Arrive by 8:15 AM). Contact information: [address] [place] [address] [place] [address] [phone]        Donita Brooks, MD. Schedule an appointment as soon as possible for a visit in 1 week(s).   Specialty: Family Medicine Contact information: [address] [place] [address] [phone]  No Known Allergies  Consultations: Cardioogy   Procedures/Studies: CT Head Wo Contrast  Result Date: 02/13/2021 CLINICAL DATA:  Altered mental status EXAM: CT HEAD WITHOUT CONTRAST TECHNIQUE: Contiguous axial images were obtained from the base of the skull through the vertex without intravenous contrast. COMPARISON:  06/18/2019 FINDINGS: Brain: No evidence of acute infarction, hemorrhage, hydrocephalus, extra-axial collection or mass lesion/mass effect. Mild atrophic and chronic white matter ischemic changes are noted. 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 Vascular: No hyperdense vessel or unexpected calcification. Skull: Normal. Negative for fracture or focal lesion. Sinuses/Orbits: No acute finding. Other: None. IMPRESSION: Chronic atrophic and ischemic changes without acute abnormality. Electronically Signed   By: Alcide Clever M.D.   On: 02/13/2021 20:31   CT Angio Chest PE W/Cm &/Or Wo  Cm  Result Date: 02/13/2021 CLINICAL DATA:  Shortness of breath EXAM: CT ANGIOGRAPHY CHEST WITH CONTRAST TECHNIQUE: Multidetector CT imaging of the chest was performed using the standard protocol during bolus administration of intravenous contrast. Multiplanar CT image reconstructions and MIPs were obtained to evaluate the vascular anatomy. CONTRAST:  16mL OMNIPAQUE IOHEXOL 350 MG/ML SOLN COMPARISON:  Chest radiograph Dec 24, 2017 FINDINGS: Cardiovascular: Satisfactory opacification of the pulmonary arteries to the segmental level. No evidence of pulmonary embolism. Gas in the pulmonary outflow tract related to vascular access. Aortic atherosclerosis without aneurysmal dilation. Mild cardiac enlargement. No pericardial significant effusion/thickening. No suspicious intracardiac filling defect. Mediastinum/Nodes: No discrete thyroid nodule. No pathologically enlarged mediastinal, hilar or axillary lymph nodes. The trachea esophagus are grossly unremarkable. Lungs/Pleura: Layering small bilateral pleural effusions with adjacent airspace consolidations/atelectasis. Mild diffuse interstitial thickening with adjacent ground-glass opacities. Upper Abdomen: Peripherally calcified splenic hypodensity measuring 2.7 cm, incompletely evaluated but favored sequela prior trauma. Thickening of adrenal glands without discrete nodularity, favor hyperplasia. Hypodense 5 mm focus in the upper pole the right kidney which may represent a proteinaceous/hemorrhagic cyst but is incompletely evaluated on this study. Musculoskeletal: Multilevel degenerative changes spine. Degenerative changes of the bilateral shoulders. No acute osseous. Review of the MIP images confirms the above findings. IMPRESSION: 1. No evidence of pulmonary embolism. 2. Findings suggestive of congestive heart failure with cardiomegaly, pulmonary edema and small layering bilateral pleural effusions with adjacent atelectasis/consolidations. 3. Peripherally calcified  splenic hypodensity measuring 2.7 cm, incompletely evaluated but favored sequela prior trauma. 4. Hyperdense 5 mm focus in the upper pole the right kidney which may represent a proteinaceous/hemorrhagic cyst but is incompletely evaluated on this single-phase study. Consider further evaluation with renal protocol MRI CT with and without contrast in 6 months for further characterization assessment of stability. 5. Aortic Atherosclerosis (ICD10-I70.0). Electronically Signed   By: Maudry Mayhew MD   On: 02/13/2021 20:38   DG Chest Port 1 View  Result Date: 02/13/2021 CLINICAL DATA:  Shortness of breath EXAM: PORTABLE CHEST 1 VIEW COMPARISON:  Dec 24, 2017 FINDINGS: Patient is rotated. The cardiac shadow appears slightly enlarged, at least somewhat accentuated by technique. Central vascular prominence. Mild bilateral interstitial thickening. Slightly reduced lung volumes with hypoventilatory change. The visualized skeletal structures are unremarkable. IMPRESSION: 1. Central vascular prominence and mild bilateral interstitial thickening, possibly representing mild interstitial edema. 2. Slightly reduced lung volumes with hypoventilatory change. Electronically Signed   By: Maudry Mayhew MD   On: 02/13/2021 17:04   ECHOCARDIOGRAM COMPLETE  Result Date: 02/14/2021    ECHOCARDIOGRAM REPORT   Patient Name:   Caleb Cuevas Date of Exam: 02/14/2021 Medical Rec #:  [id number]    Height:  71.0 in Accession #:    [id number]   Weight:       220.0 lb Date of Birth:  [birth year]    BSA:          2.196 m Patient Age:    75 years     BP:           171/90 mmHg Patient Gender: M            HR:           84 bpm. Exam Location:  Inpatient Procedure: 2D Echo, Cardiac Doppler and Color Doppler Indications:    I50.23 Acute on chronic systolic (congestive) heart failure  History:        Patient has prior history of Echocardiogram examinations, most                 recent 06/18/2019. CHF, Arrythmias:Atrial Fibrillation and                  Atrial Flutter; Risk Factors:Hypertension, Diabetes,                 Dyslipidemia and Sleep Apnea.  Sonographer:    Elmarie Shiley Dance Referring Phys: [id number] JARED M GARDNER IMPRESSIONS  1. Left ventricular ejection fraction, by estimation, is 45 to 50%. The left ventricle has mildly decreased function. The left ventricle demonstrates regional wall motion abnormalities.     The basal-to-mid anterior and basal-to-mid anterolateral walls appear hypokinetic based on limited views. There is moderate concentric left ventricular hypertrophy. Left ventricular diastolic parameters are consistent with Grade III diastolic dysfunction (restrictive).  2. Right ventricular systolic function is normal. The right ventricular size is normal. There is normal pulmonary artery systolic pressure. The estimated right ventricular systolic pressure is 32.6 mmHg.  3. Left atrial size was mildly dilated.  4. The mitral valve is normal in structure. Trivial mitral valve regurgitation.  5. The aortic valve is tricuspid. There is mild calcification of the aortic valve. There is mild thickening of the aortic valve. Aortic valve regurgitation is not visualized. Mild to moderate aortic valve sclerosis/calcification is present, without any evidence of aortic stenosis.  6. The inferior vena cava is dilated in size with >50% respiratory variability, suggesting right atrial pressure of 8 mmHg. Comparison(s): Compared to prior TTE on 06/2019, the LVEF appears mildly reduced 45-50% with wall motion abnormalities as detailed above. There is also G3DD currently. FINDINGS  Left Ventricle: Left ventricular ejection fraction, by estimation, is 45 to 50%. The left ventricle has mildly decreased function. The left ventricle demonstrates regional wall motion abnormalities. The basal-to-mid anterior and basal-to-mid anterolateral walls appear hypokinetic based on limited views. The left ventricular internal cavity size was normal in size. There is moderate  concentric left ventricular hypertrophy. Left ventricular diastolic parameters are consistent with Grade III diastolic dysfunction (restrictive). Right Ventricle: The right ventricular size is normal. No increase in right ventricular wall thickness. Right ventricular systolic function is normal. There is normal pulmonary artery systolic pressure. The tricuspid regurgitant velocity is 2.48 m/s, and  with an assumed right atrial pressure of 8 mmHg, the estimated right ventricular systolic pressure is 32.6 mmHg. Left Atrium: Left atrial size was mildly dilated. Right Atrium: Right atrial size was normal in size. Pericardium: There is no evidence of pericardial effusion. Mitral Valve: The mitral valve is normal in structure. There is mild thickening of the mitral valve leaflet(s). There is mild calcification of the mitral valve leaflet(s). Mild mitral annular calcification. Trivial mitral valve regurgitation. Tricuspid  Valve: The tricuspid valve is normal in structure. Tricuspid valve regurgitation is trivial. Aortic Valve: The aortic valve is tricuspid. There is mild calcification of the aortic valve. There is mild thickening of the aortic valve. Aortic valve regurgitation is not visualized. Mild to moderate aortic valve sclerosis/calcification is present, without any evidence of aortic stenosis. Pulmonic Valve: The pulmonic valve was normal in structure. Pulmonic valve regurgitation is not visualized. Aorta: The aortic root and ascending aorta are structurally normal, with no evidence of dilitation. Venous: The inferior vena cava is dilated in size with greater than 50% respiratory variability, suggesting right atrial pressure of 8 mmHg. IAS/Shunts: No atrial level shunt detected by color flow Doppler.  LEFT VENTRICLE PLAX 2D LVIDd:         4.30 cm Diastology LVIDs:         2.80 cm LV e' medial:    5.98 cm/s LV PW:         1.40 cm LV E/e' medial:  19.7 LV IVS:        1.30 cm LV e' lateral:   8.16 cm/s                         LV E/e' lateral: 14.5  RIGHT VENTRICLE             IVC RV Basal diam:  2.70 cm     IVC diam: 2.10 cm RV S prime:     12.20 cm/s TAPSE (M-mode): 2.2 cm LEFT ATRIUM              Index       RIGHT ATRIUM           Index LA diam:        3.80 cm  1.73 cm/m  RA Area:     13.80 cm LA Vol (A2C):   104.0 ml 47.36 ml/m RA Volume:   33.80 ml  15.39 ml/m LA Vol (A4C):   59.6 ml  27.14 ml/m LA Biplane Vol: 82.5 ml  37.57 ml/m   AORTA Ao Asc diam: 3.40 cm MITRAL VALVE                TRICUSPID VALVE MV Area (PHT): 3.60 cm     TR Peak grad:   24.6 mmHg MV Decel Time: 211 msec     TR Vmax:        248.00 cm/s MV E velocity: 118.00 cm/s MV A velocity: 56.90 cm/s MV E/A ratio:  2.07 Laurance Flatten MD Electronically signed by Laurance Flatten MD Signature Date/Time: 02/14/2021/2:54:50 PM    Final       Subjective: Patient seen and examined the bedside this morning.  Medically stable for discharge today.  I called the wife and discussed about the discharge planning.  Discharge Exam: Vitals:   02/17/21 0404 02/17/21 0800  BP: (!) 196/81 (!) 153/87  Pulse: 66 71  Resp: 12 20  Temp: 98.1 F (36.7 C) 97.8 F (36.6 C)  SpO2: 100% 100%   Vitals:   02/16/21 2044 02/17/21 0032 02/17/21 0404 02/17/21 0800  BP: (!) 115/54 (!) 171/65 (!) 196/81 (!) 153/87  Pulse: 68 69 66 71  Resp: (!) 24 18 12 20   Temp: 98.6 F (37 C) 98.5 F (36.9 C) 98.1 F (36.7 C) 97.8 F (36.6 C)  TempSrc: Oral Oral Oral Oral  SpO2: 92% 98% 100% 100%  Weight:      Height:  General: Pt is alert, awake, not in acute distress Cardiovascular: RRR, S1/S2 +, no rubs, no gallops Respiratory: CTA bilaterally, no wheezing, no rhonchi Abdominal: Soft, NT, ND, bowel sounds + Extremities: no edema, no cyanosis    The results of significant diagnostics from this hospitalization (including imaging, microbiology, ancillary and laboratory) are listed below for reference.     Microbiology: Recent Results (from the past 240  hour(s))  Resp Panel by RT-PCR (Flu A&B, Covid) Nasopharyngeal Swab     Status: None   Collection Time: 02/13/21  4:29 PM   Specimen: Nasopharyngeal Swab; Nasopharyngeal(NP) swabs in vial transport medium  Result Value Ref Range Status   SARS Coronavirus 2 by RT PCR NEGATIVE NEGATIVE Final    Comment: (NOTE) SARS-CoV-2 target nucleic acids are NOT DETECTED.  The SARS-CoV-2 RNA is generally detectable in upper respiratory specimens during the acute phase of infection. The lowest concentration of SARS-CoV-2 viral copies this assay can detect is 138 copies/mL. A negative result does not preclude SARS-Cov-2 infection and should not be used as the sole basis for treatment or other patient management decisions. A negative result may occur with  improper specimen collection/handling, submission of specimen other than nasopharyngeal swab, presence of viral mutation(s) within the areas targeted by this assay, and inadequate number of viral copies(<138 copies/mL). A negative result must be combined with clinical observations, patient history, and epidemiological information. The expected result is Negative.  Fact Sheet for Patients:  BloggerCourse.comhttps://www.fda.gov/media/152166/download  Fact Sheet for Healthcare Providers:  SeriousBroker.ithttps://www.fda.gov/media/152162/download  This test is no t yet approved or cleared by the [place] FDA and  has been authorized for detection and/or diagnosis of SARS-CoV-2 by FDA under an Emergency Use Authorization (EUA). This EUA will remain  in effect (meaning this test can be used) for the duration of the COVID-19 declaration under Section 564(b)(1) of the Act, 21 U.S.C.section 360bbb-3(b)(1), unless the authorization is terminated  or revoked sooner.       Influenza A by PCR NEGATIVE NEGATIVE Final   Influenza B by PCR NEGATIVE NEGATIVE Final    Comment: (NOTE) The Xpert Xpress SARS-CoV-2/FLU/RSV plus assay is intended as an aid in the diagnosis of influenza from  Nasopharyngeal swab specimens and should not be used as a sole basis for treatment. Nasal washings and aspirates are unacceptable for Xpert Xpress SARS-CoV-2/FLU/RSV testing.  Fact Sheet for Patients: BloggerCourse.comhttps://www.fda.gov/media/152166/download  Fact Sheet for Healthcare Providers: SeriousBroker.ithttps://www.fda.gov/media/152162/download  This test is not yet approved or cleared by the [place] FDA and has been authorized for detection and/or diagnosis of SARS-CoV-2 by FDA under an Emergency Use Authorization (EUA). This EUA will remain in effect (meaning this test can be used) for the duration of the COVID-19 declaration under Section 564(b)(1) of the Act, 21 U.S.C. section 360bbb-3(b)(1), unless the authorization is terminated or revoked.  Performed at [facility], [address] [place], [place] [id number]      Labs: BNP (last 3 results) Recent Labs    02/13/21 1627  BNP 204.4*   Basic Metabolic Panel: Recent Labs  Lab 02/13/21 1627 02/13/21 1658 02/14/21 0500 02/15/21 0336 02/16/21 0126 02/17/21 0155  NA 139 140 137 139 136 135  K 3.3* 3.3* 3.9 4.7 4.5 4.2  CL 102  --  100 100 97* 97*  CO2 27  --  28 32 31 30  GLUCOSE 202*  --  92 197* 219* 246*  BUN 16  --  16 26* 36* 44*  CREATININE 1.87*  --  1.83*  2.32* 2.30* 2.51*  CALCIUM 8.0*  --  8.2* 8.6* 8.6* 8.4*   Liver Function Tests: No results for input(s): AST, ALT, ALKPHOS, BILITOT, PROT, ALBUMIN in the last 168 hours. No results for input(s): LIPASE, AMYLASE in the last 168 hours. No results for input(s): AMMONIA in the last 168 hours. CBC: Recent Labs  Lab 02/13/21 1627 02/13/21 1658 02/15/21 0336  WBC 10.6*  --  11.1*  NEUTROABS 6.9  --  8.4*  HGB 13.6 12.9* 13.0  HCT 42.5 38.0* 40.7  MCV 88.0  --  87.0  PLT 179  --  169   Cardiac Enzymes: No results for input(s): CKTOTAL, CKMB, CKMBINDEX, TROPONINI in the last 168 hours. BNP: Invalid input(s): POCBNP CBG: Recent Labs  Lab 02/16/21 0730  02/16/21 1136 02/16/21 1551 02/16/21 2043 02/17/21 0757  GLUCAP 152* 99 109* 305* 155*   D-Dimer No results for input(s): DDIMER in the last 72 hours. Hgb A1c Recent Labs    02/15/21 0336  HGBA1C 6.8*   Lipid Profile No results for input(s): CHOL, HDL, LDLCALC, TRIG, CHOLHDL, LDLDIRECT in the last 72 hours. Thyroid function studies Recent Labs    02/15/21 0336  TSH 1.933   Anemia work up No results for input(s): VITAMINB12, FOLATE, FERRITIN, TIBC, IRON, RETICCTPCT in the last 72 hours. Urinalysis    Component Value Date/Time   COLORURINE YELLOW 06/18/2019 1600   APPEARANCEUR CLEAR 06/18/2019 1600   LABSPEC 1.011 06/18/2019 1600   PHURINE 5.0 06/18/2019 1600   GLUCOSEU NEGATIVE 06/18/2019 1600   HGBUR NEGATIVE 06/18/2019 1600   BILIRUBINUR NEGATIVE 06/18/2019 1600   KETONESUR NEGATIVE 06/18/2019 1600   PROTEINUR 100 (A) 06/18/2019 1600   NITRITE NEGATIVE 06/18/2019 1600   LEUKOCYTESUR NEGATIVE 06/18/2019 1600   Sepsis Labs Invalid input(s): PROCALCITONIN,  WBC,  LACTICIDVEN Microbiology Recent Results (from the past 240 hour(s))  Resp Panel by RT-PCR (Flu A&B, Covid) Nasopharyngeal Swab     Status: None   Collection Time: 02/13/21  4:29 PM   Specimen: Nasopharyngeal Swab; Nasopharyngeal(NP) swabs in vial transport medium  Result Value Ref Range Status   SARS Coronavirus 2 by RT PCR NEGATIVE NEGATIVE Final    Comment: (NOTE) SARS-CoV-2 target nucleic acids are NOT DETECTED.  The SARS-CoV-2 RNA is generally detectable in upper respiratory specimens during the acute phase of infection. The lowest concentration of SARS-CoV-2 viral copies this assay can detect is 138 copies/mL. A negative result does not preclude SARS-Cov-2 infection and should not be used as the sole basis for treatment or other patient management decisions. A negative result may occur with  improper specimen collection/handling, submission of specimen other than nasopharyngeal swab, presence of  viral mutation(s) within the areas targeted by this assay, and inadequate number of viral copies(<138 copies/mL). A negative result must be combined with clinical observations, patient history, and epidemiological information. The expected result is Negative.  Fact Sheet for Patients:  BloggerCourse.com  Fact Sheet for Healthcare Providers:  SeriousBroker.it  This test is no t yet approved or cleared by the [place] FDA and  has been authorized for detection and/or diagnosis of SARS-CoV-2 by FDA under an Emergency Use Authorization (EUA). This EUA will remain  in effect (meaning this test can be used) for the duration of the COVID-19 declaration under Section 564(b)(1) of the Act, 21 U.S.C.section 360bbb-3(b)(1), unless the authorization is terminated  or revoked sooner.       Influenza A by PCR NEGATIVE NEGATIVE Final   Influenza B by PCR NEGATIVE NEGATIVE Final  Comment: (NOTE) The Xpert Xpress SARS-CoV-2/FLU/RSV plus assay is intended as an aid in the diagnosis of influenza from Nasopharyngeal swab specimens and should not be used as a sole basis for treatment. Nasal washings and aspirates are unacceptable for Xpert Xpress SARS-CoV-2/FLU/RSV testing.  Fact Sheet for Patients: BloggerCourse.com  Fact Sheet for Healthcare Providers: SeriousBroker.it  This test is not yet approved or cleared by the [place] FDA and has been authorized for detection and/or diagnosis of SARS-CoV-2 by FDA under an Emergency Use Authorization (EUA). This EUA will remain in effect (meaning this test can be used) for the duration of the COVID-19 declaration under Section 564(b)(1) of the Act, 21 U.S.C. section 360bbb-3(b)(1), unless the authorization is terminated or revoked.  Performed at [facility], [address], [place], [place] [address]     Please note: You were  cared for by a hospitalist during your hospital stay. Once you are discharged, your primary care physician will handle any further medical issues. Please note that NO REFILLS for any discharge medications will be authorized once you are discharged, as it is imperative that you return to your primary care physician (or establish a relationship with a primary care physician if you do not have one) for your post hospital discharge needs so that they can reassess your need for medications and monitor your lab values.    Time coordinating discharge: 40 minutes  SIGNED:   Burnadette Pop, MD  [facility] 02/17/2021, 10:55 AM Pager [phone]  If 7PM-7AM, please contact night-coverage [email or web address] Password [email or web address]

## 2021-02-21 ENCOUNTER — Telehealth: Payer: Self-pay | Admitting: Cardiovascular Disease

## 2021-02-21 NOTE — Telephone Encounter (Signed)
*  STAT* If patient is at the pharmacy, call can be transferred to refill team.   1. Which medications need to be refilled? (please list name of each medication and dose if known)  hydrALAZINE (APRESOLINE) 25 MG tablet isosorbide mononitrate (IMDUR) 30 MG 24 hr tablet metoprolol tartrate (LOPRESSOR) 25 MG tablet apixaban (ELIQUIS) 5 MG TABS tablet  2. Which pharmacy/location (including street and city if local pharmacy) is medication to be sent to?  [facility] - [place], [place] - [address]  3. Do they need a 30 day or 90 day supply?  90 day supply

## 2021-02-25 ENCOUNTER — Ambulatory Visit (INDEPENDENT_AMBULATORY_CARE_PROVIDER_SITE_OTHER): Payer: Medicare Other | Admitting: Family Medicine

## 2021-02-25 ENCOUNTER — Encounter: Payer: Self-pay | Admitting: Family Medicine

## 2021-02-25 ENCOUNTER — Telehealth (HOSPITAL_COMMUNITY): Payer: Self-pay

## 2021-02-25 ENCOUNTER — Other Ambulatory Visit: Payer: Self-pay

## 2021-02-25 ENCOUNTER — Other Ambulatory Visit (HOSPITAL_COMMUNITY): Payer: Self-pay

## 2021-02-25 VITALS — BP 138/74 | HR 70 | Temp 98.1°F | Resp 14 | Ht 71.0 in | Wt 219.0 lb

## 2021-02-25 DIAGNOSIS — I5031 Acute diastolic (congestive) heart failure: Secondary | ICD-10-CM | POA: Diagnosis not present

## 2021-02-25 DIAGNOSIS — R062 Wheezing: Secondary | ICD-10-CM

## 2021-02-25 DIAGNOSIS — E119 Type 2 diabetes mellitus without complications: Secondary | ICD-10-CM | POA: Diagnosis not present

## 2021-02-25 MED ORDER — ALBUTEROL SULFATE HFA 108 (90 BASE) MCG/ACT IN AERS: 2.0000 | INHALATION_SPRAY | Freq: Four times a day (QID) | RESPIRATORY_TRACT | 1 refills | Status: DC | PRN

## 2021-02-25 NOTE — Telephone Encounter (Signed)
Attempted to reach patient. LM.
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
## 2021-02-26 LAB — COMPLETE METABOLIC PANEL WITH GFR
AG Ratio: 1.1 (calc) (ref 1.0–2.5)
ALT: 18 U/L (ref 9–46)
AST: 16 U/L (ref 10–35)
Albumin: 3.5 g/dL — ABNORMAL LOW (ref 3.6–5.1)
Alkaline phosphatase (APISO): 75 U/L (ref 35–144)
BUN/Creatinine Ratio: 13 (calc) (ref 6–22)
BUN: 24 mg/dL (ref 7–25)
CO2: 30 mmol/L (ref 20–32)
Calcium: 8.6 mg/dL (ref 8.6–10.3)
Chloride: 103 mmol/L (ref 98–110)
Creat: 1.88 mg/dL — ABNORMAL HIGH (ref 0.70–1.28)
Globulin: 3.1 g/dL (calc) (ref 1.9–3.7)
Glucose, Bld: 69 mg/dL (ref 65–99)
Potassium: 4.3 mmol/L (ref 3.5–5.3)
Sodium: 141 mmol/L (ref 135–146)
Total Bilirubin: 0.3 mg/dL (ref 0.2–1.2)
Total Protein: 6.6 g/dL (ref 6.1–8.1)
eGFR: 37 mL/min/{1.73_m2} — ABNORMAL LOW (ref >=60)

## 2021-02-26 LAB — CBC WITH DIFFERENTIAL/PLATELET
Absolute Monocytes: 1164 cells/uL — ABNORMAL HIGH (ref 200–950)
Basophils Absolute: 36 cells/uL (ref 0–200)
Basophils Relative: 0.3 %
Eosinophils Absolute: 180 cells/uL (ref 15–500)
Eosinophils Relative: 1.5 %
HCT: 40.2 % (ref 38.5–50.0)
Hemoglobin: 12.7 g/dL — ABNORMAL LOW (ref 13.2–17.1)
Lymphs Abs: 2136 cells/uL (ref 850–3900)
MCH: 27.1 pg (ref 27.0–33.0)
MCHC: 31.6 g/dL — ABNORMAL LOW (ref 32.0–36.0)
MCV: 85.7 fL (ref 80.0–100.0)
MPV: 12.5 fL (ref 7.5–12.5)
Monocytes Relative: 9.7 %
Neutro Abs: 8484 cells/uL — ABNORMAL HIGH (ref 1500–7800)
Neutrophils Relative %: 70.7 %
Platelets: 215 10*3/uL (ref 140–400)
RBC: 4.69 10*6/uL (ref 4.20–5.80)
RDW: 13.2 % (ref 11.0–15.0)
Total Lymphocyte: 17.8 %
WBC: 12 10*3/uL — ABNORMAL HIGH (ref 3.8–10.8)

## 2021-02-26 LAB — HEMOGLOBIN A1C
Hgb A1c MFr Bld: 6.7 % of total Hgb — ABNORMAL HIGH (ref <5.7)
Mean Plasma Glucose: 146 mg/dL
eAG (mmol/L): 8.1 mmol/L

## 2021-03-01 ENCOUNTER — Ambulatory Visit (HOSPITAL_BASED_OUTPATIENT_CLINIC_OR_DEPARTMENT_OTHER): Payer: No Typology Code available for payment source | Admitting: Family

## 2021-03-02 ENCOUNTER — Telehealth (HOSPITAL_COMMUNITY): Payer: Self-pay | Admitting: Pharmacist

## 2021-03-02 NOTE — Telephone Encounter (Signed)
Pharmacy Transitions of Care Follow-up Telephone Call   Date of discharge: 02/17/21 Discharge Diagnosis: Acute pulmonary edema   How have you been since you were released from the hospital? LM   Medication changes made at discharge:  - START: Eliquis   Medication changes verified by the patient?  Yes     Medication Accessibility:   [facility]   Was the patient provided with refills on discharged medications? no   Have all prescriptions been transferred from [facility] to home pharmacy? no     Medication Review: (APIXABAN (ELIQUIS) Apixaban 10 mg BID initiated on . Will switch to apixaban 5 mg BID after 7 days (DATE). - Discussed importance of taking medication around the same time everyday - Reviewed potential DDIs with patient - Advised patient of medications to avoid (NSAIDs, ASA) - Educated that Tylenol (acetaminophen) will be the preferred analgesic to prevent risk of bleeding - Emphasized importance of monitoring for signs and symptoms of bleeding (abnormal bruising, prolonged bleeding, nose bleeds, bleeding from gums, discolored urine, black tarry stools) - Advised patient to alert all providers of anticoagulation therapy prior to starting a new medication or having a procedure   CLOPIDOGREL (PLAVIX) Clopidogrel 75 mg once daily. - Educated patient on expected duration of therapy of  with clopidogrel. Advised patient that aspirin will be continued indefinitely. - Reviewed potential DDIs with patient - Advised patient of medications to avoid (NSAIDs, ASA) - Educated that Tylenol (acetaminophen) will be the preferred analgesic to prevent risk of bleeding - Emphasized importance of monitoring for signs and symptoms of bleeding (abnormal bruising, prolonged bleeding, nose bleeds, bleeding from gums, discolored urine, black tarry stools) - Advised patient to alert all providers of anticoagulation therapy prior to starting a new medication or having a procedure      Follow-up Appointments:   [facility] f/u appt confirmed?  Yes If their condition worsens, is the pt aware to call PCP or go to the Emergency Dept.? yes   Final Patient Assessment: Patient doing well.  Reviewed meds.

## 2021-03-03 ENCOUNTER — Other Ambulatory Visit: Payer: Self-pay | Admitting: *Deleted

## 2021-03-03 MED ORDER — DAPAGLIFLOZIN PROPANEDIOL 10 MG PO TABS: 10.0000 mg | ORAL_TABLET | Freq: Every day | ORAL | 3 refills | Status: DC

## 2021-03-07 ENCOUNTER — Other Ambulatory Visit (HOSPITAL_COMMUNITY): Payer: Self-pay

## 2021-03-16 ENCOUNTER — Telehealth: Payer: Self-pay | Admitting: Family Medicine

## 2021-03-16 NOTE — Telephone Encounter (Signed)
Patient's daughter Marcelino Duster came to the office to inform provider that the medication prescribed (farxiga quantity 30) is too expensive ($142); requesting a different and less expensive medication. Please advise at [phone].

## 2021-03-17 NOTE — Telephone Encounter (Signed)
Please advise 

## 2021-03-18 NOTE — Telephone Encounter (Signed)
Call placed to patient. LMTRC.  

## 2021-03-21 MED ORDER — DAPAGLIFLOZIN PROPANEDIOL 10 MG PO TABS: 10.0000 mg | ORAL_TABLET | Freq: Every day | ORAL | 3 refills | Status: DC

## 2021-03-21 NOTE — Telephone Encounter (Signed)
Call placed to patient and patient made aware.   Sent through to [facility] pharmacy.

## 2021-03-25 ENCOUNTER — Encounter (HOSPITAL_BASED_OUTPATIENT_CLINIC_OR_DEPARTMENT_OTHER): Payer: Self-pay | Admitting: Family

## 2021-03-25 ENCOUNTER — Other Ambulatory Visit: Payer: Self-pay

## 2021-03-25 ENCOUNTER — Ambulatory Visit (INDEPENDENT_AMBULATORY_CARE_PROVIDER_SITE_OTHER): Payer: No Typology Code available for payment source | Admitting: Family

## 2021-03-25 ENCOUNTER — Encounter (HOSPITAL_BASED_OUTPATIENT_CLINIC_OR_DEPARTMENT_OTHER): Payer: Self-pay

## 2021-03-25 ENCOUNTER — Telehealth (HOSPITAL_BASED_OUTPATIENT_CLINIC_OR_DEPARTMENT_OTHER): Payer: Self-pay

## 2021-03-25 VITALS — BP 128/68 | HR 69 | Ht 71.0 in | Wt 206.4 lb

## 2021-03-25 DIAGNOSIS — I4892 Unspecified atrial flutter: Secondary | ICD-10-CM

## 2021-03-25 MED ORDER — APIXABAN 5 MG PO TABS: 5.0000 mg | ORAL_TABLET | Freq: Two times a day (BID) | ORAL | 0 refills | Status: DC

## 2021-03-25 MED ORDER — HYDRALAZINE HCL 25 MG PO TABS: 25.0000 mg | ORAL_TABLET | Freq: Three times a day (TID) | ORAL | 0 refills | Status: DC

## 2021-03-25 MED ORDER — METOPROLOL TARTRATE 25 MG PO TABS: 25.0000 mg | ORAL_TABLET | Freq: Two times a day (BID) | ORAL | 0 refills | Status: DC

## 2021-03-25 MED ORDER — ISOSORBIDE MONONITRATE ER 30 MG PO TB24: 30.0000 mg | ORAL_TABLET | Freq: Every day | ORAL | 0 refills | Status: DC

## 2021-03-25 NOTE — Telephone Encounter (Signed)
Patient hospitalized 02/13/21-02/17/21 with acute on chronic systolic and diastolic heart failure, acute hypoxic respiratory failure, COPD exacerbation, atrial fibrillation/flutter. Lisinopril and Lasix were held on discharge due to kidney injury. He had bloodwork after discharge with primary care provider on 02/25/21 with creatinine improved to 1.88, GFR 37, wbc 12, Hb 12.7.   He was scheduled for follow up 03/01/21 which he cancelled. I speak with daughter to offer to see patient and apologize for the wait but they were unwilling.   On discharge his cardiac medications included Eliquis 5mg  BID, Hydralazine 25mg  TID, Imdur 30mg  QD, Lopressor 25mg  BID. He was provided 30 day supply on discharge. As I am uncertain whether he has his medications, will send 30 day supply to his local pharmacy on ).  He previously followed with Dr. in 2019. Seen by Dr. Public relations account executive 01/02/20 for vascular consult. During more recent hospitalization he was seen by Dr. Antoine Poche.   Will route to scheduling team to assist rescheduling his hospital follow up appt.  2020, NP

## 2021-03-25 NOTE — Addendum Note (Signed)
Addended by: Alver Sorrow on: 03/25/2021 11:07 PM   Modules accepted: Orders

## 2021-03-25 NOTE — Telephone Encounter (Signed)
Pt arrived at 2:47 for 3:30 appointment.  Other patients arrived for earlier appointments and were taken to exam rooms to be seen in order of appt times. Patients daughter verbalized concern about her dad not being seen (at 4:00).  Writer went out to bring pt back to see provider,  patient, his daughter and another male were on the elevator.  Writer offered to take pt to be seen, daughter declined and stated "no thank you, we're good."  Jim Like MHA RN CCM

## 2021-03-27 NOTE — Progress Notes (Signed)
Patient not seen. See phone note 03/25/21.Alver Sorrow, NP

## 2021-03-30 ENCOUNTER — Telehealth: Payer: Self-pay | Admitting: *Deleted

## 2021-03-30 NOTE — Telephone Encounter (Signed)
Received call from patient daughter Caleb Cuevas.   Requested to change referral for cardiology to [facility] on [address] in [place].

## 2021-04-22 ENCOUNTER — Encounter: Payer: Self-pay | Admitting: Physician Assistant

## 2021-04-22 ENCOUNTER — Other Ambulatory Visit: Payer: Self-pay

## 2021-04-22 ENCOUNTER — Ambulatory Visit (INDEPENDENT_AMBULATORY_CARE_PROVIDER_SITE_OTHER): Payer: Medicare Other | Admitting: Physician Assistant

## 2021-04-22 VITALS — BP 148/74 | HR 71 | Ht 72.0 in | Wt 205.6 lb

## 2021-04-22 DIAGNOSIS — N183 Chronic kidney disease, stage 3 unspecified: Secondary | ICD-10-CM

## 2021-04-22 DIAGNOSIS — I10 Essential (primary) hypertension: Secondary | ICD-10-CM

## 2021-04-22 DIAGNOSIS — E119 Type 2 diabetes mellitus without complications: Secondary | ICD-10-CM

## 2021-04-22 DIAGNOSIS — I519 Heart disease, unspecified: Secondary | ICD-10-CM | POA: Diagnosis not present

## 2021-04-22 DIAGNOSIS — I2510 Atherosclerotic heart disease of native coronary artery without angina pectoris: Secondary | ICD-10-CM

## 2021-04-22 DIAGNOSIS — E785 Hyperlipidemia, unspecified: Secondary | ICD-10-CM

## 2021-04-22 DIAGNOSIS — I480 Paroxysmal atrial fibrillation: Secondary | ICD-10-CM | POA: Diagnosis not present

## 2021-04-22 MED ORDER — METOPROLOL SUCCINATE ER 50 MG PO TB24: 50.0000 mg | ORAL_TABLET | Freq: Every day | ORAL | 3 refills | Status: DC

## 2021-04-22 MED ORDER — METOPROLOL SUCCINATE ER 50 MG PO TB24: 25.0000 mg | ORAL_TABLET | Freq: Every day | ORAL | 3 refills | Status: DC

## 2021-04-22 NOTE — Patient Instructions (Signed)
Medication Instructions:  STOP Metoprolol Tartrate  START Metoprolol Succinate (Toprol-XL) 25 mg daily *If you need a refill on your cardiac medications before your next appointment, please call your pharmacy*  Lab Work: Your physician recommends that you return for lab work inTODAY:  BMET If you have labs (blood work) drawn today and your tests are completely normal, you will receive your results only by: MyChart Message (if you have MyChart) OR A paper copy in the mail If you have any lab test that is abnormal or we need to change your treatment, we will call you to review the results.  Testing/Procedures: NONE ordered at this time of appointment   Follow-Up: At [facility], you and your health needs are our priority.  As part of our continuing mission to provide you with exceptional heart care, we have created designated Provider Care Teams.  These Care Teams include your primary Cardiologist (physician) and Advanced Practice Providers (APPs -  Physician Assistants and Nurse Practitioners) who all work together to provide you with the care you need, when you need it.  Your next appointment:   3 month(s)  The format for your next appointment:   In Person  Provider:   Rollene Rotunda, MD   Other Instructions
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
## 2021-04-23 LAB — BASIC METABOLIC PANEL
BUN/Creatinine Ratio: 11 (ref 10–24)
BUN: 21 mg/dL (ref 8–27)
CO2: 23 mmol/L (ref 20–29)
Calcium: 8.3 mg/dL — ABNORMAL LOW (ref 8.6–10.2)
Chloride: 105 mmol/L (ref 96–106)
Creatinine, Ser: 1.99 mg/dL — ABNORMAL HIGH (ref 0.76–1.27)
Glucose: 131 mg/dL — ABNORMAL HIGH (ref 65–99)
Potassium: 4.9 mmol/L (ref 3.5–5.2)
Sodium: 141 mmol/L (ref 134–144)
eGFR: 35 mL/min/{1.73_m2} — ABNORMAL LOW (ref >59)

## 2021-04-25 ENCOUNTER — Ambulatory Visit (INDEPENDENT_AMBULATORY_CARE_PROVIDER_SITE_OTHER): Payer: Medicare Other | Admitting: Family Medicine

## 2021-04-25 ENCOUNTER — Other Ambulatory Visit: Payer: Self-pay

## 2021-04-25 VITALS — BP 144/70 | HR 89 | Temp 98.7°F | Ht 72.0 in

## 2021-04-25 DIAGNOSIS — I5030 Unspecified diastolic (congestive) heart failure: Secondary | ICD-10-CM

## 2021-04-25 DIAGNOSIS — Z23 Encounter for immunization: Secondary | ICD-10-CM

## 2021-04-25 DIAGNOSIS — R630 Anorexia: Secondary | ICD-10-CM

## 2021-04-25 DIAGNOSIS — E119 Type 2 diabetes mellitus without complications: Secondary | ICD-10-CM | POA: Diagnosis not present

## 2021-04-25 DIAGNOSIS — J449 Chronic obstructive pulmonary disease, unspecified: Secondary | ICD-10-CM

## 2021-04-25 DIAGNOSIS — R296 Repeated falls: Secondary | ICD-10-CM | POA: Diagnosis not present

## 2021-04-25 MED ORDER — DULOXETINE HCL 60 MG PO CPEP: 60.0000 mg | ORAL_CAPSULE | Freq: Two times a day (BID) | ORAL | 3 refills | Status: DC

## 2021-04-25 NOTE — Progress Notes (Signed)
Kidney function still down, slightly worse than a month ago, repeat BMET in 2 weeks, refer to nephrology service.
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
## 2021-04-25 NOTE — Addendum Note (Signed)
Addended by: Winn Jock on: 04/25/2021 11:45 AM   Modules accepted: Orders

## 2021-04-27 LAB — CBC WITH DIFFERENTIAL/PLATELET
Absolute Monocytes: 726 cells/uL (ref 200–950)
Basophils Absolute: 30 cells/uL (ref 0–200)
Basophils Relative: 0.5 %
Eosinophils Absolute: 59 cells/uL (ref 15–500)
Eosinophils Relative: 1 %
HCT: 43.1 % (ref 38.5–50.0)
Hemoglobin: 13.8 g/dL (ref 13.2–17.1)
Lymphs Abs: 1416 cells/uL (ref 850–3900)
MCH: 27.1 pg (ref 27.0–33.0)
MCHC: 32 g/dL (ref 32.0–36.0)
MCV: 84.5 fL (ref 80.0–100.0)
MPV: 12.2 fL (ref 7.5–12.5)
Monocytes Relative: 12.3 %
Neutro Abs: 3670 cells/uL (ref 1500–7800)
Neutrophils Relative %: 62.2 %
Platelets: 188 10*3/uL (ref 140–400)
RBC: 5.1 10*6/uL (ref 4.20–5.80)
RDW: 14.5 % (ref 11.0–15.0)
Total Lymphocyte: 24 %
WBC: 5.9 10*3/uL (ref 3.8–10.8)

## 2021-04-27 LAB — COMPLETE METABOLIC PANEL WITH GFR
AG Ratio: 1 (calc) (ref 1.0–2.5)
ALT: 189 U/L — ABNORMAL HIGH (ref 9–46)
AST: 155 U/L — ABNORMAL HIGH (ref 10–35)
Albumin: 3.2 g/dL — ABNORMAL LOW (ref 3.6–5.1)
Alkaline phosphatase (APISO): 84 U/L (ref 35–144)
BUN/Creatinine Ratio: 12 (calc) (ref 6–22)
BUN: 26 mg/dL — ABNORMAL HIGH (ref 7–25)
CO2: 23 mmol/L (ref 20–32)
Calcium: 7.8 mg/dL — ABNORMAL LOW (ref 8.6–10.3)
Chloride: 102 mmol/L (ref 98–110)
Creat: 2.2 mg/dL — ABNORMAL HIGH (ref 0.70–1.28)
Globulin: 3.1 g/dL (calc) (ref 1.9–3.7)
Glucose, Bld: 327 mg/dL — ABNORMAL HIGH (ref 65–99)
Potassium: 4.5 mmol/L (ref 3.5–5.3)
Sodium: 136 mmol/L (ref 135–146)
Total Bilirubin: 0.5 mg/dL (ref 0.2–1.2)
Total Protein: 6.3 g/dL (ref 6.1–8.1)
eGFR: 31 mL/min/{1.73_m2} — ABNORMAL LOW (ref >=60)

## 2021-04-27 LAB — ANGIOTENSIN CONVERTING ENZYME: Angiotensin-Converting Enzyme: <5 U/L — ABNORMAL LOW (ref 9–67)

## 2021-04-29 ENCOUNTER — Other Ambulatory Visit: Payer: Self-pay

## 2021-04-29 ENCOUNTER — Other Ambulatory Visit: Payer: Medicare Other

## 2021-04-29 DIAGNOSIS — N183 Chronic kidney disease, stage 3 unspecified: Secondary | ICD-10-CM

## 2021-04-29 DIAGNOSIS — R7989 Other specified abnormal findings of blood chemistry: Secondary | ICD-10-CM | POA: Diagnosis not present

## 2021-04-29 DIAGNOSIS — R6889 Other general symptoms and signs: Secondary | ICD-10-CM | POA: Diagnosis not present

## 2021-04-29 DIAGNOSIS — R945 Abnormal results of liver function studies: Secondary | ICD-10-CM | POA: Diagnosis not present

## 2021-05-01 ENCOUNTER — Other Ambulatory Visit (HOSPITAL_BASED_OUTPATIENT_CLINIC_OR_DEPARTMENT_OTHER): Payer: Self-pay | Admitting: Family

## 2021-05-02 LAB — HEPATITIS PANEL, ACUTE
Hep A IgM: NONREACTIVE
Hep B C IgM: NONREACTIVE
Hepatitis B Surface Ag: NONREACTIVE
Hepatitis C Ab: NONREACTIVE
SIGNAL TO CUT-OFF: 0.16 (ref <1.00)

## 2021-05-02 NOTE — Telephone Encounter (Signed)
Rx(s) sent to pharmacy electronically.  

## 2021-05-03 ENCOUNTER — Telehealth: Payer: Self-pay | Admitting: Family Medicine

## 2021-05-03 NOTE — Telephone Encounter (Signed)
Spoke with Dois Davenport (daughter) to schedule Medicare Annual Wellness Visit (AWV)    She stated she would call back to schedule  *due 08/07/2012 awvi per [facility]  please schedule at anytime with health coach  This should be a 45 minute visit.

## 2021-05-06 ENCOUNTER — Telehealth: Payer: Self-pay | Admitting: Cardiology

## 2021-05-06 NOTE — Telephone Encounter (Signed)
*  STAT* If patient is at the pharmacy, call can be transferred to refill team.   1. Which medications need to be refilled? (please list name of each medication and dose if known)  apixaban (ELIQUIS) 5 MG TABS tablet  2. Which pharmacy/location (including street and city if local pharmacy) is medication to be sent to? [id number] - [place] ([place]), [place] - [address]  3. Do they need a 30 day or 90 day supply? 90

## 2021-05-16 ENCOUNTER — Encounter: Payer: Self-pay | Admitting: Family Medicine

## 2021-05-16 ENCOUNTER — Other Ambulatory Visit: Payer: Self-pay

## 2021-05-16 ENCOUNTER — Ambulatory Visit (INDEPENDENT_AMBULATORY_CARE_PROVIDER_SITE_OTHER): Payer: Medicare Other | Admitting: Family Medicine

## 2021-05-16 VITALS — BP 138/84 | HR 74 | Temp 98.2°F | Resp 16 | Wt 208.0 lb

## 2021-05-16 DIAGNOSIS — R7989 Other specified abnormal findings of blood chemistry: Secondary | ICD-10-CM | POA: Diagnosis not present

## 2021-05-16 MED ORDER — BREZTRI AEROSPHERE 160-9-4.8 MCG/ACT IN AERO: 2.0000 | INHALATION_SPRAY | Freq: Two times a day (BID) | RESPIRATORY_TRACT | 11 refills | Status: DC

## 2021-05-16 MED ORDER — ISOSORBIDE MONONITRATE ER 30 MG PO TB24: 30.0000 mg | ORAL_TABLET | Freq: Every day | ORAL | 0 refills | Status: DC

## 2021-05-16 MED ORDER — APIXABAN 5 MG PO TABS: 5.0000 mg | ORAL_TABLET | Freq: Two times a day (BID) | ORAL | 3 refills | Status: DC

## 2021-05-16 MED ORDER — ISOSORBIDE MONONITRATE ER 30 MG PO TB24: 30.0000 mg | ORAL_TABLET | Freq: Every day | ORAL | 3 refills | Status: DC

## 2021-05-16 MED ORDER — ALBUTEROL SULFATE HFA 108 (90 BASE) MCG/ACT IN AERS: 2.0000 | INHALATION_SPRAY | Freq: Four times a day (QID) | RESPIRATORY_TRACT | 1 refills | Status: DC | PRN
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
## 2021-05-17 LAB — CBC WITH DIFFERENTIAL/PLATELET
Absolute Monocytes: 774 cells/uL (ref 200–950)
Basophils Absolute: 44 cells/uL (ref 0–200)
Basophils Relative: 0.5 %
Eosinophils Absolute: 282 cells/uL (ref 15–500)
Eosinophils Relative: 3.2 %
HCT: 39.4 % (ref 38.5–50.0)
Hemoglobin: 12.5 g/dL — ABNORMAL LOW (ref 13.2–17.1)
Lymphs Abs: 2253 cells/uL (ref 850–3900)
MCH: 26.9 pg — ABNORMAL LOW (ref 27.0–33.0)
MCHC: 31.7 g/dL — ABNORMAL LOW (ref 32.0–36.0)
MCV: 84.7 fL (ref 80.0–100.0)
MPV: 12.1 fL (ref 7.5–12.5)
Monocytes Relative: 8.8 %
Neutro Abs: 5447 cells/uL (ref 1500–7800)
Neutrophils Relative %: 61.9 %
Platelets: 196 10*3/uL (ref 140–400)
RBC: 4.65 10*6/uL (ref 4.20–5.80)
RDW: 14.4 % (ref 11.0–15.0)
Total Lymphocyte: 25.6 %
WBC: 8.8 10*3/uL (ref 3.8–10.8)

## 2021-05-17 LAB — COMPLETE METABOLIC PANEL WITH GFR
AG Ratio: 1.1 (calc) (ref 1.0–2.5)
ALT: 14 U/L (ref 9–46)
AST: 14 U/L (ref 10–35)
Albumin: 3.4 g/dL — ABNORMAL LOW (ref 3.6–5.1)
Alkaline phosphatase (APISO): 74 U/L (ref 35–144)
BUN/Creatinine Ratio: 11 (calc) (ref 6–22)
BUN: 22 mg/dL (ref 7–25)
CO2: 27 mmol/L (ref 20–32)
Calcium: 8.1 mg/dL — ABNORMAL LOW (ref 8.6–10.3)
Chloride: 104 mmol/L (ref 98–110)
Creat: 1.92 mg/dL — ABNORMAL HIGH (ref 0.70–1.28)
Globulin: 3 g/dL (calc) (ref 1.9–3.7)
Glucose, Bld: 236 mg/dL — ABNORMAL HIGH (ref 65–99)
Potassium: 4.8 mmol/L (ref 3.5–5.3)
Sodium: 139 mmol/L (ref 135–146)
Total Bilirubin: 0.5 mg/dL (ref 0.2–1.2)
Total Protein: 6.4 g/dL (ref 6.1–8.1)
eGFR: 36 mL/min/{1.73_m2} — ABNORMAL LOW (ref >=60)

## 2021-05-18 ENCOUNTER — Telehealth: Payer: Self-pay | Admitting: *Deleted

## 2021-05-18 NOTE — Telephone Encounter (Signed)
Received call from [facility] pharmacy.   Reports that Breztri inhaler is not on current formulary. States that inhaler is only able to be approved if patient cannot tolerate using (2) inhalers.   Formulary alternatives include Advair 250/50 and Spiriva Respimat for COPD.   Please advise.

## 2021-05-19 MED ORDER — FLUTICASONE-SALMETEROL 250-50 MCG/ACT IN AEPB: 1.0000 | INHALATION_SPRAY | Freq: Two times a day (BID) | RESPIRATORY_TRACT | 3 refills | Status: DC

## 2021-05-19 MED ORDER — SPIRIVA RESPIMAT 2.5 MCG/ACT IN AERS: 2.0000 | INHALATION_SPRAY | Freq: Every day | RESPIRATORY_TRACT | 3 refills | Status: AC

## 2021-05-19 NOTE — Telephone Encounter (Signed)
Call placed to patient and patient daughter made aware.  Prescription sent to pharmacy.

## 2021-06-29 ENCOUNTER — Other Ambulatory Visit: Payer: Self-pay

## 2021-06-29 ENCOUNTER — Telehealth: Payer: Self-pay | Admitting: Cardiology

## 2021-06-29 DIAGNOSIS — I5030 Unspecified diastolic (congestive) heart failure: Secondary | ICD-10-CM | POA: Diagnosis not present

## 2021-06-29 DIAGNOSIS — I4891 Unspecified atrial fibrillation: Secondary | ICD-10-CM | POA: Diagnosis not present

## 2021-06-29 DIAGNOSIS — E785 Hyperlipidemia, unspecified: Secondary | ICD-10-CM | POA: Diagnosis not present

## 2021-06-29 DIAGNOSIS — E1122 Type 2 diabetes mellitus with diabetic chronic kidney disease: Secondary | ICD-10-CM | POA: Diagnosis not present

## 2021-06-29 DIAGNOSIS — I129 Hypertensive chronic kidney disease with stage 1 through stage 4 chronic kidney disease, or unspecified chronic kidney disease: Secondary | ICD-10-CM | POA: Diagnosis not present

## 2021-06-29 DIAGNOSIS — I739 Peripheral vascular disease, unspecified: Secondary | ICD-10-CM | POA: Diagnosis not present

## 2021-06-29 DIAGNOSIS — J449 Chronic obstructive pulmonary disease, unspecified: Secondary | ICD-10-CM | POA: Diagnosis not present

## 2021-06-29 DIAGNOSIS — N1832 Chronic kidney disease, stage 3b: Secondary | ICD-10-CM | POA: Diagnosis not present

## 2021-06-29 DIAGNOSIS — W19XXXA Unspecified fall, initial encounter: Secondary | ICD-10-CM | POA: Diagnosis not present

## 2021-06-29 NOTE — Progress Notes (Signed)
Pt's wife came in to office to bring pt's medications for review Per wife pt's kidney doctor advised pt stop lisinopril. This has been removed from med list. The remainder of his medications have been updated.

## 2021-06-29 NOTE — Telephone Encounter (Signed)
Spoke with pt's wife. After speaking with Micah Flesher, PA and going over pt's chart. It's established pt's Plavix is managed by Neurology and the Eliquis is managed for cardiology. Pt is to continue both medications until otherwise instructed. Pt's wife is coming in Monday around 1:30 to compare the medications pt is taking and our list.

## 2021-06-29 NOTE — Telephone Encounter (Signed)
Spoke with Marchelle Folks about pt's medication list. It is unclear why the pt is on Eliquis and Plavix. She reached out to pt's PCP and was told pt is no longer on Lisinopril but according to our list he is still taking it. Unclear is pt is to take Metoprolol succinate 50 mg daily or 25 mg daily. Pt was last seen by Lisabeth Devoid, PA who is currently out off the office. Will forward message to prior provider for clarification, Gillian Shields, NP.

## 2021-07-04 NOTE — Telephone Encounter (Signed)
Called pt's wife to let her know I am off this afternoon and she can come sooner so I can review her husband's medications. No answer, left message for her to return the call.

## 2021-07-05 ENCOUNTER — Other Ambulatory Visit: Payer: Self-pay | Admitting: Internal Medicine

## 2021-07-05 DIAGNOSIS — N1832 Chronic kidney disease, stage 3b: Secondary | ICD-10-CM

## 2021-07-05 NOTE — Telephone Encounter (Signed)
Late entry: Pt's wife came into the office to discuss her husband's medications. We went over the medications he is currently taking. Medication list updated and printed off for her. She was advised to reach out to the provider that prescribed the Pravastatin and have labs drawn since the pt ran out of the medication and has not had Lipid labs drawn in 6 months. She verbalized understanding and stated she would reach out to the provider.

## 2021-07-21 DIAGNOSIS — I2510 Atherosclerotic heart disease of native coronary artery without angina pectoris: Secondary | ICD-10-CM | POA: Insufficient documentation

## 2021-07-21 DIAGNOSIS — I5042 Chronic combined systolic (congestive) and diastolic (congestive) heart failure: Secondary | ICD-10-CM | POA: Insufficient documentation
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
## 2021-07-22 ENCOUNTER — Encounter: Payer: Self-pay | Admitting: Cardiology

## 2021-07-22 ENCOUNTER — Encounter: Payer: Self-pay | Admitting: Radiology

## 2021-07-22 ENCOUNTER — Other Ambulatory Visit: Payer: Self-pay

## 2021-07-22 ENCOUNTER — Ambulatory Visit (INDEPENDENT_AMBULATORY_CARE_PROVIDER_SITE_OTHER): Payer: Medicare Other | Admitting: Cardiology

## 2021-07-22 VITALS — BP 136/72 | HR 78 | Ht 72.0 in | Wt 208.0 lb

## 2021-07-22 DIAGNOSIS — I10 Essential (primary) hypertension: Secondary | ICD-10-CM

## 2021-07-22 DIAGNOSIS — N183 Chronic kidney disease, stage 3 unspecified: Secondary | ICD-10-CM | POA: Diagnosis not present

## 2021-07-22 DIAGNOSIS — I2510 Atherosclerotic heart disease of native coronary artery without angina pectoris: Secondary | ICD-10-CM | POA: Diagnosis not present

## 2021-07-22 DIAGNOSIS — I480 Paroxysmal atrial fibrillation: Secondary | ICD-10-CM | POA: Diagnosis not present

## 2021-07-22 DIAGNOSIS — I5042 Chronic combined systolic (congestive) and diastolic (congestive) heart failure: Secondary | ICD-10-CM

## 2021-07-22 DIAGNOSIS — E785 Hyperlipidemia, unspecified: Secondary | ICD-10-CM | POA: Diagnosis not present

## 2021-07-22 MED ORDER — METOPROLOL SUCCINATE ER 50 MG PO TB24: 50.0000 mg | ORAL_TABLET | Freq: Two times a day (BID) | ORAL | 3 refills | Status: DC

## 2021-07-22 NOTE — Patient Instructions (Signed)
Medication Instructions:  Take metoprolol 50 mg twice a day.  *If you need a refill on your cardiac medications before your next appointment, please call your pharmacy*   Testing/Procedures: [employer] Monitor Instructions  Your physician has requested you wear a ZIO patch monitor for 30 days.  This is a single patch monitor. [employer] supplies one patch monitor per enrollment. Additional stickers are not available. Please do not apply patch if you will be having a Nuclear Stress Test,  Echocardiogram, Cardiac CT, MRI, or Chest Xray during the period you would be wearing the  monitor. The patch cannot be worn during these tests. You cannot remove and re-apply the  ZIO XT patch monitor.  Your ZIO patch monitor will be mailed 3 day USPS to your address on file. It may take 3-5 days  to receive your monitor after you have been enrolled.  Once you have received your monitor, please review the enclosed instructions. Your monitor  has already been registered assigning a specific monitor serial # to you.  Billing and Patient Assistance Program Information  We have supplied [employer] with any of your insurance information on file for billing purposes. [employer] offers a sliding scale Patient Assistance Program for patients that do not have  insurance, or whose insurance does not completely cover the cost of the ZIO monitor.  You must apply for the Patient Assistance Program to qualify for this discounted rate.  To apply, please call [employer] at [phone], select option 4, select option 2, ask to apply for  Patient Assistance Program. Theodore Demark will ask your household income, and how many people  are in your household. They will quote your out-of-pocket cost based on that information.  [employer] will also be able to set up a 59-month, interest-free payment plan if needed.  Applying the monitor   Shave hair from upper left chest.  Hold abrader disc by orange tab. Rub abrader in 40 strokes  over the upper left chest as  indicated in your monitor instructions.  Clean area with 4 enclosed alcohol pads. Let dry.  Apply patch as indicated in monitor instructions. Patch will be placed under collarbone on left  side of chest with arrow pointing upward.  Rub patch adhesive wings for 2 minutes. Remove white label marked "1". Remove the white  label marked "2". Rub patch adhesive wings for 2 additional minutes.  While looking in a mirror, press and release button in center of patch. A small green light will  flash 3-4 times. This will be your only indicator that the monitor has been turned on.  Do not shower for the first 24 hours. You may shower after the first 24 hours.  Press the button if you feel a symptom. You will hear a small click. Record Date, Time and  Symptom in the Patient Logbook.  When you are ready to remove the patch, follow instructions on the last 2 pages of Patient  Logbook. Stick patch monitor onto the last page of Patient Logbook.  Place Patient Logbook in the blue and white box. Use locking tab on box and tape box closed  securely. The blue and white box has prepaid postage on it. Please place it in the mailbox as  soon as possible. Your physician should have your test results approximately 7 days after the  monitor has been mailed back to [facility].  Call [place] at [phone] if you have questions regarding  your ZIO XT patch monitor. Call them immediately  if you see an orange light blinking on your  monitor.  If your monitor falls off in less than 4 days, contact our Monitor department at [phone].  If your monitor becomes loose or falls off after 4 days call [employer] at [phone] for  suggestions on securing your monitor  Follow-Up: At [facility], you and your health needs are our priority.  As part of our continuing mission to provide you with exceptional heart care, we have created designated Provider Care Teams.   These Care Teams include your primary Cardiologist (physician) and Advanced Practice Providers (APPs -  Physician Assistants and Nurse Practitioners) who all work together to provide you with the care you need, when you need it.  We recommend signing up for the patient portal called "MyChart".  Sign up information is provided on this After Visit Summary.  MyChart is used to connect with patients for Virtual Visits (Telemedicine).  Patients are able to view lab/test results, encounter notes, upcoming appointments, etc.  Non-urgent messages can be sent to your provider as well.   To learn more about what you can do with MyChart, go to [email or web address].    Your next appointment:   2 month(s)  The format for your next appointment:   In Person  Provider:   Azalee Course, [place]

## 2021-07-22 NOTE — Progress Notes (Signed)
Enrolled patient for a 30 day [employer] Event Monitor to be mailed to patients home  

## 2021-07-27 ENCOUNTER — Ambulatory Visit
Admission: RE | Admit: 2021-07-27 | Discharge: 2021-07-27 | Disposition: A | Discharge status: Home / Self Care | Payer: Medicare Other | Source: Ambulatory Visit | Attending: Internal Medicine | Admitting: Internal Medicine

## 2021-07-27 DIAGNOSIS — N1832 Chronic kidney disease, stage 3b: Secondary | ICD-10-CM | POA: Diagnosis not present

## 2021-08-04 ENCOUNTER — Ambulatory Visit (INDEPENDENT_AMBULATORY_CARE_PROVIDER_SITE_OTHER): Payer: Medicare Other

## 2021-08-04 ENCOUNTER — Telehealth: Payer: Self-pay | Admitting: Physician Assistant

## 2021-08-04 DIAGNOSIS — I2510 Atherosclerotic heart disease of native coronary artery without angina pectoris: Secondary | ICD-10-CM | POA: Diagnosis not present

## 2021-08-04 DIAGNOSIS — N183 Chronic kidney disease, stage 3 unspecified: Secondary | ICD-10-CM | POA: Diagnosis not present

## 2021-08-04 DIAGNOSIS — I10 Essential (primary) hypertension: Secondary | ICD-10-CM

## 2021-08-04 DIAGNOSIS — I480 Paroxysmal atrial fibrillation: Secondary | ICD-10-CM | POA: Diagnosis not present

## 2021-08-04 DIAGNOSIS — E785 Hyperlipidemia, unspecified: Secondary | ICD-10-CM | POA: Diagnosis not present

## 2021-08-04 DIAGNOSIS — I5042 Chronic combined systolic (congestive) and diastolic (congestive) heart failure: Secondary | ICD-10-CM | POA: Diagnosis not present

## 2021-08-04 NOTE — Telephone Encounter (Signed)
Paged by [employer] about manually trigger  event at 6:33cm.  Underlying rhythm is sinus with PAC.   I have reach patient and emergency contact but no one has picked up phone. Left voice mail to call back.

## 2021-08-05 ENCOUNTER — Telehealth: Payer: Self-pay

## 2021-08-05 NOTE — Telephone Encounter (Signed)
Spoke with pt's daughter, she will call her mother to check on her father. She will follow up with [place].
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
## 2021-08-12 ENCOUNTER — Other Ambulatory Visit: Payer: Self-pay

## 2021-08-12 ENCOUNTER — Ambulatory Visit (INDEPENDENT_AMBULATORY_CARE_PROVIDER_SITE_OTHER): Payer: Medicare Other

## 2021-08-12 VITALS — Ht 72.0 in | Wt 208.0 lb

## 2021-08-12 DIAGNOSIS — I5030 Unspecified diastolic (congestive) heart failure: Secondary | ICD-10-CM | POA: Diagnosis not present

## 2021-08-12 DIAGNOSIS — J449 Chronic obstructive pulmonary disease, unspecified: Secondary | ICD-10-CM | POA: Diagnosis not present

## 2021-08-12 DIAGNOSIS — R296 Repeated falls: Secondary | ICD-10-CM | POA: Diagnosis not present

## 2021-08-12 DIAGNOSIS — Z0000 Encounter for general adult medical examination without abnormal findings: Secondary | ICD-10-CM | POA: Diagnosis not present

## 2021-08-12 DIAGNOSIS — E119 Type 2 diabetes mellitus without complications: Secondary | ICD-10-CM

## 2021-08-12 NOTE — Addendum Note (Signed)
Addended by: Venia Carbon K on: 08/12/2021 04:29 PM   Modules accepted: Orders
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
## 2021-08-12 NOTE — Patient Instructions (Signed)
Caleb Cuevas , Thank you for taking time to come for your Medicare Wellness Visit. I appreciate your ongoing commitment to your health goals. Please review the following plan we discussed and let me know if I can assist you in the future.   Screening recommendations/referrals: Colonoscopy: Due. Call office when ready to schedule.  Recommended yearly ophthalmology/optometry visit for glaucoma screening and checkup Recommended yearly dental visit for hygiene and checkup  Vaccinations: Influenza vaccine: Done 04/25/2021  Pneumococcal vaccine: Done 05/25/2015 and 05/21/2017 Tdap vaccine: Done 05/21/2012 Repeat in 10 years  Shingles vaccine: Shingrix discussed. Please contact your pharmacy for coverage information.     Covid-19: Declined.  Advanced directives: Copy in chart.  Conditions/risks identified: Aim for 30 minutes of exercise each day, drink 6-8 glasses of water and eat lots of fruits and vegetables.   Next appointment: Follow up in one year for your annual wellness visit. 2024.  Preventive Care 40 Years and Older, Male  Preventive care refers to lifestyle choices and visits with your health care provider that can promote health and wellness. What does preventive care include? A yearly physical exam. This is also called an annual well check. Dental exams once or twice a year. Routine eye exams. Ask your health care provider how often you should have your eyes checked. Personal lifestyle choices, including: Daily care of your teeth and gums. Regular physical activity. Eating a healthy diet. Avoiding tobacco and drug use. Limiting alcohol use. Practicing safe sex. Taking low doses of aspirin every day. Taking vitamin and mineral supplements as recommended by your health care provider. What happens during an annual well check? The services and screenings done by your health care provider during your annual well check will depend on your age, overall health, lifestyle risk  factors, and family history of disease. Counseling  Your health care provider may ask you questions about your: Alcohol use. Tobacco use. Drug use. Emotional well-being. Home and relationship well-being. Sexual activity. Eating habits. History of falls. Memory and ability to understand (cognition). Work and work Astronomer. Screening  You may have the following tests or measurements: Height, weight, and BMI. Blood pressure. Lipid and cholesterol levels. These may be checked every 5 years, or more frequently if you are over 48 years old. Skin check. Lung cancer screening. You may have this screening every year starting at age 37 if you have a 30-pack-year history of smoking and currently smoke or have quit within the past 15 years. Fecal occult blood test (FOBT) of the stool. You may have this test every year starting at age 37. Flexible sigmoidoscopy or colonoscopy. You may have a sigmoidoscopy every 5 years or a colonoscopy every 10 years starting at age 64. Prostate cancer screening. Recommendations will vary depending on your family history and other risks. Hepatitis C blood test. Hepatitis B blood test. Sexually transmitted disease (STD) testing. Diabetes screening. This is done by checking your blood sugar (glucose) after you have not eaten for a while (fasting). You may have this done every 1-3 years. Abdominal aortic aneurysm (AAA) screening. You may need this if you are a current or former smoker. Osteoporosis. You may be screened starting at age 81 if you are at high risk. Talk with your health care provider about your test results, treatment options, and if necessary, the need for more tests. Vaccines  Your health care provider may recommend certain vaccines, such as: Influenza vaccine. This is recommended every year. Tetanus, diphtheria, and acellular pertussis (Tdap, Td) vaccine. You may  need a Td booster every 10 years. Zoster vaccine. You may need this after age  48. Pneumococcal 13-valent conjugate (PCV13) vaccine. One dose is recommended after age 108. Pneumococcal polysaccharide (PPSV23) vaccine. One dose is recommended after age 71. Talk to your health care provider about which screenings and vaccines you need and how often you need them. This information is not intended to replace advice given to you by your health care provider. Make sure you discuss any questions you have with your health care provider. Document Released: 08/20/2015 Document Revised: 04/12/2016 Document Reviewed: 05/25/2015 Elsevier Interactive Patient Education  2017 [place] Prevention in the Home Falls can cause injuries. They can happen to people of all ages. There are many things you can do to make your home safe and to help prevent falls. What can I do on the outside of my home? Regularly fix the edges of walkways and driveways and fix any cracks. Remove anything that might make you trip as you walk through a door, such as a raised step or threshold. Trim any bushes or trees on the path to your home. Use bright outdoor lighting. Clear any walking paths of anything that might make someone trip, such as rocks or tools. Regularly check to see if handrails are loose or broken. Make sure that both sides of any steps have handrails. Any raised decks and porches should have guardrails on the edges. Have any leaves, snow, or ice cleared regularly. Use sand or salt on walking paths during winter. Clean up any spills in your garage right away. This includes oil or grease spills. What can I do in the bathroom? Use night lights. Install grab bars by the toilet and in the tub and shower. Do not use towel bars as grab bars. Use non-skid mats or decals in the tub or shower. If you need to sit down in the shower, use a plastic, non-slip stool. Keep the floor dry. Clean up any water that spills on the floor as soon as it happens. Remove soap buildup in the tub or shower  regularly. Attach bath mats securely with double-sided non-slip rug tape. Do not have throw rugs and other things on the floor that can make you trip. What can I do in the bedroom? Use night lights. Make sure that you have a light by your bed that is easy to reach. Do not use any sheets or blankets that are too big for your bed. They should not hang down onto the floor. Have a firm chair that has side arms. You can use this for support while you get dressed. Do not have throw rugs and other things on the floor that can make you trip. What can I do in the kitchen? Clean up any spills right away. Avoid walking on wet floors. Keep items that you use a lot in easy-to-reach places. If you need to reach something above you, use a strong step stool that has a grab bar. Keep electrical cords out of the way. Do not use floor polish or wax that makes floors slippery. If you must use wax, use non-skid floor wax. Do not have throw rugs and other things on the floor that can make you trip. What can I do with my stairs? Do not leave any items on the stairs. Make sure that there are handrails on both sides of the stairs and use them. Fix handrails that are broken or loose. Make sure that handrails are as long as the stairways.  Check any carpeting to make sure that it is firmly attached to the stairs. Fix any carpet that is loose or worn. Avoid having throw rugs at the top or bottom of the stairs. If you do have throw rugs, attach them to the floor with carpet tape. Make sure that you have a light switch at the top of the stairs and the bottom of the stairs. If you do not have them, ask someone to add them for you. What else can I do to help prevent falls? Wear shoes that: Do not have high heels. Have rubber bottoms. Are comfortable and fit you well. Are closed at the toe. Do not wear sandals. If you use a stepladder: Make sure that it is fully opened. Do not climb a closed stepladder. Make sure that  both sides of the stepladder are locked into place. Ask someone to hold it for you, if possible. Clearly mark and make sure that you can see: Any grab bars or handrails. First and last steps. Where the edge of each step is. Use tools that help you move around (mobility aids) if they are needed. These include: Canes. Walkers. Scooters. Crutches. Turn on the lights when you go into a dark area. Replace any light bulbs as soon as they burn out. Set up your furniture so you have a clear path. Avoid moving your furniture around. If any of your floors are uneven, fix them. If there are any pets around you, be aware of where they are. Review your medicines with your doctor. Some medicines can make you feel dizzy. This can increase your chance of falling. Ask your doctor what other things that you can do to help prevent falls. This information is not intended to replace advice given to you by your health care provider. Make sure you discuss any questions you have with your health care provider. Document Released: 05/20/2009 Document Revised: 12/30/2015 Document Reviewed: 08/28/2014 Elsevier Interactive Patient Education  2017 [employer].

## 2021-08-15 ENCOUNTER — Telehealth: Payer: Self-pay | Admitting: *Deleted

## 2021-08-15 NOTE — Chronic Care Management (AMB) (Signed)
Chronic Care Management   Note  08/15/2021 Name: Kawon Willcutt MRN: [id number] DOB: 19-Jul-1946  Sherlock Nancarrow is a 76 y.o. year old male who is a primary care patient of Dennard Schaumann, Cammie Mcgee, MD. I reached out to Wylene Simmer by phone today in response to a referral sent by Mr. Spero Geralds PCP.  Mr. Pangborn was given information about Chronic Care Management services today including:  CCM service includes personalized support from designated clinical staff supervised by his physician, including individualized plan of care and coordination with other care providers 24/7 contact phone numbers for assistance for urgent and routine care needs. Service will only be billed when office clinical staff spend 20 minutes or more in a month to coordinate care. Only one practitioner may furnish and bill the service in a calendar month. The patient may stop CCM services at any time (effective at the end of the month) by phone call to the office staff. The patient is responsible for co-pay (up to 20% after annual deductible is met) if co-pay is required by the individual health plan.   Patient agreed to services and verbal consent obtained.   Follow up plan: Telephone appointment with care management team member scheduled for:08/29/21  [place]: [phone]

## 2021-08-15 NOTE — Chronic Care Management (AMB) (Signed)
°  Chronic Care Management   Outreach Note  08/15/2021 Name: Caleb Cuevas MRN: [id number] DOB: 1946/06/05  Caleb Cuevas is a 76 y.o. year old male who is a primary care patient of Tanya Nones, Priscille Heidelberg, MD. I reached out to Caleb Cuevas by phone today in response to a referral sent by Caleb Cuevas primary care provider.  A telephone outreach was attempted today. The patient was referred to the case management team for assistance with care management and care coordination.   Follow Up Plan: The care management team will reach out to the patient again over the next 7 days.  If patient returns call to provider office, please advise to call Embedded Care Management Care Guide Caleb Cuevas* at [phone].Caleb Cuevas  Care Guide, Embedded Care Coordination [facility] Management  Direct Dial: [phone]

## 2021-08-24 ENCOUNTER — Telehealth: Payer: Self-pay

## 2021-08-24 NOTE — Telephone Encounter (Signed)
° °  Telephone encounter was:  Successful.  08/24/2021 Name: Caleb Cuevas MRN: [id number] DOB: 06-Jun-1946  Caleb Cuevas is a 76 y.o. year old male who is a primary care patient of Pickard, Priscille Heidelberg, MD . The community resource team was consulted for assistance with  grab bars.  Care guide performed the following interventions: Spoke with patient and his wife about permission to place a referral through [facility] to Independent Living/Vocational rehab for grab bars.  Both patient and his wife gave their permission and I will make a note in the referral for them to contact Mrs. Manson Passey.  Follow Up Plan:  Care guide will follow up with patient by phone over the next 7-10 days  Glorianne Proctor, AAS Paralegal, [facility] Care Guide  Embedded Care Coordination [facility]   Care Management  [address] [place], [place] [address] [email or web address]   ?? [phone]   [email or web address]

## 2021-08-29 ENCOUNTER — Other Ambulatory Visit: Payer: Self-pay | Admitting: Family Medicine

## 2021-08-29 ENCOUNTER — Ambulatory Visit (INDEPENDENT_AMBULATORY_CARE_PROVIDER_SITE_OTHER): Payer: Medicare Other | Admitting: *Deleted

## 2021-08-29 DIAGNOSIS — I2510 Atherosclerotic heart disease of native coronary artery without angina pectoris: Secondary | ICD-10-CM

## 2021-08-29 DIAGNOSIS — E785 Hyperlipidemia, unspecified: Secondary | ICD-10-CM

## 2021-08-29 DIAGNOSIS — N183 Chronic kidney disease, stage 3 unspecified: Secondary | ICD-10-CM

## 2021-08-29 DIAGNOSIS — I480 Paroxysmal atrial fibrillation: Secondary | ICD-10-CM

## 2021-08-29 DIAGNOSIS — I10 Essential (primary) hypertension: Secondary | ICD-10-CM

## 2021-08-29 DIAGNOSIS — I5042 Chronic combined systolic (congestive) and diastolic (congestive) heart failure: Secondary | ICD-10-CM

## 2021-08-29 DIAGNOSIS — R5381 Other malaise: Secondary | ICD-10-CM

## 2021-08-29 DIAGNOSIS — I5030 Unspecified diastolic (congestive) heart failure: Secondary | ICD-10-CM

## 2021-08-29 DIAGNOSIS — E119 Type 2 diabetes mellitus without complications: Secondary | ICD-10-CM

## 2021-08-29 MED ORDER — CLOPIDOGREL BISULFATE 75 MG PO TABS: 75.0000 mg | ORAL_TABLET | Freq: Every day | ORAL | 3 refills | Status: DC

## 2021-08-29 MED ORDER — METOPROLOL SUCCINATE ER 50 MG PO TB24: 50.0000 mg | ORAL_TABLET | Freq: Two times a day (BID) | ORAL | 3 refills | Status: AC

## 2021-08-29 MED ORDER — OLMESARTAN MEDOXOMIL 20 MG PO TABS
10.0000 mg | ORAL_TABLET | Freq: Every day | ORAL | 3 refills | Status: DC
Start: 2021-08-29 — End: 2022-03-09

## 2021-08-29 MED ORDER — APIXABAN 5 MG PO TABS: 5.0000 mg | ORAL_TABLET | Freq: Two times a day (BID) | ORAL | 3 refills | Status: AC

## 2021-08-29 NOTE — Chronic Care Management (AMB) (Signed)
Chronic Care Management   CCM RN Visit Note  08/29/2021 Name: Caleb Cuevas MRN: [id number] DOB: 08-Dec-1945  Subjective: Caleb Cuevas is a 76 y.o. year old male who is a primary care patient of Pickard, Cammie Mcgee, MD. The care management team was consulted for assistance with disease management and care coordination needs.    Engaged with patient by telephone for initial visit in response to provider referral for case management and/or care coordination services.   Consent to Services:  The patient was given the following information about Chronic Care Management services today, agreed to services, and gave verbal consent: 1. CCM service includes personalized support from designated clinical staff supervised by the primary care provider, including individualized plan of care and coordination with other care providers 2. 24/7 contact phone numbers for assistance for urgent and routine care needs. 3. Service will only be billed when office clinical staff spend 20 minutes or more in a month to coordinate care. 4. Only one practitioner may furnish and bill the service in a calendar month. 5.The patient may stop CCM services at any time (effective at the end of the month) by phone call to the office staff. 6. The patient will be responsible for cost sharing (co-pay) of up to 20% of the service fee (after annual deductible is met). Patient agreed to services and consent obtained.  Patient agreed to services and verbal consent obtained.   Assessment: Review of patient past medical history, allergies, medications, health status, including review of consultants reports, laboratory and other test data, was performed as part of comprehensive evaluation and provision of chronic care management services.   SDOH (Social Determinants of Health) assessments and interventions performed:   Food Insecurity: No Food Insecurity   Worried About Charity fundraiser in the Last Year: Never true   Ran Out of Food in the  Last Year: Never true    Transportation Needs: No Transportation Needs   Lack of Transportation (Medical): No   Lack of Transportation (Non-Medical): No    CCM Care Plan  No Known Allergies  Outpatient Encounter Medications as of 08/29/2021  Medication Sig   albuterol (VENTOLIN HFA) 108 (90 Base) MCG/ACT inhaler Inhale 2 puffs into the lungs every 6 (six) hours as needed for wheezing or shortness of breath.   apixaban (ELIQUIS) 5 MG TABS tablet Take 1 tablet (5 mg total) by mouth 2 (two) times daily.   Cholecalciferol (VITAMIN D3) 125 MCG (5000 UT) CAPS Take 1 capsule (5,000 Units total) by mouth daily.   clopidogrel (PLAVIX) 75 MG tablet Take 1 tablet (75 mg total) by mouth daily.   DULoxetine (CYMBALTA) 60 MG capsule Take 1 capsule (60 mg total) by mouth 2 (two) times daily.   fluticasone-salmeterol (ADVAIR) 250-50 MCG/ACT AEPB Inhale 1 puff into the lungs in the morning and at bedtime.   glucose 4 GM chewable tablet Chew 1 tablet (4 g total) by mouth as needed for low blood sugar.   insulin aspart (NOVOLOG) 100 UNIT/ML FlexPen Inject 13-15 Units into the skin See admin instructions. 13 AM -13 Lunch -15 Dinner   Insulin Glargine-yfgn 100 UNIT/ML SOPN Inject 50 Units into the skin at bedtime.   Insulin Pen Needle (BD PEN NEEDLE NANO U/F) 32G X 4 MM MISC USE AS DIRECTED TO INJECT VICTOZA SQ QD   metoprolol succinate (TOPROL-XL) 50 MG 24 hr tablet Take 1 tablet (50 mg total) by mouth 2 (two) times daily. Take with or immediately following a meal.   Multiple  Vitamins-Minerals (MULTI-VITAMIN/MINERALS PO) Take 1 tablet by mouth daily.   olmesartan (BENICAR) 20 MG tablet Take 10 mg by mouth daily.   Tiotropium Bromide Monohydrate (SPIRIVA RESPIMAT) 2.5 MCG/ACT AERS Inhale 2 puffs into the lungs daily.   isosorbide mononitrate (IMDUR) 30 MG 24 hr tablet Take 1 tablet (30 mg total) by mouth daily. Please schedule cardiology office visit for further refills. (Patient not taking: Reported on  08/29/2021)   pravastatin (PRAVACHOL) 80 MG tablet Take 80 mg by mouth at bedtime. (Patient not taking: Reported on 08/29/2021)   No facility-administered encounter medications on file as of 08/29/2021.    Patient Active Problem List   Diagnosis Date Noted   Chronic combined systolic and diastolic HF (heart failure) ([place]) 07/21/2021   Coronary artery disease involving native coronary artery of native heart without angina pectoris 38/18/2993   Diastolic CHF, acute ([place]) 02/14/2021   A-fib ([place]) 02/14/2021   Atrial flutter ([place]) 02/14/2021   Hypoxia 02/14/2021   OSA (obstructive sleep apnea) 02/14/2021   Acute respiratory failure with hypoxia ([place]) 02/14/2021   Respiratory arrest ([place])    Acute pulmonary edema ([place]) 02/13/2021   Narrow complex tachycardia ([place]) 02/13/2021   CKD (chronic kidney disease) stage 3, GFR 30-59 ml/min ([place]) 02/13/2021   New onset of congestive heart failure ([place]) 02/13/2021   Peripheral arterial disease ([place]) 01/02/2020   TIA (transient ischemic attack) 06/18/2019   Polycythemia, 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   Leukocytosis 08/24/2018   Nonproliferative diabetic retinopathy ([place])    Unstable angina ([place]) 12/25/2017   Chest pain 12/24/2017   Diabetes mellitus type 2 in nonobese ([place]) 12/24/2017   Essential hypertension 12/24/2017   HLD (hyperlipidemia) 12/24/2017    Conditions to be addressed/monitored:CHF and DMII  Care Plan : RN Care Manager Plan of Care  Updates made by Caleb Mends, RN since 08/29/2021 12:00 AM     Problem: No plan of care established for management of chronic disease states (CHF, DM2, CAD, HTN)   Priority: High     Long-Range Goal: Development of plan of care for chronic disease management (CHF, DM2, CAD, HTN)   Start Date: 08/29/2021  Expected End Date: 02/25/2022  Priority: High  Note:   Current Barriers:  Knowledge Deficits related to plan of care for management of CHF and DMII  Spoke with patient and he prefers RN care manager  speak with his wife Caleb Cuevas and friend Caleb Cuevas who lives nearby and stops in to assist.  Caleb Cuevas reports she is primary caregiver as pt requires assistance with bathing, dressing and most all ADL's, has walker and cane that uses most of the time, unable to walk independently, feels like has no energy and gets very tired easily, feels that outpatient rehab may be a good option for pt and not interested in home health PT due to not wanting so many people coming into the home.  Mrs. Nuon reports pt does not have a scale and does not weigh and she will check on obtaining a scale, has blood pressure cuff and checks 1-2 x per week, checks CBG once daily with readings 125-135, reports pt drinks adequate water.  Pt is currently wearing a heart monitor.  RNCM Clinical Goal(s):  Patient will verbalize understanding of plan for management of CHF and DMII as evidenced by patient, CG report, review of EHR and  through collaboration with RN Care manager, provider, and care team.   Interventions: 1:1 collaboration with primary care provider regarding development and update of comprehensive plan of  care as evidenced by provider attestation and co-signature Inter-disciplinary care team collaboration (see longitudinal plan of care) Evaluation of current treatment plan related to  self management and patient's adherence to plan as established by provider   Heart Failure Interventions:  (Status:  New goal. and Goal on track:  Yes.) Long Term Goal Basic overview and discussion of pathophysiology of Heart Failure reviewed Provided education on low sodium diet Reviewed Heart Failure Action Plan in depth and provided written copy Assessed need for readable accurate scales in home Provided education about placing scale on hard, flat surface Advised patient to weigh each morning after emptying bladder Discussed importance of daily weight and advised patient to weigh and record daily Discussed the importance of  keeping all appointments with provider Screening for signs and symptoms of depression related to chronic disease state  Assessed social determinant of health barriers   Diabetes Interventions:  (Status:  New goal. and Goal on track:  Yes.) Long Term Goal Assessed patient's understanding of A1c goal: <7% Provided education to patient about basic DM disease process Reviewed medications with patient and discussed importance of medication adherence Discussed plans with patient for ongoing care management follow up and provided patient with direct contact information for care management team Provided patient with written educational materials related to hypo and hyperglycemia and importance of correct treatment Review of patient status, including review of consultants reports, relevant laboratory and other test results, and medications completed Pain assessment completed Safety precautions reviewed In basket message sent to Dr. Dennard Schaumann reporting pt, family is requesting outpatient physical therapy Lab Results  Component Value Date   HGBA1C 6.7 (H) 02/25/2021     Patient Goals/Self-Care Activities: Take medications as prescribed   Attend all scheduled provider appointments Call pharmacy for medication refills 3-7 days in advance of running out of medications Call provider office for new concerns or questions  call office if I gain more than 2 pounds in one day or 5 pounds in one week use salt in moderation watch for swelling in feet, ankles and legs every day weigh myself daily develop a rescue plan check blood sugar at prescribed times: once daily enter blood sugar readings and medication or insulin into daily log take the blood sugar log to all doctor visits take the blood sugar meter to all doctor visits Please obtain scales and begin weighing daily Continue checking blood pressure several times weekly Look over education sent via My Chart- Heart Failure Action plan, Low sodium diet  and hypoglycemia Read food labels for sodium content Message sent to primary doctor about outpatient rehab- please follow up with your doctor       Plan:Telephone follow up appointment with care management team member scheduled for:  10/03/21  Jacqlyn Larsen [facility], BSN RN Case Manager [facility] [phone]

## 2021-08-29 NOTE — Patient Instructions (Signed)
Visit Information   Thank you for taking time to visit with me today. Please don't hesitate to contact me if I can be of assistance to you before our next scheduled telephone appointment.  Following are the goals we discussed today:  Take medications as prescribed   Attend all scheduled provider appointments Call pharmacy for medication refills 3-7 days in advance of running out of medications Call provider office for new concerns or questions  call office if I gain more than 2 pounds in one day or 5 pounds in one week use salt in moderation watch for swelling in feet, ankles and legs every day weigh myself daily develop a rescue plan check blood sugar at prescribed times: once daily enter blood sugar readings and medication or insulin into daily log take the blood sugar log to all doctor visits take the blood sugar meter to all doctor visits Please obtain scales and begin weighing daily Continue checking blood pressure several times weekly Look over education sent via My Chart- Heart Failure Action plan, Low sodium diet and hypoglycemia Read food labels for sodium content Message sent to primary doctor about outpatient rehab- please follow up with your doctor  Our next appointment is by telephone on 10/03/21 at 3 pm  Please call the care guide team at [phone] if you need to cancel or reschedule your appointment.   If you are experiencing a Mental Health or [place] or need someone to talk to, please call the [place] National Suicide Prevention Lifeline: 519-234-0455 or TTY: (660)711-4813 TTY (952)693-7146) to talk to a trained counselor call 1-800-273-TALK (toll free, 24 hour hotline) go to [facility] [address], [place] [phone]) call 911   Following is a copy of your full care plan:  Care Plan : [place] of Care  Updates made by Caleb Mends, RN since 08/29/2021 12:00 AM     Problem: No plan of  care established for management of chronic disease states (CHF, DM2, CAD, HTN)   Priority: High     Long-Range Goal: Development of plan of care for chronic disease management (CHF, DM2, CAD, HTN)   Start Date: 08/29/2021  Expected End Date: 02/25/2022  Priority: High  Note:   Current Barriers:  Knowledge Deficits related to plan of care for management of CHF and DMII  Spoke with patient and he prefers RN care manager speak with his wife Caleb Cuevas and friend Caleb Cuevas who lives nearby and stops in to assist.  Caleb Cuevas reports she is primary caregiver as pt requires assistance with bathing, dressing and most all ADL's, has walker and cane that uses most of the time, unable to walk independently, feels like has no energy and gets very tired easily, feels that outpatient rehab may be a good option for pt and not interested in home health PT due to not wanting so many people coming into the home.  Caleb Cuevas reports pt does not have a scale and does not weigh and she will check on obtaining a scale, has blood pressure cuff and checks 1-2 x per week, checks CBG once daily with readings 125-135, reports pt drinks adequate water.  Pt is currently wearing a heart monitor.  RNCM Clinical Goal(s):  Patient will verbalize understanding of plan for management of CHF and DMII as evidenced by patient, CG report, review of EHR and  through collaboration with RN Care manager, provider, and care team.   Interventions: 1:1 collaboration with primary  care provider regarding development and update of comprehensive plan of care as evidenced by provider attestation and co-signature Inter-disciplinary care team collaboration (see longitudinal plan of care) Evaluation of current treatment plan related to  self management and patient's adherence to plan as established by provider   Heart Failure Interventions:  (Status:  New goal. and Goal on track:  Yes.) Long Term Goal Basic overview and discussion of  pathophysiology of Heart Failure reviewed Provided education on low sodium diet Reviewed Heart Failure Action Plan in depth and provided written copy Assessed need for readable accurate scales in home Provided education about placing scale on hard, flat surface Advised patient to weigh each morning after emptying bladder Discussed importance of daily weight and advised patient to weigh and record daily Discussed the importance of keeping all appointments with provider Screening for signs and symptoms of depression related to chronic disease state  Assessed social determinant of health barriers   Diabetes Interventions:  (Status:  New goal. and Goal on track:  Yes.) Long Term Goal Assessed patient's understanding of A1c goal: <7% Provided education to patient about basic DM disease process Reviewed medications with patient and discussed importance of medication adherence Discussed plans with patient for ongoing care management follow up and provided patient with direct contact information for care management team Provided patient with written educational materials related to hypo and hyperglycemia and importance of correct treatment Review of patient status, including review of consultants reports, relevant laboratory and other test results, and medications completed Pain assessment completed Safety precautions reviewed In basket message sent to Caleb Cuevas reporting pt, family is requesting outpatient physical therapy Lab Results  Component Value Date   HGBA1C 6.7 (H) 02/25/2021     Patient Goals/Self-Care Activities: Take medications as prescribed   Attend all scheduled provider appointments Call pharmacy for medication refills 3-7 days in advance of running out of medications Call provider office for new concerns or questions  call office if I gain more than 2 pounds in one day or 5 pounds in one week use salt in moderation watch for swelling in feet, ankles and legs every  day weigh myself daily develop a rescue plan check blood sugar at prescribed times: once daily enter blood sugar readings and medication or insulin into daily log take the blood sugar log to all doctor visits take the blood sugar meter to all doctor visits Please obtain scales and begin weighing daily Continue checking blood pressure several times weekly Look over education sent via My Chart- Heart Failure Action plan, Low sodium diet and hypoglycemia Read food labels for sodium content Message sent to primary doctor about outpatient rehab- please follow up with your doctor       Consent to CCM Services: Caleb Cuevas was given information about Chronic Care Management services including:  CCM service includes personalized support from designated clinical staff supervised by his physician, including individualized plan of care and coordination with other care providers 24/7 contact phone numbers for assistance for urgent and routine care needs. Service will only be billed when office clinical staff spend 20 minutes or more in a month to coordinate care. Only one practitioner may furnish and bill the service in a calendar month. The patient may stop CCM services at any time (effective at the end of the month) by phone call to the office staff. The patient will be responsible for cost sharing (co-pay) of up to 20% of the service fee (after annual deductible is met).  Patient agreed to  services and verbal consent obtained.   Patient verbalizes understanding of instructions and care plan provided today and agrees to view in [place]. Active MyChart status confirmed with patient.    Telephone follow up appointment with care management team member scheduled for:  10/03/21 Low-Sodium Eating Plan Sodium, which is an element that makes up salt, helps you maintain a healthy balance of fluids in your body. Too much sodium can increase your blood pressure and cause fluid and waste to be held in your  body. Your health care provider or dietitian may recommend following this plan if you have high blood pressure (hypertension), kidney disease, liver disease, or heart failure. Eating less sodium can help lower your blood pressure, reduce swelling, and protect your heart, liver, and kidneys. What are tips for following this plan? Reading food labels The Nutrition Facts label lists the amount of sodium in one serving of the food. If you eat more than one serving, you must multiply the listed amount of sodium by the number of servings. Choose foods with less than 140 mg of sodium per serving. Avoid foods with 300 mg of sodium or more per serving. Shopping  Look for lower-sodium products, often labeled as "low-sodium" or "no salt added." Always check the sodium content, even if foods are labeled as "unsalted" or "no salt added." Buy fresh foods. Avoid canned foods and pre-made or frozen meals. Avoid canned, cured, or processed meats. Buy breads that have less than 80 mg of sodium per slice. Cooking  Eat more home-cooked food and less restaurant, buffet, and fast food. Avoid adding salt when cooking. Use salt-free seasonings or herbs instead of table salt or sea salt. Check with your health care provider or pharmacist before using salt substitutes. Cook with plant-based oils, such as canola, sunflower, or olive oil. Meal planning When eating at a restaurant, ask that your food be prepared with less salt or no salt, if possible. Avoid dishes labeled as brined, pickled, cured, smoked, or made with soy sauce, miso, or teriyaki sauce. Avoid foods that contain MSG (monosodium glutamate). MSG is sometimes added to [place] food, bouillon, and some canned foods. Make meals that can be grilled, baked, poached, roasted, or steamed. These are generally made with less sodium. General information Most people on this plan should limit their sodium intake to 1,500-2,000 mg (milligrams) of sodium each day. What  foods should I eat? Fruits Fresh, frozen, or canned fruit. Fruit juice. Vegetables Fresh or frozen vegetables. "No salt added" canned vegetables. "No salt added" tomato sauce and paste. Low-sodium or reduced-sodium tomato and vegetable juice. Grains Low-sodium cereals, including oats, puffed wheat and rice, and shredded wheat. Low-sodium crackers. Unsalted rice. Unsalted pasta. Low-sodium bread. Whole-grain breads and whole-grain pasta. Meats and other proteins Fresh or frozen (no salt added) meat, poultry, seafood, and fish. Low-sodium canned tuna and salmon. Unsalted nuts. Dried peas, beans, and lentils without added salt. Unsalted canned beans. Eggs. Unsalted nut butters. Dairy Milk. Soy milk. Cheese that is naturally low in sodium, such as ricotta cheese, fresh mozzarella, or Swiss cheese. Low-sodium or reduced-sodium cheese. Cream cheese. Yogurt. Seasonings and condiments Fresh and dried herbs and spices. Salt-free seasonings. Low-sodium mustard and ketchup. Sodium-free salad dressing. Sodium-free light mayonnaise. Fresh or refrigerated horseradish. Lemon juice. Vinegar. Other foods Homemade, reduced-sodium, or low-sodium soups. Unsalted popcorn and pretzels. Low-salt or salt-free chips. The items listed above may not be a complete list of foods and beverages you can eat. Contact a dietitian for more information. What foods  should I avoid? Vegetables Sauerkraut, pickled vegetables, and relishes. Olives. [place] fries. Onion rings. Regular canned vegetables (not low-sodium or reduced-sodium). Regular canned tomato sauce and paste (not low-sodium or reduced-sodium). Regular tomato and vegetable juice (not low-sodium or reduced-sodium). Frozen vegetables in sauces. Grains Instant hot cereals. Bread stuffing, pancake, and biscuit mixes. Croutons. Seasoned rice or pasta mixes. Noodle soup cups. Boxed or frozen macaroni and cheese. Regular salted crackers. Self-rising flour. Meats and other  proteins Meat or fish that is salted, canned, smoked, spiced, or pickled. Precooked or cured meat, such as sausages or meat loaves. Berniece Salines. Ham. Pepperoni. Hot dogs. Corned beef. Chipped beef. Salt pork. Jerky. Pickled herring. Anchovies and sardines. Regular canned tuna. Salted nuts. Dairy Processed cheese and cheese spreads. Hard cheeses. Cheese curds. Blue cheese. Feta cheese. String cheese. Regular cottage cheese. Buttermilk. Canned milk. Fats and oils Salted butter. Regular margarine. Ghee. Bacon fat. Seasonings and condiments Onion salt, garlic salt, seasoned salt, table salt, and sea salt. Canned and packaged gravies. Worcestershire sauce. Tartar sauce. Barbecue sauce. Teriyaki sauce. Soy sauce, including reduced-sodium. Steak sauce. Fish sauce. Oyster sauce. Cocktail sauce. Horseradish that you find on the shelf. Regular ketchup and mustard. Meat flavorings and tenderizers. Bouillon cubes. Hot sauce. Pre-made or packaged marinades. Pre-made or packaged taco seasonings. Relishes. Regular salad dressings. Salsa. Other foods Salted popcorn and pretzels. Corn chips and puffs. Potato and tortilla chips. Canned or dried soups. Pizza. Frozen entrees and pot pies. The items listed above may not be a complete list of foods and beverages you should avoid. Contact a dietitian for more information. Summary Eating less sodium can help lower your blood pressure, reduce swelling, and protect your heart, liver, and kidneys. Most people on this plan should limit their sodium intake to 1,500-2,000 mg (milligrams) of sodium each day. Canned, boxed, and frozen foods are high in sodium. Restaurant foods, fast foods, and pizza are also very high in sodium. You also get sodium by adding salt to food. Try to cook at home, eat more fresh fruits and vegetables, and eat less fast food and canned, processed, or prepared foods. This information is not intended to replace advice given to you by your health care provider.  Make sure you discuss any questions you have with your health care provider. Document Revised: 08/29/2019 Document Reviewed: 06/25/2019 Elsevier Patient Education  2022 [place]. Hypoglycemia Hypoglycemia is when the sugar (glucose) level in your blood is too low. Low blood sugar can happen to people who have diabetes and people who do not have diabetes. Low blood sugar can happen quickly, and it can be an emergency. What are the causes? This condition happens most often in people who have diabetes. It may be caused by: Diabetes medicine. Not eating enough, or not eating often enough. Doing more physical activity. Drinking alcohol on an empty stomach. If you do not have diabetes, this condition may be caused by: A tumor in the pancreas. Not eating enough, or not eating for long periods at a time (fasting). A very bad infection or illness. Problems after having weight loss (bariatric) surgery. Kidney failure or liver failure. Certain medicines. What increases the risk? This condition is more likely to develop in people who: Have diabetes and take medicines to lower their blood sugar. Abuse alcohol. Have a very bad illness. What are the signs or symptoms? Mild Hunger. Sweating and feeling clammy. Feeling dizzy or light-headed. Being sleepy or having trouble sleeping. Feeling like you may vomit (nauseous). A fast heartbeat. A headache.  Blurry vision. Mood changes, such as: Being grouchy. Feeling worried or nervous (anxious). Tingling or loss of feeling (numbness) around your mouth, lips, or tongue. Moderate Confusion and poor judgment. Behavior changes. Weakness. Uneven heartbeat. Trouble with moving (coordination). Very low Very low blood sugar (severe hypoglycemia) is a medical emergency. It can cause: Fainting. Seizures. Loss of consciousness (coma). Death. How is this treated? Treating low blood sugar Low blood sugar is often treated by eating or drinking  something that has sugar in it right away. The food or drink should contain 15 grams of a fast-acting carb (carbohydrate). Options include: 4 oz (120 mL) of fruit juice. 4 oz (120 Heart Failure Action Plan A heart failure action plan helps you understand what to do when you have symptoms of heart failure. Your action plan is a color-coded plan that lists the symptoms to watch for and indicates what actions to take. If you have symptoms in the red zone, you need medical care right away. If you have symptoms in the yellow zone, you are having problems. If you have symptoms in the green zone, you are doing well. Follow the plan that was created by you and your health care provider. Review your plan each time you visit your health care provider. Red zone These signs and symptoms mean you should get medical help right away: You have trouble breathing when resting. You have a dry cough that is getting worse. You have swelling or pain in your legs or abdomen that is getting worse. You suddenly gain more than 2-3 lb (0.9-1.4 kg) in 24 hours, or more than 5 lb (2.3 kg) in a week. This amount may be more or less depending on your condition. You have trouble staying awake or you feel confused. You have chest pain. You do not have an appetite. You pass out. You have worsening sadness or depression. If you have any of these symptoms, call your local emergency services (911 in the [place] right away. Do not drive yourself to the hospital. Yellow zone These signs and symptoms mean your condition may be getting worse and you should make some changes: You have trouble breathing when you are active, or you need to sleep with your head raised on extra pillows to help you breathe. You have swelling in your legs or abdomen. You gain 2-3 lb (0.9-1.4 kg) in 24 hours, or 5 lb (2.3 kg) in a week. This amount may be more or less depending on your condition. You get tired easily. You have trouble sleeping. You have a  dry cough. If you have any of these symptoms: Contact your health care provider within the next day. Your health care provider may adjust your medicines. Green zone These signs mean you are doing well and can continue what you are doing: You do not have shortness of breath. You have very little swelling or no new swelling. Your weight is stable (no gain or loss). You have a normal activity level. You do not have chest pain or any other new symptoms. Follow these instructions at home: Take over-the-counter and prescription medicines only as told by your health care provider. Weigh yourself daily. Your target weight is __________ lb (__________ kg). Call your health care provider if you gain more than __________ lb (__________ kg) in 24 hours, or more than __________ lb (__________ kg) in a week. Health care provider name: _____________________________________________________ Health care provider phone number: _____________________________________________________ Eat a heart-healthy diet. Work with a diet and nutrition specialist (dietitian) to  create an eating plan that is best for you. Keep all follow-up visits. This is important. Where to find more information American Heart Association: www.heart.org Summary A heart failure action plan helps you understand what to do when you have symptoms of heart failure. Follow the action plan that was created by you and your health care provider. Get help right away if you have any symptoms in the red zone. This information is not intended to replace advice given to you by your health care provider. Make sure you discuss any questions you have with your health care provider. Document Revised: 03/08/2020 Document Reviewed: 03/08/2020 Elsevier Patient Education  2022 [employer].

## 2021-08-29 NOTE — Telephone Encounter (Signed)
Patient's spouse came to the office to request  refills for the following meds:  olmesartan (BENICAR) 20 MG tablet [[id number]]   clopidogrel (PLAVIX) 75 MG tablet [[id number]]   metoprolol succinate (TOPROL-XL) 50 MG 24 hr tablet [[id number]]   apixaban (ELIQUIS) 5 MG TABS tablet [[id number]]    Pharmacy confirmed as  [facility] - [place], [place] - [address] [facility]  [address] [place] [address]  Phone:  [phone]  Fax:  [phone]    Please advise at [phone].

## 2021-09-01 ENCOUNTER — Telehealth: Payer: Self-pay

## 2021-09-01 NOTE — Telephone Encounter (Signed)
° °  Telephone encounter was:  Successful.  09/01/2021 Name: Caleb Cuevas MRN: [id number] DOB: May 26, 1946  Caleb Cuevas is a 76 y.o. year old male who is a primary care patient of Pickard, Priscille Heidelberg, MD . The community resource team was consulted for assistance with Home Modifications  Care guide performed the following interventions: Received [id number] message from Narda Bonds at [facility]. 08/30/21 Today called and spoke with [name] wife due to him being asleep, they are in need of rails in bathroom to prevent him from falling.  I scheduled appointment for Feb. 1st at 2:30pm with Independent Living counselor, Sue Lush to explain the process for Independent Living.  Follow Up Plan:  No further follow up planned at this time. The patient has been provided with needed resources.  Everlie Eble, AAS Paralegal, [facility] Care Guide  Embedded Care Coordination [facility]   Care Management  [address] [place], [place] [address] ??[email or web address]   ?? [phone]   [email or web address]

## 2021-09-06 ENCOUNTER — Ambulatory Visit: Payer: Medicare Other | Admitting: Adult Health

## 2021-09-06 ENCOUNTER — Telehealth: Payer: Self-pay | Admitting: Adult Health

## 2021-09-06 ENCOUNTER — Encounter: Payer: Self-pay | Admitting: Adult Health

## 2021-09-06 VITALS — BP 116/64 | HR 70 | Ht 72.0 in | Wt 209.0 lb

## 2021-09-06 DIAGNOSIS — R2689 Other abnormalities of gait and mobility: Secondary | ICD-10-CM

## 2021-09-06 DIAGNOSIS — M542 Cervicalgia: Secondary | ICD-10-CM

## 2021-09-06 DIAGNOSIS — E119 Type 2 diabetes mellitus without complications: Secondary | ICD-10-CM | POA: Diagnosis not present

## 2021-09-06 DIAGNOSIS — I5030 Unspecified diastolic (congestive) heart failure: Secondary | ICD-10-CM | POA: Diagnosis not present

## 2021-09-06 DIAGNOSIS — G459 Transient cerebral ischemic attack, unspecified: Secondary | ICD-10-CM

## 2021-09-06 DIAGNOSIS — M5412 Radiculopathy, cervical region: Secondary | ICD-10-CM

## 2021-09-06 DIAGNOSIS — I671 Cerebral aneurysm, nonruptured: Secondary | ICD-10-CM

## 2021-09-06 NOTE — Telephone Encounter (Signed)
[employer] medicare order sent to [facility], [facility] they will reach out to the patient to schedule.  
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
## 2021-09-06 NOTE — Patient Instructions (Signed)
Continue Eliquis (apixaban) daily  and pravastatin  for secondary stroke prevention  Please speak with your cardiologist regarding ongoing use of Plavix - this is not needed from a stroke standpoint but may be needed from a cardiology standpoint   You will be called to schedule an imaging of your neck and head vessels - this will look at your aneurysm to ensure no worsening and to ensure no neck issues that could be causing your imbalance, neck pain and right arm symptoms   Continue to follow up with PCP regarding cholesterol, blood pressure and diabetes management  Maintain strict control of hypertension with blood pressure goal below 130/90, diabetes with hemoglobin A1c goal below 7.0 % and cholesterol with LDL cholesterol (bad cholesterol) goal below 70 mg/dL.   Signs of a Stroke? Follow the BEFAST method:  Balance Watch for a sudden loss of balance, trouble with coordination or vertigo Eyes Is there a sudden loss of vision in one or both eyes? Or double vision?  Face: Ask the person to smile. Does one side of the face droop or is it numb?  Arms: Ask the person to raise both arms. Does one arm drift downward? Is there weakness or numbness of a leg? Speech: Ask the person to repeat a simple phrase. Does the speech sound slurred/strange? Is the person confused ? Time: If you observe any of these signs, call [phone].       Thank you for coming to see [place] at [facility]. I hope we have been able to provide you high quality care today.  You may receive a patient satisfaction survey over the next few weeks. We would appreciate your feedback and comments so that we may continue to improve ourselves and the health of our patients.

## 2021-09-07 NOTE — Progress Notes (Signed)
I agree with the above plan 

## 2021-09-14 ENCOUNTER — Encounter: Payer: Self-pay | Admitting: *Deleted

## 2021-09-19 ENCOUNTER — Ambulatory Visit: Payer: Medicare Other

## 2021-09-20 ENCOUNTER — Ambulatory Visit (INDEPENDENT_AMBULATORY_CARE_PROVIDER_SITE_OTHER): Payer: Medicare Other | Admitting: Nurse Practitioner

## 2021-09-20 ENCOUNTER — Other Ambulatory Visit: Payer: Self-pay

## 2021-09-20 ENCOUNTER — Encounter: Payer: Self-pay | Admitting: Physician Assistant

## 2021-09-20 VITALS — BP 106/59 | HR 69 | Ht 72.0 in | Wt 208.6 lb

## 2021-09-20 DIAGNOSIS — I480 Paroxysmal atrial fibrillation: Secondary | ICD-10-CM

## 2021-09-20 DIAGNOSIS — G4733 Obstructive sleep apnea (adult) (pediatric): Secondary | ICD-10-CM

## 2021-09-20 DIAGNOSIS — N183 Chronic kidney disease, stage 3 unspecified: Secondary | ICD-10-CM | POA: Diagnosis not present

## 2021-09-20 DIAGNOSIS — E785 Hyperlipidemia, unspecified: Secondary | ICD-10-CM | POA: Diagnosis not present

## 2021-09-20 DIAGNOSIS — I5042 Chronic combined systolic (congestive) and diastolic (congestive) heart failure: Secondary | ICD-10-CM

## 2021-09-20 DIAGNOSIS — I10 Essential (primary) hypertension: Secondary | ICD-10-CM

## 2021-09-20 DIAGNOSIS — R296 Repeated falls: Secondary | ICD-10-CM | POA: Diagnosis not present

## 2021-09-20 DIAGNOSIS — I4729 Other ventricular tachycardia: Secondary | ICD-10-CM | POA: Diagnosis not present

## 2021-09-20 DIAGNOSIS — I471 Supraventricular tachycardia: Secondary | ICD-10-CM

## 2021-09-20 DIAGNOSIS — I2510 Atherosclerotic heart disease of native coronary artery without angina pectoris: Secondary | ICD-10-CM
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
## 2021-09-20 NOTE — Patient Instructions (Signed)
Medication Instructions:  Your physician recommends that you continue on your current medications as directed. Please refer to the Current Medication list given to you today.   *If you need a refill on your cardiac medications before your next appointment, please call your pharmacy*   Lab Work: Your physician recommends that you return for fasting lab work in 1-2 weeks   hfp Lipid  If you have labs (blood work) drawn today and your tests are completely normal, you will receive your results only by: [place] (if you have MyChart) OR A paper copy in the mail If y ou have any lab test that is abnormal or we need to change your treatment, we will call you to review the results.   Testing/Procedures: NONE ordered at this time of appointment   Follow-Up: At [facility], you and your health needs are our priority.  As part of our continuing mission to provide you with exceptional heart care, we have created designated Provider Care Teams.  These Care Teams include your primary Cardiologist (physician) and Advanced Practice Providers (APPs -  Physician Assistants and Nurse Practitioners) who all work together to provide you with the care you need, when you need it.  We recommend signing up for the patient portal called "MyChart".  Sign up information is provided on this After Visit Summary.  MyChart is used to connect with patients for Virtual Visits (Telemedicine).  Patients are able to view lab/test results, encounter notes, upcoming appointments, etc.  Non-urgent messages can be sent to your provider as well.   To learn more about what you can do with MyChart, go to [email or web address].    Your next appointment:   3 month(s)  The format for your next appointment:   In Person  Provider:   Minus Breeding, MD  or Almyra Deforest, PA-C        Other Instructions

## 2021-09-21 ENCOUNTER — Other Ambulatory Visit (HOSPITAL_COMMUNITY): Payer: Self-pay

## 2021-09-22 ENCOUNTER — Telehealth: Payer: Self-pay

## 2021-09-22 DIAGNOSIS — E119 Type 2 diabetes mellitus without complications: Secondary | ICD-10-CM

## 2021-09-22 MED ORDER — BLOOD GLUCOSE MONITOR KIT: PACK | 0 refills | Status: DC

## 2021-09-22 NOTE — Telephone Encounter (Signed)
Pt called in stating that he needs a new prescription for a glucose monitor to check his blood sugar. Please advise.  Cb#: [phone]

## 2021-09-22 NOTE — Telephone Encounter (Signed)
Rx for new meter sent to pharmacy. 

## 2021-09-23 ENCOUNTER — Ambulatory Visit
Admission: RE | Admit: 2021-09-23 | Discharge: 2021-09-23 | Disposition: A | Discharge status: Home / Self Care | Payer: Medicare Other | Source: Ambulatory Visit | Attending: Adult Health | Admitting: Adult Health

## 2021-09-23 DIAGNOSIS — M542 Cervicalgia: Secondary | ICD-10-CM | POA: Diagnosis not present

## 2021-09-23 DIAGNOSIS — R2689 Other abnormalities of gait and mobility: Secondary | ICD-10-CM | POA: Diagnosis not present

## 2021-09-23 DIAGNOSIS — I671 Cerebral aneurysm, nonruptured: Secondary | ICD-10-CM | POA: Diagnosis not present

## 2021-09-23 DIAGNOSIS — M5412 Radiculopathy, cervical region: Secondary | ICD-10-CM

## 2021-09-26 ENCOUNTER — Other Ambulatory Visit: Payer: Self-pay | Admitting: Adult Health

## 2021-09-26 ENCOUNTER — Telehealth: Payer: Self-pay

## 2021-09-26 ENCOUNTER — Telehealth: Payer: Self-pay | Admitting: Adult Health

## 2021-09-26 DIAGNOSIS — M5412 Radiculopathy, cervical region: Secondary | ICD-10-CM

## 2021-09-26 DIAGNOSIS — R2689 Other abnormalities of gait and mobility: Secondary | ICD-10-CM

## 2021-09-26 DIAGNOSIS — M542 Cervicalgia: Secondary | ICD-10-CM

## 2021-09-26 NOTE — Telephone Encounter (Signed)
[facility] ph # [phone]

## 2021-09-26 NOTE — Telephone Encounter (Signed)
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

## 2021-09-26 NOTE — Telephone Encounter (Signed)
Pt returned phone call, would like a call back.  

## 2021-09-26 NOTE — Telephone Encounter (Signed)
I called pt. No answer, left a message asking pt to call me back.   

## 2021-09-26 NOTE — Telephone Encounter (Signed)
I called the pt back and relayed information. Pt verbalized understanding and the agreement to the plan.   Pt will plan to f/u with [facility], but if issues with referral and [facility] insurance comes up he will let us know.

## 2021-09-29 DIAGNOSIS — E785 Hyperlipidemia, unspecified: Secondary | ICD-10-CM | POA: Diagnosis not present

## 2021-09-30 LAB — LIPID PANEL
Chol/HDL Ratio: 6.9 ratio — ABNORMAL HIGH (ref 0.0–5.0)
Cholesterol, Total: 185 mg/dL (ref 100–199)
HDL: 27 mg/dL — ABNORMAL LOW (ref >39)
LDL Chol Calc (NIH): 108 mg/dL — ABNORMAL HIGH (ref 0–99)
Triglycerides: 286 mg/dL — ABNORMAL HIGH (ref 0–149)
VLDL Cholesterol Cal: 50 mg/dL — ABNORMAL HIGH (ref 5–40)

## 2021-09-30 LAB — HEPATIC FUNCTION PANEL
ALT: 15 IU/L (ref 0–44)
AST: 15 IU/L (ref 0–40)
Albumin: 4 g/dL (ref 3.7–4.7)
Alkaline Phosphatase: 81 IU/L (ref 44–121)
Bilirubin Total: 0.3 mg/dL (ref 0.0–1.2)
Bilirubin, Direct: <0.1 mg/dL (ref 0.00–0.40)
Total Protein: 7 g/dL (ref 6.0–8.5)

## 2021-10-03 ENCOUNTER — Ambulatory Visit (INDEPENDENT_AMBULATORY_CARE_PROVIDER_SITE_OTHER): Payer: Medicare Other | Admitting: *Deleted

## 2021-10-03 DIAGNOSIS — E119 Type 2 diabetes mellitus without complications: Secondary | ICD-10-CM

## 2021-10-03 DIAGNOSIS — I5030 Unspecified diastolic (congestive) heart failure: Secondary | ICD-10-CM

## 2021-10-03 NOTE — Patient Instructions (Signed)
Visit Information  Thank you for taking time to visit with me today. Please don't hesitate to contact me if I can be of assistance to you before our next scheduled telephone appointment.  Following are the goals we discussed today:  Take medications as prescribed   Attend all scheduled provider appointments Call pharmacy for medication refills 3-7 days in advance of running out of medications Call provider office for new concerns or questions  call office if I gain more than 2 pounds in one day or 5 pounds in one week keep legs up while sitting track weight in diary use salt in moderation watch for swelling in feet, ankles and legs every day weigh myself daily follow rescue plan if symptoms flare-up eat more whole grains, fruits and vegetables, lean meats and healthy fats check blood sugar at prescribed times: once daily enter blood sugar readings and medication or insulin into daily log take the blood sugar log to all doctor visits take the blood sugar meter to all doctor visits trim toenails straight across fill half of plate with vegetables Continue to weigh daily and record Continue checking blood pressure several times weekly Read food labels for sodium content Referral for outpatient rehab was completed- please reschedule your appointment Expect a call from social worker for depression and anxiety management  Our next appointment is by telephone on 11/07/21 at 1045 am  Please call the care guide team at [phone] if you need to cancel or reschedule your appointment.   If you are experiencing a Mental Health or [place] or need someone to talk to, please call the Suicide and Crisis Lifeline: 988 call the [place] National Suicide Prevention Lifeline: 304-704-6302 or TTY: 702-396-1129 TTY 718-364-4038) to talk to a trained counselor call 1-800-273-TALK (toll free, 24 hour hotline) go to [facility] [address], [place]  [phone]) call 911   Patient verbalizes understanding of instructions and care plan provided today and agrees to view in [place]. Active MyChart status confirmed with patient.    Jacqlyn Larsen RNC, BSN RN Case Manager [facility] [phone]

## 2021-10-03 NOTE — Chronic Care Management (AMB) (Signed)
Chronic Care Management   CCM RN Visit Note  10/03/2021 Name: Caleb Cuevas MRN: [id number] DOB: [birth year]  Subjective: Caleb Cuevas is a 76 y.o. year old male who is a primary care patient of Caleb Cuevas, Caleb Mcgee, MD. The care management team was consulted for assistance with disease management and care coordination needs.    Engaged with patient by telephone for follow up visit in response to provider referral for case management and/or care coordination services.   Consent to Services:  The patient was given information about Chronic Care Management services, agreed to services, and gave verbal consent prior to initiation of services.  Please see initial visit note for detailed documentation.   Patient agreed to services and verbal consent obtained.   Assessment: Review of patient past medical history, allergies, medications, health status, including review of consultants reports, laboratory and other test data, was performed as part of comprehensive evaluation and provision of chronic care management services.   SDOH (Social Determinants of Health) assessments and interventions performed:    CCM Care Plan  No Known Allergies  Outpatient Encounter Medications as of 10/03/2021  Medication Sig   apixaban (ELIQUIS) 5 MG TABS tablet Take 1 tablet (5 mg total) by mouth 2 (two) times daily.   blood glucose meter kit and supplies KIT Dispense based on patient and insurance preference. Use up to four times daily as directed.   Cholecalciferol (VITAMIN D3) 125 MCG (5000 UT) CAPS Take 5,000 Units by mouth daily.   clopidogrel (PLAVIX) 75 MG tablet Take 1 tablet (75 mg total) by mouth daily.   DULoxetine (CYMBALTA) 60 MG capsule Take 1 capsule (60 mg total) by mouth 2 (two) times daily.   insulin aspart (NOVOLOG) 100 UNIT/ML FlexPen Inject 13-15 Units into the skin See admin instructions. 13 AM -13 Lunch -15 Dinner   Insulin Glargine-yfgn 100 UNIT/ML SOPN Inject 50 Units into the skin at bedtime.    metoprolol succinate (TOPROL-XL) 50 MG 24 hr tablet Take 1 tablet (50 mg total) by mouth 2 (two) times daily. Take with or immediately following a meal.   Multiple Vitamins-Minerals (MULTI-VITAMIN/MINERALS PO) Take 1 tablet by mouth daily.   olmesartan (BENICAR) 20 MG tablet Take 0.5 tablets (10 mg total) by mouth daily.   Tiotropium Bromide Monohydrate (SPIRIVA RESPIMAT) 2.5 MCG/ACT AERS Inhale 2 puffs into the lungs daily.   No facility-administered encounter medications on file as of 10/03/2021.    Patient Active Problem List   Diagnosis Date Noted   Chronic combined systolic and diastolic HF (heart failure) ([place]) 07/21/2021   Coronary artery disease involving native coronary artery of native heart without angina pectoris 40/98/1191   Diastolic CHF, acute ([place]) 02/14/2021   A-fib ([place]) 02/14/2021   Atrial flutter ([place]) 02/14/2021   Hypoxia 02/14/2021   OSA (obstructive sleep apnea) 02/14/2021   Acute respiratory failure with hypoxia ([place]) 02/14/2021   Respiratory arrest ([place])    Acute pulmonary edema ([place]) 02/13/2021   Narrow complex tachycardia ([place]) 02/13/2021   CKD (chronic kidney disease) stage 3, GFR 30-59 ml/min ([place]) 02/13/2021   New onset of congestive heart failure ([place]) 02/13/2021   Peripheral arterial disease ([place]) 01/02/2020   TIA (transient ischemic attack) 06/18/2019   Polycythemia, 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   Leukocytosis 08/24/2018   Nonproliferative diabetic retinopathy ([place])    Unstable angina ([place]) 12/25/2017   Chest pain 12/24/2017   Diabetes mellitus type 2 in nonobese ([place]) 12/24/2017   Essential hypertension 12/24/2017   HLD (hyperlipidemia) 12/24/2017    Conditions to  be addressed/monitored:CHF and DMII  Care Plan : RN Care Manager Plan of Care  Updates made by Kassie Mends, RN since 10/03/2021 12:00 AM     Problem: No plan of care established for management of chronic disease states (CHF, DM2, CAD, HTN)   Priority: High     Long-Range Goal:  Development of plan of care for chronic disease management (CHF, DM2, CAD, HTN)   Start Date: 08/29/2021  Expected End Date: 02/25/2022  Priority: High  Note:   Current Barriers:  Knowledge Deficits related to plan of care for management of CHF and DMII  Spoke with patient and he prefers RN care manager speak with his wife [name] and friend [name] who lives nearby and stops in to assist.  [name] reports she is primary caregiver as pt requires assistance with bathing, dressing and most all ADL's, has walker and cane that uses most of the time, unable to walk independently, feels like has no energy and gets very tired easily, feels that outpatient rehab may be a good option for pt and referral was placed by primary care provider, pt had to cancel his first appointment and plans to reschedule. Patient did obtain a scale and is weighing daily with weight ranges 204-208 pounds, has blood pressure cuff and checks 1-2 x per week, checks CBG once daily with readings 135-180, reports pt drinks adequate water, reports heart monitor findings did not show anything new.  Patient reports he has anxiety and depression that has worsened, is [place] vet and feels referral to psychiatrist may be beneficial and interested in CCM [facility].  RNCM Clinical Goal(s):  Patient will verbalize understanding of plan for management of CHF and DMII as evidenced by patient, CG report, review of EHR and  through collaboration with RN Care manager, provider, and care team.   Interventions: 1:1 collaboration with primary care provider regarding development and update of comprehensive plan of care as evidenced by provider attestation and co-signature Inter-disciplinary care team collaboration (see longitudinal plan of care) Evaluation of current treatment plan related to  self management and patient's adherence to plan as established by provider   Heart Failure Interventions:  (Status:  New goal. and Goal on track:   Yes.) Long Term Goal Assessed need for readable accurate scales in home Provided education about placing scale on hard, flat surface Advised patient to weigh each morning after emptying bladder Discussed importance of daily weight and advised patient to weigh and record daily Discussed the importance of keeping all appointments with provider Screening for signs and symptoms of depression related to chronic disease state  Reinforced Heart Failure action plan Referral placed for CCM LCSW for anxiety and depression management and assistance with psychiatry referral  Diabetes Interventions:  (Status:  New goal. and Goal on track:  Yes.) Long Term Goal Assessed patient's understanding of A1c goal: <7% Reviewed medications with patient and discussed importance of medication adherence Discussed plans with patient for ongoing care management follow up and provided patient with direct contact information for care management team Review of patient status, including review of consultants reports, relevant laboratory and other test results, and medications completed Pain assessment completed Encouraged patient to reschedule outpatient PT appointment In basket message sent to Dr. Dennard Schaumann reporting pt, family is requesting outpatient physical therapy Lab Results  Component Value Date   HGBA1C 6.7 (H) 02/25/2021     Patient Goals/Self-Care Activities: Take medications as prescribed   Attend all scheduled provider appointments Call pharmacy for medication  refills 3-7 days in advance of running out of medications Call provider office for new concerns or questions  call office if I gain more than 2 pounds in one day or 5 pounds in one week keep legs up while sitting track weight in diary use salt in moderation watch for swelling in feet, ankles and legs every day weigh myself daily follow rescue plan if symptoms flare-up eat more whole grains, fruits and vegetables, lean meats and healthy  fats check blood sugar at prescribed times: once daily enter blood sugar readings and medication or insulin into daily log take the blood sugar log to all doctor visits take the blood sugar meter to all doctor visits trim toenails straight across fill half of plate with vegetables Continue to weigh daily and record Continue checking blood pressure several times weekly Read food labels for sodium content Referral for outpatient rehab was completed- please reschedule your appointment Expect a call from social worker for depression and anxiety management       Plan:Telephone follow up appointment with care management team member scheduled for:  11/07/21  Jacqlyn Larsen [facility], BSN RN Case Manager [facility] [phone]

## 2021-10-04 ENCOUNTER — Telehealth: Payer: Self-pay | Admitting: *Deleted

## 2021-10-04 DIAGNOSIS — E119 Type 2 diabetes mellitus without complications: Secondary | ICD-10-CM | POA: Diagnosis not present

## 2021-10-04 DIAGNOSIS — I5030 Unspecified diastolic (congestive) heart failure: Secondary | ICD-10-CM | POA: Diagnosis not present

## 2021-10-04 NOTE — Chronic Care Management (AMB) (Signed)
°  Care Management   Note  10/04/2021 Name: Achilles Neville MRN: [id number] DOB: 1946-02-26  Rashaud Ybarbo is a 76 y.o. year old male who is a primary care patient of Tanya Nones, Priscille Heidelberg, MD and is actively engaged with the care management team. I reached out to [name] by phone today to assist with scheduling an initial visit with the Licensed Clinical Social Worker  Follow up plan: Unsuccessful telephone outreach attempt made. A HIPAA compliant phone message was left for the patient providing contact information and requesting a return call.  The care management team will reach out to the patient again over the next 7 days.  If patient returns call to provider office, please advise to call Embedded Care Management Care Guide Misty Stanley  at [phone].  Gwenevere Ghazi  Care Guide, Embedded Care Coordination [facility] Management  Direct Dial: [phone]

## 2021-10-05 DIAGNOSIS — I129 Hypertensive chronic kidney disease with stage 1 through stage 4 chronic kidney disease, or unspecified chronic kidney disease: Secondary | ICD-10-CM | POA: Diagnosis not present

## 2021-10-05 DIAGNOSIS — E1122 Type 2 diabetes mellitus with diabetic chronic kidney disease: Secondary | ICD-10-CM | POA: Diagnosis not present

## 2021-10-05 DIAGNOSIS — I5030 Unspecified diastolic (congestive) heart failure: Secondary | ICD-10-CM | POA: Diagnosis not present

## 2021-10-05 DIAGNOSIS — N1832 Chronic kidney disease, stage 3b: Secondary | ICD-10-CM | POA: Diagnosis not present

## 2021-10-05 DIAGNOSIS — R079 Chest pain, unspecified: Secondary | ICD-10-CM | POA: Diagnosis not present

## 2021-10-05 DIAGNOSIS — J449 Chronic obstructive pulmonary disease, unspecified: Secondary | ICD-10-CM | POA: Diagnosis not present

## 2021-10-06 NOTE — Chronic Care Management (AMB) (Signed)
?  Care Management  ? ?Note ? ?10/06/2021 ?Name: Caleb Cuevas MRN: [id number] DOB: Sep 11, 1945 ? ?Caleb Cuevas is a 76 y.o. year old male who is a primary care patient of Tanya Nones, Priscille Heidelberg, MD and is actively engaged with the care management team. I reached out to [name] by phone today to assist with scheduling an initial visit with the Licensed Clinical Social Worker ? ?Follow up plan: ?Telephone appointment with care management team member scheduled for:10/18/21 ? ?Gwenevere Ghazi  ?Care Guide, Embedded Care Coordination ?[place]  Care Management  ?Direct Dial: [phone] ? ?

## 2021-10-18 ENCOUNTER — Ambulatory Visit (INDEPENDENT_AMBULATORY_CARE_PROVIDER_SITE_OTHER): Payer: Medicare Other | Admitting: *Deleted

## 2021-10-18 DIAGNOSIS — R5381 Other malaise: Secondary | ICD-10-CM

## 2021-10-18 DIAGNOSIS — E119 Type 2 diabetes mellitus without complications: Secondary | ICD-10-CM

## 2021-10-18 DIAGNOSIS — F339 Major depressive disorder, recurrent, unspecified: Secondary | ICD-10-CM

## 2021-10-18 DIAGNOSIS — F4310 Post-traumatic stress disorder, unspecified: Secondary | ICD-10-CM

## 2021-10-19 ENCOUNTER — Other Ambulatory Visit: Payer: Self-pay | Admitting: Family Medicine

## 2021-10-19 NOTE — Telephone Encounter (Signed)
Per chart we have never prescribed insulin for this patient. ? ?Please advise, thanks! ?
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
## 2021-10-20 ENCOUNTER — Other Ambulatory Visit (HOSPITAL_COMMUNITY): Payer: Self-pay

## 2021-10-20 MED ORDER — INSULIN ASPART 100 UNIT/ML FLEXPEN
PEN_INJECTOR | SUBCUTANEOUS | 5 refills | Status: DC
  Filled 2021-10-20: qty 15, 37d supply, fill #0

## 2021-10-20 MED ORDER — INSULIN GLARGINE-YFGN 100 UNIT/ML ~~LOC~~ SOPN
50.0000 [IU] | PEN_INJECTOR | Freq: Every day | SUBCUTANEOUS | 3 refills | Status: DC
  Filled 2021-10-20: qty 15, 30d supply, fill #0

## 2021-10-21 ENCOUNTER — Telehealth: Payer: Self-pay

## 2021-10-21 NOTE — Telephone Encounter (Signed)
? ?  Telephone encounter was:  Unsuccessful.  10/21/2021 ?Name: Caleb Cuevas MRN: [id number] DOB: Dec 07, 1945 ? ?Unsuccessful outbound call made today to assist with:  Aid and Attendance program for [name], [name], Glass blower/designer. ? ?Outreach Attempt:  1st Attempt ? ?A HIPAA compliant voice message was left requesting a return call.  Instructed patient to call back at [phone]. ? ?Nhia Heaphy, AAS Paralegal, CHC ?Care Guide  Embedded Care Coordination ?[facility]  ?[address] ?[place], [place] [address] ?[email or web address]  ?? [phone]   ?[email or web address] ?  ?

## 2021-10-26 ENCOUNTER — Other Ambulatory Visit: Payer: Self-pay | Admitting: Family Medicine

## 2021-10-26 ENCOUNTER — Other Ambulatory Visit (HOSPITAL_COMMUNITY): Payer: Self-pay

## 2021-10-27 ENCOUNTER — Other Ambulatory Visit (HOSPITAL_COMMUNITY): Payer: Self-pay

## 2021-10-27 ENCOUNTER — Telehealth: Payer: Self-pay | Admitting: Family Medicine

## 2021-10-27 MED ORDER — INSULIN GLARGINE-YFGN 100 UNIT/ML ~~LOC~~ SOPN
50.0000 [IU] | PEN_INJECTOR | Freq: Every day | SUBCUTANEOUS | 3 refills | Status: DC
  Filled 2021-10-27: qty 15, 30d supply, fill #0

## 2021-10-27 NOTE — Telephone Encounter (Signed)
Per pharmacy RX was not fill. Need to change to something else.  ? ?Please advice ?

## 2021-10-27 NOTE — Telephone Encounter (Signed)
Patient's spouse requesting for first refill to be sent to [facility] to avoid a lapse in doses. Requesting for additional refills to be sent to the [place].  ? ?Please advise at [phone]. ?

## 2021-10-27 NOTE — Telephone Encounter (Signed)
Patient's spouse came to the office to request refill of  ? ?DULoxetine (CYMBALTA) 60 MG capsule ? ? ?Pharmacy confirmed as ? ?[facility] - [place] ([place]), [place] - [address]  ?[address], [place] ([place]) [place] [address]  ?Phone:  [phone]  Fax:  [phone]  ?DEA #:  -- ? ?Please advise at [phone]. ?

## 2021-10-28 ENCOUNTER — Telehealth: Payer: Self-pay

## 2021-10-28 ENCOUNTER — Other Ambulatory Visit (HOSPITAL_COMMUNITY): Payer: Self-pay

## 2021-10-28 MED ORDER — DULOXETINE HCL 60 MG PO CPEP: 60.0000 mg | ORAL_CAPSULE | Freq: Two times a day (BID) | ORAL | 3 refills | Status: AC

## 2021-10-28 NOTE — Telephone Encounter (Signed)
? ?  Telephone encounter was:  Successful.  ?10/28/2021 ?Name: Lafayette Dunlevy MRN: [id number] DOB: 1946/07/04 ? ?Jayr Lupercio is a 76 y.o. year old male who is a primary care patient of Pickard, Priscille Heidelberg, MD . The community resource team was consulted for assistance with Aid and Attendance program for [name], [name], [employer] activities/centers ? ?Care guide performed the following interventions: Aid and Attendance program for [name], [name], Glass blower/designer. ? ?Follow Up Plan:  Care guide will follow up with patient by phone over the next 7 days ? ?Zhyon Antenucci, AAS Paralegal, CHC ?Care Guide  Embedded Care Coordination ?[place]  [facility]  ?[address] ?[place], [place] [address] ?[email or web address]  ?? [phone]   ?[email or web address] ?  ?

## 2021-10-28 NOTE — Telephone Encounter (Signed)
Rx sent to pharmacy.  ? ?Patient/wife will need to call when additional refills need to be sent to different pharmacy.  ?

## 2021-11-03 ENCOUNTER — Telehealth: Payer: Self-pay

## 2021-11-03 NOTE — Telephone Encounter (Signed)
? ?  Telephone encounter was:  Successful.  ?11/03/2021 ?Name: Caleb Cuevas MRN: [id number] DOB: May 10, 1946 ? ?Caleb Cuevas is a 76 y.o. year old male who is a primary care patient of Pickard, Priscille Heidelberg, MD . The community resource team was consulted for assistance with  Aid and Attendance, Advance Directive packet ? ?Care guide performed the following interventions: Spoke with patient's spouse Caleb Cuevas she has not received the information mailed for the Aid and Attendance Program.  ? ?Follow Up Plan:  Care guide will follow up with patient by phone over the next 7 days ? ?Caleb Cuevas, AAS Paralegal, CHC ?Care Guide  Embedded Care Coordination ?[place]  Care Management  ?[address] ?[place], [place] [address] ???[email or web address]  ?? [phone]   ?[email or web address] ?  ?

## 2021-11-04 DIAGNOSIS — F339 Major depressive disorder, recurrent, unspecified: Secondary | ICD-10-CM

## 2021-11-04 DIAGNOSIS — E119 Type 2 diabetes mellitus without complications: Secondary | ICD-10-CM | POA: Diagnosis not present

## 2021-11-07 ENCOUNTER — Telehealth: Payer: Self-pay | Admitting: *Deleted

## 2021-11-07 ENCOUNTER — Telehealth: Payer: Self-pay | Admitting: Family Medicine

## 2021-11-07 ENCOUNTER — Telehealth: Payer: Medicare Other

## 2021-11-07 NOTE — Telephone Encounter (Signed)
Patient's spouse Dois Davenport returned missed call from nurse for today's 10:45am appointment. Requesting call back. ? ?Please advise at [phone]. ?

## 2021-11-07 NOTE — Telephone Encounter (Signed)
?  Care Management  ? ?Follow Up Note ? ? ?11/07/2021 ?Name: Caleb Cuevas MRN: [id number] DOB: [birth year] ? ? ?Referred by: Donita Brooks, MD ?Reason for referral : Chronic Care Management (CHF, DM2, HTN) ? ? ?An unsuccessful telephone outreach was attempted today. The patient was referred to the case management team for assistance with care management and care coordination.  ? ?Follow Up Plan: Telephone follow up appointment with care management team member scheduled for:  upon care guide rescheduling. ? ?Irving Shows RNC, BSN ?RN Case Manager ?[facility] ?[phone] ? ? ?

## 2021-11-08 ENCOUNTER — Telehealth: Payer: Self-pay

## 2021-11-08 NOTE — Telephone Encounter (Signed)
? ?  Telephone encounter was:  Successful.  ?11/08/2021 ?Name: Caleb Cuevas MRN: [id number] DOB: March 02, 1946 ? ?Caleb Cuevas is a 76 y.o. year old male who is a primary care patient of Pickard, Priscille Heidelberg, MD . The community resource team was consulted for assistance with Aid and Attendance program for [name], [name], Glass blower/designer. ? ?Care guide performed the following interventions: Spoke with patient's wife Caleb Cuevas she has received resources mailed on 10/28/21.  ? ?Follow Up Plan:  No further follow up planned at this time. The patient has been provided with needed resources. ? ?Elaina Cara, AAS Paralegal, CHC ?Care Guide  Embedded Care Coordination ?[facility]  Care Management  ?[address] ?[place], [place] [address] ?[email or web address]  ?? [phone]   ?[email or web address] ?  ?

## 2021-11-22 ENCOUNTER — Telehealth: Payer: Medicare Other

## 2021-11-22 ENCOUNTER — Encounter: Payer: Self-pay | Admitting: Family Medicine

## 2021-11-22 ENCOUNTER — Telehealth: Payer: Self-pay | Admitting: *Deleted

## 2021-11-22 ENCOUNTER — Encounter: Payer: Self-pay | Admitting: Adult Health

## 2021-11-22 NOTE — Telephone Encounter (Signed)
Noted pt has registered with the [facility] [place] [facility] for all future health care effective 21 November 2021. CSW will sig off at this time. ? ?Reece Levy MSW, LCSW ?Licensed Clinical Social Worker ?[facility]    ?[phone]  ?  ?

## 2021-11-22 NOTE — Telephone Encounter (Signed)
No appointments scheduled

## 2021-12-02 ENCOUNTER — Ambulatory Visit: Payer: Self-pay | Admitting: *Deleted

## 2021-12-02 DIAGNOSIS — I5030 Unspecified diastolic (congestive) heart failure: Secondary | ICD-10-CM

## 2021-12-02 DIAGNOSIS — E119 Type 2 diabetes mellitus without complications: Secondary | ICD-10-CM

## 2021-12-02 NOTE — Chronic Care Management (AMB) (Signed)
? ?  12/02/2021 ? ?Dublin Grayer ?1945-12-12 ?[id number] ? ? ?Message received from patient stating as of 11/21/21 he will have all health care needs including primary care provider with [facility] and case to be closed. ? ?Irving Shows RNC, BSN ?RN Case Manager ?[facility] ?[phone] ? ?

## 2021-12-20 ENCOUNTER — Ambulatory Visit: Payer: Non-veteran care | Admitting: Cardiovascular Disease

## 2021-12-26 ENCOUNTER — Ambulatory Visit: Payer: Medicare Other | Admitting: Cardiology

## 2022-03-06 ENCOUNTER — Encounter (HOSPITAL_COMMUNITY): Payer: Self-pay | Admitting: *Deleted

## 2022-03-06 ENCOUNTER — Inpatient Hospital Stay (HOSPITAL_COMMUNITY): Payer: No Typology Code available for payment source

## 2022-03-06 ENCOUNTER — Other Ambulatory Visit: Payer: Self-pay

## 2022-03-06 ENCOUNTER — Inpatient Hospital Stay (HOSPITAL_COMMUNITY)
Admission: EM | Admit: 2022-03-06 | Discharge: 2022-03-09 | DRG: 064 | Disposition: A | Discharge status: Home Health | Payer: No Typology Code available for payment source | Attending: Internal Medicine | Admitting: Internal Medicine

## 2022-03-06 ENCOUNTER — Emergency Department (HOSPITAL_COMMUNITY): Payer: No Typology Code available for payment source

## 2022-03-06 DIAGNOSIS — I130 Hypertensive heart and chronic kidney disease with heart failure and stage 1 through stage 4 chronic kidney disease, or unspecified chronic kidney disease: Secondary | ICD-10-CM | POA: Diagnosis present

## 2022-03-06 DIAGNOSIS — R531 Weakness: Secondary | ICD-10-CM | POA: Diagnosis not present

## 2022-03-06 DIAGNOSIS — N179 Acute kidney failure, unspecified: Secondary | ICD-10-CM | POA: Diagnosis present

## 2022-03-06 DIAGNOSIS — I6340 Cerebral infarction due to embolism of unspecified cerebral artery: Principal | ICD-10-CM | POA: Diagnosis present

## 2022-03-06 DIAGNOSIS — Z8673 Personal history of transient ischemic attack (TIA), and cerebral infarction without residual deficits: Secondary | ICD-10-CM | POA: Diagnosis not present

## 2022-03-06 DIAGNOSIS — Z7902 Long term (current) use of antithrombotics/antiplatelets: Secondary | ICD-10-CM

## 2022-03-06 DIAGNOSIS — Z66 Do not resuscitate: Secondary | ICD-10-CM | POA: Diagnosis present

## 2022-03-06 DIAGNOSIS — X58XXXA Exposure to other specified factors, initial encounter: Secondary | ICD-10-CM | POA: Diagnosis present

## 2022-03-06 DIAGNOSIS — E113299 Type 2 diabetes mellitus with mild nonproliferative diabetic retinopathy without macular edema, unspecified eye: Secondary | ICD-10-CM | POA: Diagnosis present

## 2022-03-06 DIAGNOSIS — I10 Essential (primary) hypertension: Secondary | ICD-10-CM | POA: Diagnosis not present

## 2022-03-06 DIAGNOSIS — J449 Chronic obstructive pulmonary disease, unspecified: Secondary | ICD-10-CM | POA: Diagnosis present

## 2022-03-06 DIAGNOSIS — E1122 Type 2 diabetes mellitus with diabetic chronic kidney disease: Secondary | ICD-10-CM | POA: Diagnosis present

## 2022-03-06 DIAGNOSIS — S91302A Unspecified open wound, left foot, initial encounter: Secondary | ICD-10-CM | POA: Diagnosis present

## 2022-03-06 DIAGNOSIS — I5043 Acute on chronic combined systolic (congestive) and diastolic (congestive) heart failure: Secondary | ICD-10-CM | POA: Diagnosis present

## 2022-03-06 DIAGNOSIS — I480 Paroxysmal atrial fibrillation: Secondary | ICD-10-CM | POA: Diagnosis present

## 2022-03-06 DIAGNOSIS — R55 Syncope and collapse: Secondary | ICD-10-CM

## 2022-03-06 DIAGNOSIS — Z79899 Other long term (current) drug therapy: Secondary | ICD-10-CM

## 2022-03-06 DIAGNOSIS — F32A Depression, unspecified: Secondary | ICD-10-CM | POA: Diagnosis present

## 2022-03-06 DIAGNOSIS — E1165 Type 2 diabetes mellitus with hyperglycemia: Secondary | ICD-10-CM | POA: Diagnosis present

## 2022-03-06 DIAGNOSIS — R4182 Altered mental status, unspecified: Secondary | ICD-10-CM | POA: Diagnosis not present

## 2022-03-06 DIAGNOSIS — Z7951 Long term (current) use of inhaled steroids: Secondary | ICD-10-CM

## 2022-03-06 DIAGNOSIS — Z87891 Personal history of nicotine dependence: Secondary | ICD-10-CM | POA: Diagnosis not present

## 2022-03-06 DIAGNOSIS — J9601 Acute respiratory failure with hypoxia: Secondary | ICD-10-CM | POA: Diagnosis present

## 2022-03-06 DIAGNOSIS — R4701 Aphasia: Secondary | ICD-10-CM | POA: Diagnosis present

## 2022-03-06 DIAGNOSIS — Z833 Family history of diabetes mellitus: Secondary | ICD-10-CM

## 2022-03-06 DIAGNOSIS — Z7901 Long term (current) use of anticoagulants: Secondary | ICD-10-CM

## 2022-03-06 DIAGNOSIS — I161 Hypertensive emergency: Secondary | ICD-10-CM | POA: Diagnosis present

## 2022-03-06 DIAGNOSIS — J9602 Acute respiratory failure with hypercapnia: Secondary | ICD-10-CM | POA: Diagnosis present

## 2022-03-06 DIAGNOSIS — E119 Type 2 diabetes mellitus without complications: Secondary | ICD-10-CM | POA: Diagnosis not present

## 2022-03-06 DIAGNOSIS — H518 Other specified disorders of binocular movement: Secondary | ICD-10-CM | POA: Diagnosis present

## 2022-03-06 DIAGNOSIS — G4733 Obstructive sleep apnea (adult) (pediatric): Secondary | ICD-10-CM | POA: Diagnosis present

## 2022-03-06 DIAGNOSIS — I2510 Atherosclerotic heart disease of native coronary artery without angina pectoris: Secondary | ICD-10-CM | POA: Diagnosis present

## 2022-03-06 DIAGNOSIS — E7800 Pure hypercholesterolemia, unspecified: Secondary | ICD-10-CM | POA: Diagnosis present

## 2022-03-06 DIAGNOSIS — I639 Cerebral infarction, unspecified: Secondary | ICD-10-CM

## 2022-03-06 DIAGNOSIS — G8191 Hemiplegia, unspecified affecting right dominant side: Secondary | ICD-10-CM | POA: Diagnosis present

## 2022-03-06 DIAGNOSIS — G9341 Metabolic encephalopathy: Secondary | ICD-10-CM | POA: Diagnosis present

## 2022-03-06 DIAGNOSIS — Z794 Long term (current) use of insulin: Secondary | ICD-10-CM

## 2022-03-06 DIAGNOSIS — N1832 Chronic kidney disease, stage 3b: Secondary | ICD-10-CM | POA: Diagnosis present

## 2022-03-06 DIAGNOSIS — I959 Hypotension, unspecified: Secondary | ICD-10-CM | POA: Diagnosis present

## 2022-03-06 LAB — ECHOCARDIOGRAM COMPLETE BUBBLE STUDY
Area-P 1/2: 3.72 cm2
S' Lateral: 3.9 cm

## 2022-03-06 LAB — COMPREHENSIVE METABOLIC PANEL
ALT: 23 U/L (ref 0–44)
AST: 26 U/L (ref 15–41)
Albumin: 3 g/dL — ABNORMAL LOW (ref 3.5–5.0)
Alkaline Phosphatase: 77 U/L (ref 38–126)
Anion gap: 9 (ref 5–15)
BUN: 26 mg/dL — ABNORMAL HIGH (ref 8–23)
CO2: 23 mmol/L (ref 22–32)
Calcium: 8.2 mg/dL — ABNORMAL LOW (ref 8.9–10.3)
Chloride: 107 mmol/L (ref 98–111)
Creatinine, Ser: 2.52 mg/dL — ABNORMAL HIGH (ref 0.61–1.24)
GFR, Estimated: 26 mL/min — ABNORMAL LOW (ref >60)
Glucose, Bld: 185 mg/dL — ABNORMAL HIGH (ref 70–99)
Potassium: 4.1 mmol/L (ref 3.5–5.1)
Sodium: 139 mmol/L (ref 135–145)
Total Bilirubin: 0.9 mg/dL (ref 0.3–1.2)
Total Protein: 7.1 g/dL (ref 6.5–8.1)

## 2022-03-06 LAB — URINALYSIS, ROUTINE W REFLEX MICROSCOPIC
Bilirubin Urine: NEGATIVE
Glucose, UA: NEGATIVE mg/dL
Hgb urine dipstick: NEGATIVE
Ketones, ur: NEGATIVE mg/dL
Leukocytes,Ua: NEGATIVE
Nitrite: NEGATIVE
Protein, ur: 100 mg/dL — AB
Specific Gravity, Urine: 1.015 (ref 1.005–1.030)
pH: 5 (ref 5.0–8.0)

## 2022-03-06 LAB — I-STAT ARTERIAL BLOOD GAS, ED
Acid-base deficit: 3 mmol/L — ABNORMAL HIGH (ref 0.0–2.0)
Bicarbonate: 26.4 mmol/L (ref 20.0–28.0)
Calcium, Ion: 1.18 mmol/L (ref 1.15–1.40)
HCT: 39 % (ref 39.0–52.0)
Hemoglobin: 13.3 g/dL (ref 13.0–17.0)
O2 Saturation: 98 %
Patient temperature: 96
Potassium: 4.1 mmol/L (ref 3.5–5.1)
Sodium: 140 mmol/L (ref 135–145)
TCO2: 28 mmol/L (ref 22–32)
pCO2 arterial: 63.8 mmHg — ABNORMAL HIGH (ref 32–48)
pH, Arterial: 7.217 — ABNORMAL LOW (ref 7.35–7.45)
pO2, Arterial: 134 mmHg — ABNORMAL HIGH (ref 83–108)

## 2022-03-06 LAB — TROPONIN I (HIGH SENSITIVITY)
Troponin I (High Sensitivity): 29 ng/L — ABNORMAL HIGH (ref <18)
Troponin I (High Sensitivity): 31 ng/L — ABNORMAL HIGH (ref <18)
Troponin I (High Sensitivity): 45 ng/L — ABNORMAL HIGH (ref <18)
Troponin I (High Sensitivity): 55 ng/L — ABNORMAL HIGH (ref <18)

## 2022-03-06 LAB — CBC
HCT: 45 % (ref 39.0–52.0)
Hemoglobin: 14 g/dL (ref 13.0–17.0)
MCH: 27.4 pg (ref 26.0–34.0)
MCHC: 31.1 g/dL (ref 30.0–36.0)
MCV: 88.1 fL (ref 80.0–100.0)
Platelets: 224 10*3/uL (ref 150–400)
RBC: 5.11 MIL/uL (ref 4.22–5.81)
RDW: 14.6 % (ref 11.5–15.5)
WBC: 23.3 10*3/uL — ABNORMAL HIGH (ref 4.0–10.5)
nRBC: 0 % (ref 0.0–0.2)

## 2022-03-06 LAB — DIFFERENTIAL
Abs Immature Granulocytes: 0 10*3/uL (ref 0.00–0.07)
Basophils Absolute: 0 10*3/uL (ref 0.0–0.1)
Basophils Relative: 0 %
Eosinophils Absolute: 0.2 10*3/uL (ref 0.0–0.5)
Eosinophils Relative: 1 %
Lymphocytes Relative: 33 %
Lymphs Abs: 7.7 10*3/uL — ABNORMAL HIGH (ref 0.7–4.0)
Monocytes Absolute: 0.7 10*3/uL (ref 0.1–1.0)
Monocytes Relative: 3 %
Neutro Abs: 14.7 10*3/uL — ABNORMAL HIGH (ref 1.7–7.7)
Neutrophils Relative %: 63 %
nRBC: 0 /100 WBC

## 2022-03-06 LAB — POCT I-STAT 7, (LYTES, BLD GAS, ICA,H+H)
Acid-Base Excess: 0 mmol/L (ref 0.0–2.0)
Bicarbonate: 21.7 mmol/L (ref 20.0–28.0)
Calcium, Ion: 1.08 mmol/L — ABNORMAL LOW (ref 1.15–1.40)
HCT: 36 % — ABNORMAL LOW (ref 39.0–52.0)
Hemoglobin: 12.2 g/dL — ABNORMAL LOW (ref 13.0–17.0)
O2 Saturation: 100 %
Patient temperature: 98.6
Potassium: 4.5 mmol/L (ref 3.5–5.1)
Sodium: 138 mmol/L (ref 135–145)
TCO2: 22 mmol/L (ref 22–32)
pCO2 arterial: 25.9 mmHg — ABNORMAL LOW (ref 32–48)
pH, Arterial: 7.53 — ABNORMAL HIGH (ref 7.35–7.45)
pO2, Arterial: 201 mmHg — ABNORMAL HIGH (ref 83–108)

## 2022-03-06 LAB — RESPIRATORY PANEL BY PCR

## 2022-03-06 LAB — PROCALCITONIN: Procalcitonin: 1.34 ng/mL

## 2022-03-06 LAB — I-STAT CHEM 8, ED
BUN: 33 mg/dL — ABNORMAL HIGH (ref 8–23)
Calcium, Ion: 1.1 mmol/L — ABNORMAL LOW (ref 1.15–1.40)
Chloride: 105 mmol/L (ref 98–111)
Creatinine, Ser: 2.4 mg/dL — ABNORMAL HIGH (ref 0.61–1.24)
Glucose, Bld: 185 mg/dL — ABNORMAL HIGH (ref 70–99)
HCT: 46 % (ref 39.0–52.0)
Hemoglobin: 15.6 g/dL (ref 13.0–17.0)
Potassium: 4 mmol/L (ref 3.5–5.1)
Sodium: 141 mmol/L (ref 135–145)
TCO2: 28 mmol/L (ref 22–32)

## 2022-03-06 LAB — GLUCOSE, CAPILLARY
Glucose-Capillary: 150 mg/dL — ABNORMAL HIGH (ref 70–99)
Glucose-Capillary: 171 mg/dL — ABNORMAL HIGH (ref 70–99)
Glucose-Capillary: 190 mg/dL — ABNORMAL HIGH (ref 70–99)
Glucose-Capillary: 261 mg/dL — ABNORMAL HIGH (ref 70–99)
Glucose-Capillary: 305 mg/dL — ABNORMAL HIGH (ref 70–99)
Glucose-Capillary: 388 mg/dL — ABNORMAL HIGH (ref 70–99)

## 2022-03-06 LAB — APTT
aPTT: 31 seconds (ref 24–36)
aPTT: 83 seconds — ABNORMAL HIGH (ref 24–36)

## 2022-03-06 LAB — PROTIME-INR
INR: 1.4 — ABNORMAL HIGH (ref 0.8–1.2)
Prothrombin Time: 16.6 seconds — ABNORMAL HIGH (ref 11.4–15.2)

## 2022-03-06 LAB — STREP PNEUMONIAE URINARY ANTIGEN: Strep Pneumo Urinary Antigen: NEGATIVE

## 2022-03-06 LAB — BRAIN NATRIURETIC PEPTIDE: B Natriuretic Peptide: 253.4 pg/mL — ABNORMAL HIGH (ref 0.0–100.0)

## 2022-03-06 LAB — HEPARIN LEVEL (UNFRACTIONATED): Heparin Unfractionated: >1.1 IU/mL — ABNORMAL HIGH (ref 0.30–0.70)

## 2022-03-06 LAB — LACTIC ACID, PLASMA
Lactic Acid, Venous: 1.8 mmol/L (ref 0.5–1.9)
Lactic Acid, Venous: 2.6 mmol/L (ref 0.5–1.9)
Lactic Acid, Venous: 2.4 mmol/L (ref 0.5–1.9)

## 2022-03-06 LAB — CBG MONITORING, ED: Glucose-Capillary: 183 mg/dL — ABNORMAL HIGH (ref 70–99)

## 2022-03-06 LAB — ETHANOL: Alcohol, Ethyl (B): <10 mg/dL (ref <10)

## 2022-03-06 LAB — HEMOGLOBIN A1C
Hgb A1c MFr Bld: 6.5 % — ABNORMAL HIGH (ref 4.8–5.6)
Mean Plasma Glucose: 139.85 mg/dL

## 2022-03-06 LAB — MRSA NEXT GEN BY PCR, NASAL: MRSA by PCR Next Gen: NOT DETECTED

## 2022-03-06 MED ORDER — MIDAZOLAM HCL 2 MG/2ML IJ SOLN
INTRAMUSCULAR | Status: AC
  Administered 2022-03-06: 2 mg
  Filled 2022-03-06: qty 2

## 2022-03-06 MED ORDER — ORAL CARE MOUTH RINSE
15.0000 mL | OROMUCOSAL | Status: DC
  Administered 2022-03-06 – 2022-03-07 (×13): 15 mL via OROMUCOSAL

## 2022-03-06 MED ORDER — SODIUM CHLORIDE 0.9 % IV SOLN: 250.0000 mL | INTRAVENOUS | Status: DC

## 2022-03-06 MED ORDER — POLYETHYLENE GLYCOL 3350 17 G PO PACK
17.0000 g | PACK | Freq: Every day | ORAL | Status: DC
  Administered 2022-03-06: 17 g
  Filled 2022-03-06: qty 1

## 2022-03-06 MED ORDER — DOCUSATE SODIUM 50 MG/5ML PO LIQD
100.0000 mg | Freq: Two times a day (BID) | ORAL | Status: DC
Start: 2022-03-06 — End: 2022-03-07
  Administered 2022-03-06 (×2): 100 mg
  Filled 2022-03-06 (×2): qty 10

## 2022-03-06 MED ORDER — DOCUSATE SODIUM 50 MG/5ML PO LIQD: 100.0000 mg | Freq: Two times a day (BID) | ORAL | Status: DC | PRN

## 2022-03-06 MED ORDER — SODIUM CHLORIDE 0.9% FLUSH
3.0000 mL | Freq: Once | INTRAVENOUS | Status: AC
  Administered 2022-03-06: 3 mL via INTRAVENOUS

## 2022-03-06 MED ORDER — PANTOPRAZOLE 2 MG/ML SUSPENSION
40.0000 mg | Freq: Every day | ORAL | Status: DC
Start: 2022-03-06 — End: 2022-03-07
  Administered 2022-03-06: 40 mg
  Filled 2022-03-06 (×2): qty 20

## 2022-03-06 MED ORDER — ARFORMOTEROL TARTRATE 15 MCG/2ML IN NEBU
15.0000 ug | INHALATION_SOLUTION | Freq: Two times a day (BID) | RESPIRATORY_TRACT | Status: DC
  Administered 2022-03-06 – 2022-03-09 (×7): 15 ug via RESPIRATORY_TRACT
  Filled 2022-03-06 (×7): qty 2

## 2022-03-06 MED ORDER — INSULIN ASPART 100 UNIT/ML IJ SOLN
4.0000 [IU] | INTRAMUSCULAR | Status: DC
  Administered 2022-03-06 – 2022-03-07 (×6): 4 [IU] via SUBCUTANEOUS

## 2022-03-06 MED ORDER — FENTANYL BOLUS VIA INFUSION
25.0000 ug | INTRAVENOUS | Status: DC | PRN
  Administered 2022-03-06: 50 ug via INTRAVENOUS

## 2022-03-06 MED ORDER — PROSOURCE TF PO LIQD
45.0000 mL | Freq: Three times a day (TID) | ORAL | Status: DC
  Administered 2022-03-06 (×2): 45 mL
  Filled 2022-03-06 (×9): qty 45

## 2022-03-06 MED ORDER — ROCURONIUM BROMIDE 50 MG/5ML IV SOLN
INTRAVENOUS | Status: DC | PRN
  Administered 2022-03-06: 100 mg via INTRAVENOUS

## 2022-03-06 MED ORDER — NOREPINEPHRINE 4 MG/250ML-% IV SOLN: 2.0000 ug/min | INTRAVENOUS | Status: DC

## 2022-03-06 MED ORDER — VITAL 1.5 CAL PO LIQD
1000.0000 mL | ORAL | Status: DC
  Administered 2022-03-06: 1000 mL
  Filled 2022-03-06 (×4): qty 1000

## 2022-03-06 MED ORDER — IOHEXOL 350 MG/ML SOLN
60.0000 mL | Freq: Once | INTRAVENOUS | Status: AC | PRN
  Administered 2022-03-06: 60 mL via INTRAVENOUS

## 2022-03-06 MED ORDER — HEPARIN (PORCINE) 25000 UT/250ML-% IV SOLN
1700.0000 [IU]/h | INTRAVENOUS | Status: AC
Start: 2022-03-06 — End: 2022-03-08
  Administered 2022-03-06 – 2022-03-07 (×3): 1400 [IU]/h via INTRAVENOUS
  Administered 2022-03-08: 1700 [IU]/h via INTRAVENOUS
  Filled 2022-03-06 (×4): qty 250

## 2022-03-06 MED ORDER — MIDAZOLAM HCL 2 MG/2ML IJ SOLN
2.0000 mg | INTRAMUSCULAR | Status: DC | PRN
  Filled 2022-03-06: qty 2

## 2022-03-06 MED ORDER — PROPOFOL 1000 MG/100ML IV EMUL: 0.0000 ug/kg/min | INTRAVENOUS | Status: DC

## 2022-03-06 MED ORDER — FUROSEMIDE 10 MG/ML IJ SOLN
80.0000 mg | Freq: Once | INTRAMUSCULAR | Status: AC
  Administered 2022-03-06: 80 mg via INTRAVENOUS
  Filled 2022-03-06: qty 8

## 2022-03-06 MED ORDER — FENTANYL CITRATE PF 50 MCG/ML IJ SOSY
25.0000 ug | PREFILLED_SYRINGE | Freq: Once | INTRAMUSCULAR | Status: AC
  Administered 2022-03-06: 25 ug via INTRAVENOUS

## 2022-03-06 MED ORDER — SODIUM CHLORIDE 0.9 % IV SOLN: INTRAVENOUS | Status: DC

## 2022-03-06 MED ORDER — HYDRALAZINE HCL 20 MG/ML IJ SOLN
5.0000 mg | INTRAMUSCULAR | Status: DC | PRN
  Administered 2022-03-06 – 2022-03-08 (×2): 5 mg via INTRAVENOUS
  Filled 2022-03-06 (×3): qty 1

## 2022-03-06 MED ORDER — TIOTROPIUM BROMIDE MONOHYDRATE 18 MCG IN CAPS: 18.0000 ug | ORAL_CAPSULE | Freq: Every day | RESPIRATORY_TRACT | Status: DC

## 2022-03-06 MED ORDER — ORAL CARE MOUTH RINSE: 15.0000 mL | OROMUCOSAL | Status: DC | PRN

## 2022-03-06 MED ORDER — DEXMEDETOMIDINE HCL IN NACL 400 MCG/100ML IV SOLN
0.4000 ug/kg/h | INTRAVENOUS | Status: DC
  Administered 2022-03-06: 0.4 ug/kg/h via INTRAVENOUS
  Administered 2022-03-06: 0.8 ug/kg/h via INTRAVENOUS
  Administered 2022-03-07 (×2): 1 ug/kg/h via INTRAVENOUS
  Filled 2022-03-06 (×5): qty 100

## 2022-03-06 MED ORDER — ATORVASTATIN CALCIUM 40 MG PO TABS
40.0000 mg | ORAL_TABLET | Freq: Every day | ORAL | Status: DC
  Administered 2022-03-06 – 2022-03-07 (×2): 40 mg
  Filled 2022-03-06 (×2): qty 1

## 2022-03-06 MED ORDER — PROPOFOL 1000 MG/100ML IV EMUL
5.0000 ug/kg/min | INTRAVENOUS | Status: DC
  Administered 2022-03-06: 20 ug/kg/min via INTRAVENOUS

## 2022-03-06 MED ORDER — ETOMIDATE 2 MG/ML IV SOLN
INTRAVENOUS | Status: DC | PRN
  Administered 2022-03-06: 30 mg via INTRAVENOUS

## 2022-03-06 MED ORDER — IPRATROPIUM-ALBUTEROL 0.5-2.5 (3) MG/3ML IN SOLN: 3.0000 mL | Freq: Four times a day (QID) | RESPIRATORY_TRACT | Status: DC | PRN

## 2022-03-06 MED ORDER — FENTANYL 2500MCG IN NS 250ML (10MCG/ML) PREMIX INFUSION
25.0000 ug/h | INTRAVENOUS | Status: DC
  Administered 2022-03-06: 100 ug/h via INTRAVENOUS
  Filled 2022-03-06 (×2): qty 250

## 2022-03-06 MED ORDER — INSULIN DETEMIR 100 UNIT/ML ~~LOC~~ SOLN
12.0000 [IU] | Freq: Two times a day (BID) | SUBCUTANEOUS | Status: DC
  Administered 2022-03-06 – 2022-03-09 (×6): 12 [IU] via SUBCUTANEOUS
  Filled 2022-03-06 (×8): qty 0.12

## 2022-03-06 MED ORDER — INSULIN ASPART 100 UNIT/ML IJ SOLN
0.0000 [IU] | INTRAMUSCULAR | Status: DC
  Administered 2022-03-06: 8 [IU] via SUBCUTANEOUS
  Administered 2022-03-06: 3 [IU] via SUBCUTANEOUS
  Administered 2022-03-06: 11 [IU] via SUBCUTANEOUS
  Administered 2022-03-06: 3 [IU] via SUBCUTANEOUS
  Administered 2022-03-07: 5 [IU] via SUBCUTANEOUS
  Administered 2022-03-07: 2 [IU] via SUBCUTANEOUS
  Administered 2022-03-07: 15 [IU] via SUBCUTANEOUS
  Administered 2022-03-07: 8 [IU] via SUBCUTANEOUS
  Administered 2022-03-08 (×4): 2 [IU] via SUBCUTANEOUS
  Administered 2022-03-09: 5 [IU] via SUBCUTANEOUS
  Administered 2022-03-09: 2 [IU] via SUBCUTANEOUS

## 2022-03-06 MED ORDER — SODIUM CHLORIDE 0.9 % IV SOLN
2.0000 g | INTRAVENOUS | Status: DC
  Administered 2022-03-06 – 2022-03-08 (×3): 2 g via INTRAVENOUS
  Filled 2022-03-06 (×3): qty 20

## 2022-03-06 MED ORDER — UMECLIDINIUM BROMIDE 62.5 MCG/ACT IN AEPB
1.0000 | INHALATION_SPRAY | Freq: Every day | RESPIRATORY_TRACT | Status: DC
  Filled 2022-03-06: qty 7

## 2022-03-06 MED ORDER — REVEFENACIN 175 MCG/3ML IN SOLN
175.0000 ug | Freq: Every day | RESPIRATORY_TRACT | Status: DC
  Administered 2022-03-06 – 2022-03-09 (×4): 175 ug via RESPIRATORY_TRACT
  Filled 2022-03-06 (×4): qty 3

## 2022-03-06 MED ORDER — POLYETHYLENE GLYCOL 3350 17 G PO PACK: 17.0000 g | PACK | Freq: Every day | ORAL | Status: DC | PRN

## 2022-03-06 MED ORDER — SODIUM CHLORIDE 0.9 % IV SOLN
500.0000 mg | INTRAVENOUS | Status: DC
  Administered 2022-03-06 – 2022-03-08 (×3): 500 mg via INTRAVENOUS
  Filled 2022-03-06 (×4): qty 5

## 2022-03-06 MED ORDER — CHLORHEXIDINE GLUCONATE CLOTH 2 % EX PADS
6.0000 | MEDICATED_PAD | Freq: Every day | CUTANEOUS | Status: DC
  Administered 2022-03-06 – 2022-03-09 (×3): 6 via TOPICAL

## 2022-03-06 NOTE — Progress Notes (Signed)
Initial Nutrition Assessment  DOCUMENTATION CODES:   Not applicable  INTERVENTION:   Initiate tube feeding via OG tube: Vital 1.5 at 60 ml/h (1440 ml per day) Prosource TF 45 ml TID  Provides 2280 kcal, 130 gm protein, 1100 ml free water daily.  NUTRITION DIAGNOSIS:   Inadequate oral intake related to inability to eat as evidenced by NPO status.  GOAL:   Patient will meet greater than or equal to 90% of their needs  MONITOR:   Vent status, TF tolerance, Labs  REASON FOR ASSESSMENT:   Ventilator, Consult Enteral/tube feeding initiation and management  ASSESSMENT:   76 yo male admitted with acute respiratory failure, hypertensive emergency, pulmonary edema. PMH includes HTN, HLD, DM-2, retinopathy, CHF, A fib, OSA.  Discussed patient in ICU rounds and with RN today. Received MD Consult for TF initiation and management. OG tube in place. Per discussion with patient's wife and a friend at bedside, patient typically has a good appetite. He eats 3-4 meals and snacks per day. Weight has been stable with no nutrition concerns PTA.  Weight history reviewed. Usual weight ~94 kg Oct 2022 - February 2023. Currently 105.9 kg, weight up with edema.  Patient is currently intubated on ventilator support MV: 12.4 L/min Temp (24hrs), Avg:98.7 F (37.1 C), Min:98.2 F (36.8 C), Max:99 F (37.2 C)   Labs reviewed.  CBG: 171-190  Medications reviewed and include colace, novolog, protonix, miralax, fentanyl.  NUTRITION - FOCUSED PHYSICAL EXAM:  Flowsheet Row Most Recent Value  Orbital Region No depletion  Upper Arm Region No depletion  Thoracic and Lumbar Region No depletion  Buccal Region No depletion  Temple Region No depletion  Clavicle Bone Region No depletion  Clavicle and Acromion Bone Region No depletion  Scapular Bone Region No depletion  Dorsal Hand No depletion  Patellar Region Mild depletion  Anterior Thigh Region Mild depletion  Posterior Calf Region Mild  depletion  Edema (RD Assessment) Mild  Hair Reviewed  Eyes Unable to assess  Mouth Unable to assess  Skin Reviewed  Nails Reviewed       Diet Order:   Diet Order             Diet NPO time specified  Diet effective now                   EDUCATION NEEDS:   No education needs have been identified at this time  Skin:  Skin Assessment: Skin Integrity Issues: Skin Integrity Issues:: Diabetic Ulcer Diabetic Ulcer: L toe  Last BM:  no BM documented  Height:   Ht Readings from Last 1 Encounters:  03/06/22 6' (1.829 m)    Weight:   Wt Readings from Last 1 Encounters:  03/06/22 105.9 kg    Ideal Body Weight:  80.9 kg  BMI:  Body mass index is 31.66 kg/m.  Estimated Nutritional Needs:   Kcal:  2100-2300  Protein:  130-140 gm  Fluid:  2-2.2 L    Gabriel Rainwater RD, LDN, CNSC Please refer to Amion for contact information.

## 2022-03-06 NOTE — Code Documentation (Signed)
Stroke Response Nurse Documentation Code Documentation  Caleb Cuevas is a 76 y.o. male arriving to [facility]  via [facility] on 7/31 with past medical hx of afib,HTN, CKD, DM 2, OSA, CHF, TIA. On Eliquis (apixaban) daily. Code stroke was activated by EMS.   Patient from home where he was LKW at 0045 and now complaining of left gaze and right sided weakness.   Stroke team at the bedside on patient arrival. Labs drawn and patient cleared for CT by Dr. Eudelia Bunch. Pt in respiratory distress on arrival moving all four extremities and immediately intubated due to respiratory failure with sats in the 50s on CPAP upon arrival to ED. Patient to CT with team. NIHSS 39, see documentation for details and code stroke times. Patient with decreased LOC, disoriented, not following commands, left gaze preference , bilateral hemianopia, bilateral facial droop, bilateral arm weakness, bilateral leg weakness, bilateral decreased sensation, Global aphasia , dysarthria , and Sensory  neglect on exam. The following imaging was completed:  CT Head and CTA. Patient is not a candidate for IV Thrombolytic due to Eliquis. Patient is not a candidate for IR due to No LVO.   Bedside handoff with ED RN [place].    Rose Fillers  Rapid Response RN

## 2022-03-06 NOTE — Progress Notes (Signed)
ANTICOAGULATION CONSULT NOTE - Initial Consult  Pharmacy Consult for Heparin Indication: atrial fibrillation  No Known Allergies  Patient Measurements: Height: 6' (182.9 cm) Weight: 105.9 kg (233 lb 7.5 oz) IBW/kg (Calculated) : 77.6 Heparin Dosing Weight: 100 kg  Vital Signs: Temp: 98.4 F (36.9 C) (07/31 0815) Temp Source: Core (Comment) (07/31 0617) BP: 146/68 (07/31 0815) Pulse Rate: 73 (07/31 0800)  Labs: Recent Labs    03/06/22 0224 03/06/22 0231 03/06/22 0319 03/06/22 0537 03/06/22 0810  HGB 14.0 15.6 13.3  --   --   HCT 45.0 46.0 39.0  --   --   PLT 224  --   --   --   --   APTT 31  --   --   --   --   LABPROT 16.6*  --   --   --   --   INR 1.4*  --   --   --   --   CREATININE 2.52* 2.40*  --   --   --   TROPONINIHS 29*  --   --  31* 45*    Estimated Creatinine Clearance: 33.4 mL/min (A) (by C-G formula based on SCr of 2.4 mg/dL (H)).   Medical History: Past Medical History:  Diagnosis Date   A-fib (HCC) 02/14/2021   Arthritis    "all over" (12/25/2017)   Atrial flutter, paroxysmal (HCC) 02/14/2021   Diastolic CHF, acute (HCC) 02/14/2021   High cholesterol    Hx of myocardial perfusion scan    [facility] for Chest pain 05/2015- No ischemia   Hypertension    Hypoxia 02/14/2021   Nonproliferative diabetic retinopathy (HCC)    bilateral   OSA (obstructive sleep apnea) 02/14/2021   Type II diabetes mellitus (HCC)     Medications:  Scheduled:   Chlorhexidine Gluconate Cloth  6 each Topical Daily   docusate  100 mg Per Tube BID   insulin aspart  0-15 Units Subcutaneous Q4H   mouth rinse  15 mL Mouth Rinse Q2H   pantoprazole sodium  40 mg Per Tube Daily   polyethylene glycol  17 g Per Tube Daily   umeclidinium bromide  1 puff Inhalation Daily   Infusions:   sodium chloride     azithromycin Stopped (03/06/22 0618)   cefTRIAXone (ROCEPHIN)  IV Stopped (03/06/22 0406)   fentaNYL infusion INTRAVENOUS 100 mcg/hr (03/06/22 0800)    norepinephrine (LEVOPHED) Adult infusion      Assessment: 76 y.o. M presents with hypoxemia and hypertensive emergency. Pt on apixaban PTA for afib/h/o CVA. Unsure timing of last dose but at least 12 hours ago. Baseline aPTT 31 sec. CBC ok. Eliquis likely affecting heparin level. Will utilize aPTT monitoring until levels are correlating.  Goal of Therapy:  Heparin level 0.3-0.7 units/ml, aPTT 66-102 sec Monitor platelets by anticoagulation protocol: Yes   Plan:  Begin heparin gtt at 1400 units/hr. No bolus. Will f/u 8hr aPTT and heparin level Daily aPTT, heparin level, and CBC  Christoper Fabian, PharmD, BCPS Please see amion for complete clinical pharmacist phone list 03/06/2022,9:17 AM

## 2022-03-06 NOTE — Inpatient Diabetes Management (Signed)
Inpatient Diabetes Program Recommendations  AACE/ADA: New Consensus Statement on Inpatient Glycemic Control (2015)  Target Ranges:  Prepandial:   less than 140 mg/dL      Peak postprandial:   less than 180 mg/dL (1-2 hours)      Critically ill patients:  140 - 180 mg/dL   Lab Results  Component Value Date   GLUCAP 190 (H) 03/06/2022   HGBA1C 6.5 (H) 03/06/2022    Review of Glycemic Control  Diabetes history: Type 2 DM Outpatient Diabetes medications: Novolog 18 units TID, Semglee 48 units QHS Current orders for Inpatient glycemic control: Novolog 0-15 units Q4H Vital 1.5 cal @ 37ml/hr  Inpatient Diabetes Program Recommendations:    Consider: - Adding Levemir 12 units BID - adding Novolog 3 units Q4H for tube feed coverage (to be stopped or held in event tube feeds are stopped).   Thanks, Lujean Rave, MSN, RNC-OB Diabetes Coordinator [phone] (8a-5p)

## 2022-03-06 NOTE — Progress Notes (Signed)
Pt transported from TRA-B to 3M12 without any complications. RN at bedside.

## 2022-03-06 NOTE — ED Notes (Signed)
Pt continues to be hypotensive. NS bolus given per EDP

## 2022-03-06 NOTE — TOC Initial Note (Signed)
Transition of Care [facility] - Initial/Assessment Note    Patient Details  Name: Caleb Cuevas MRN: [id number] Date of Birth: 12/23/1945  Transition of Care [facility] CM/SW Contact:    Tom-Johnson, Hershal Coria, RN Phone Number: 03/06/2022, 4:20 PM  Clinical Narrative:                  Patient is admitted for Acute Hypoxemic Respiratory Failure 2/2 Hypertensive Emergency with Pulmonary Edema. Currently intubated.  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  Authorization # is [id number]. CM will continue to follow with needs as patient progresses with care.   Barriers to Discharge: Continued Medical Work up   Patient Goals and CMS Choice Patient states their goals for this hospitalization and ongoing recovery are:: To return home with wife      Expected Discharge Plan and Services     Discharge Planning Services: CM Consult   Living arrangements for the past 2 months: Single Family Home                                      Prior Living Arrangements/Services Living arrangements for the past 2 months: Single Family Home Lives with:: Spouse Patient language and need for interpreter reviewed:: Yes        Need for Family Participation in Patient Care: Yes (Comment) Care giver support system in place?: Yes (comment)   Criminal Activity/Legal Involvement Pertinent to Current Situation/Hospitalization: No - Comment as needed  Activities of Daily Living   ADL Screening (condition at time of admission) Is the patient deaf or have difficulty hearing?: No Does the patient have difficulty seeing, even when wearing glasses/contacts?: No Does the patient have difficulty concentrating, remembering, or making decisions?: Yes Does the patient have difficulty dressing or bathing?: Yes Does the patient have difficulty walking or climbing stairs?: Yes  Permission Sought/Granted Permission sought to share information with : Case  Manager, Family Supports                Emotional Assessment Appearance:: Appears stated age Attitude/Demeanor/Rapport: Unable to Assess Affect (typically observed): Unable to Assess Orientation: : Oriented to Self Alcohol / Substance Use: Not Applicable Psych Involvement: No (comment)  Admission diagnosis:  Hypertensive emergency [I16.1] Right sided weakness [R53.1] Acute respiratory failure with hypoxia and hypercapnia (HCC) [J96.01, J96.02] Patient Active Problem List   Diagnosis Date Noted   Hypertensive emergency 03/06/2022   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) 07/21/2021   Coronary artery disease involving native coronary artery of native heart without angina pectoris 07/21/2021   Diastolic CHF, acute (HCC) 02/14/2021   A-fib (HCC) 02/14/2021   Atrial flutter (HCC) 02/14/2021   Hypoxia 02/14/2021   OSA (obstructive sleep apnea) 02/14/2021   Acute respiratory failure with hypoxia (HCC) 02/14/2021   Respiratory arrest (HCC)    Acute pulmonary edema (HCC) 02/13/2021   Narrow complex tachycardia (HCC) 02/13/2021   CKD (chronic kidney disease) stage 3, GFR 30-59 ml/min (HCC) 02/13/2021   New onset of congestive heart failure (HCC) 02/13/2021   Peripheral arterial disease (HCC) 01/02/2020   TIA (transient ischemic attack) 06/18/2019   Polycythemia, 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 ([place]), [place] - [address] [place] ([place]) [place] [address] Phone: [phone] Fax: [phone]  [facility] - [place], [place] - [address] [place] [address] Phone: [phone] Fax:  [phone]  [facility] - [place], [place] - [address] [place] [address] Phone: [phone] Fax: [phone]     Social Determinants of Health (SDOH) Interventions    Readmission Risk Interventions     No data to display

## 2022-03-06 NOTE — Progress Notes (Addendum)
An USGPIV (ultrasound guided PIV) 20G x 1.88" on RFA has been placed for short-term vasopressor infusion. A correctly placed ivWatch must be used when administering Vasopressors. Should this treatment be needed beyond 72 hours, central line access should be obtained.  It will be the responsibility of the bedside nurse to follow best practice to prevent extravasations.   

## 2022-03-06 NOTE — ED Notes (Signed)
Pt noted to be reaching for ET tube with both hands. Soft wrist restraints placed
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
## 2022-03-06 NOTE — Progress Notes (Signed)
  Echocardiogram 2D Echocardiogram has been performed.  Augustine Radar 03/06/2022, 1:44 PM

## 2022-03-06 NOTE — Progress Notes (Signed)
Pt transported from TRA-B to CT and back without any complications. RN X2 at bedside.

## 2022-03-06 NOTE — ED Triage Notes (Signed)
Pt from home with [facility] for respiratory distress, also activated as a code stroke. Per report pt woke his wife up reporting he could not move his R arm and couldn't breath at 0045. On arrival of first responders, pt speaking, oxygen sats in the 30s, R sided deficits with L sided gaze. Initial BP with EMS 255/185. EMS gave 125mg  solumedrol, 10mg  Albuterol, 0.5mg  Atrovent and placed on CPAP. On arrival to ED, pt moving all extremities, in distress, arrived to Tra B to be intubated
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
## 2022-03-06 NOTE — ED Notes (Signed)
Attempted to decreased propofol rate due to hypotension; pt shaking his head slowly, tears noted

## 2022-03-06 NOTE — Progress Notes (Addendum)
STROKE TEAM PROGRESS NOTE   INTERVAL HISTORY Patient is seen in his room with his wife and neighbor at the bedside.  Yesterday, he had acute onset right sided numbness, right sided weakness, left gaze deviation and aphasia.  EMS was called, he was brought to the hospital and intubated for airway protection.  He was not a candidate for TNK due to Eliquis use, and no LVO was found on CTA.  CT head showed no acute abnormality.  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  Patient has been started on antibiotics for his hypoxic respiratory failure.  He remains intubated and sedated with exam limited as a result.  Vitals:   03/06/22 1143 03/06/22 1145 03/06/22 1200 03/06/22 1215  BP: (!) 167/72 (!) 167/72 (!) 168/123 (!) 168/65  Pulse: 69 69 73 72  Resp: 18 18 19 18   Temp: 98.8 F (37.1 C) 98.8 F (37.1 C) 98.8 F (37.1 C) 98.8 F (37.1 C)  TempSrc:      SpO2: 100% 100% 100% 100%  Weight:      Height:       CBC:  Recent Labs  Lab 03/06/22 0224 03/06/22 0231 03/06/22 0319 03/06/22 1004  WBC 23.3*  --   --   --   NEUTROABS 14.7*  --   --   --   HGB 14.0   < > 13.3 12.2*  HCT 45.0   < > 39.0 36.0*  MCV 88.1  --   --   --   PLT 224  --   --   --    < > = values in this interval not displayed.   Basic Metabolic Panel:  Recent Labs  Lab 03/06/22 0224 03/06/22 0231 03/06/22 0319 03/06/22 1004  NA 139 141 140 138  K 4.1 4.0 4.1 4.5  CL 107 105  --   --   CO2 23  --   --   --   GLUCOSE 185* 185*  --   --   BUN 26* 33*  --   --   CREATININE 2.52* 2.40*  --   --   CALCIUM 8.2*  --   --   --    Lipid Panel: No results for input(s): "CHOL", "TRIG", "HDL", "CHOLHDL", "VLDL", "LDLCALC" in the last 168 hours. HgbA1c:  Recent Labs  Lab 03/06/22 0810  HGBA1C 6.5*   Urine Drug Screen: No results for input(s): "LABOPIA", "COCAINSCRNUR", "LABBENZ", "AMPHETMU", "THCU", "LABBARB" in the last 168 hours.   Alcohol Level  Recent Labs  Lab 03/06/22 0224  ETH <10    IMAGING past 24 hours CT HEAD CODE STROKE WO CONTRAST  Result Date: 03/06/2022 CLINICAL DATA:  Initial evaluation for neuro deficit, stroke suspected. EXAM: CT ANGIOGRAPHY HEAD AND NECK TECHNIQUE: Multidetector CT imaging of the head and neck was performed using the standard protocol during bolus administration of intravenous contrast. Multiplanar CT image reconstructions and MIPs were obtained to evaluate the vascular anatomy. Carotid stenosis measurements (when applicable) are obtained utilizing NASCET criteria, using the distal internal carotid diameter as the denominator. RADIATION DOSE REDUCTION: This exam was performed according to the departmental dose-optimization program which includes automated exposure control, adjustment of the mA and/or kV according to patient size and/or use of iterative reconstruction technique. CONTRAST:  37mL OMNIPAQUE IOHEXOL 350 MG/ML SOLN COMPARISON:  Prior Study from 09/23/2021. FINDINGS: CT HEAD FINDINGS Brain: Age-related cerebral atrophy with moderate chronic microvascular ischemic disease. Probable  small remote lacunar infarct at the posterior left frontal centrum semi ovale. No acute intracranial hemorrhage. No acute large vessel territory infarct. No mass lesion, mass effect or midline shift. No hydrocephalus or extra-axial fluid collection. Vascular: No hyperdense vessel. Calcified atherosclerosis present at skull base. Skull: Scalp soft tissues demonstrate no acute finding. Calvarium intact. Sinuses/Orbits: Globes orbital soft tissues demonstrate no acute finding. Paranasal sinuses are largely clear. Right mastoid and middle ear effusion, similar to prior, and likely chronic. Other: None. ASPECTS [facility] Stroke Program Early CT Score) - Ganglionic level infarction (caudate, lentiform nuclei, internal capsule, insula, M1-M3 cortex): 7 - Supraganglionic infarction (M4-M6 cortex): 3 Total score (0-10  with 10 being normal): 10 CTA NECK FINDINGS Aortic arch: Aortic arch and origin of the great vessels not included or assessed on this examination. Right carotid system: Visualized right CCA widely patent. Atheromatous irregularity about the right carotid bulb/proximal right ICA without hemodynamically significant greater than 50% stenosis. Right ICA patent distally without stenosis or dissection. Left carotid system: Visualized left CCA widely patent. Atheromatous irregularity about the left carotid bulb/proximal left ICA without hemodynamically significant greater than 50% stenosis. Left ICA patent distally without stenosis or dissection. Vertebral arteries: Both vertebral arteries arise from the subclavian arteries. Vertebral arteries irregular but patent within the neck without stenosis or dissection. Skeleton: No discrete or worrisome osseous lesions. Moderate to advanced multilevel cervical spondylosis, most pronounced at C3-4. Other neck: Endotracheal and enteric tubes in place. No other acute soft tissue abnormality within the neck. 1.2 cm nodular lesion within the subcutaneous fat of the left suboccipital region, nonspecific, but likely a sebaceous cyst (series 10, image 90). Additionally, asymmetric soft tissue density noted within the subcutaneous fat overlying the right trapezius muscle (series 10, image 142), nonspecific. Upper chest: Large layering bilateral pleural effusions, right greater than left, partially visualized. Associated atelectasis. Additional ground-glass opacity with interlobular septal thickening within the visualized lungs, conchae suggesting pulmonary interstitial edema. Superimposed infiltrates difficult to exclude. Review of the MIP images confirms the above findings CTA HEAD FINDINGS Anterior circulation: Petrous segments patent bilaterally. Atheromatous change seen throughout the carotid siphons with associated up to moderate multifocal narrowing, worse on the left. Right A1  segment patent. Left A1 hypoplastic and/or absent, accounting for the diminutive left ICA is compared to the right. 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 ACAs widely patent distally. No M1 stenosis or occlusion. No proximal MCA branch occlusion. Distal MCA branches perfused and fairly symmetric, although demonstrates small vessel atheromatous irregularity. Posterior circulation: Dominant left V4 segment patent without significant stenosis. Multifocal atheromatous irregularity about the diminutive right ICA with up to moderate stenosis. Both PICA patent. Basilar patent to its distal aspect without significant stenosis. Superior cerebral arteries patent bilaterally. Both PCAs primarily supplied via the basilar. Atheromatous irregularity within the left PCA without proximal high-grade stenosis. On the right, there is a focal moderate to severe right P2 stenosis (series 13, image 117). Right PCA otherwise patent to its distal aspect. Venous sinuses: Grossly patent allowing for timing the contrast bolus. Anatomic variants: As above. Review of the MIP images confirms the above findings IMPRESSION: CT HEAD: 1. No acute intracranial abnormality. 2. Aspects equals 10. 3. Age-related cerebral atrophy with moderate chronic microvascular ischemic disease. CTA HEAD AND NECK: 1. Negative CTA for large vessel occlusion or other emergent finding. 2. Atheromatous change about the carotid siphons with associated moderate multifocal narrowing, worse on the left. 3. Short-segment  moderate to severe right P2 stenosis. 4. Atheromatous change about the non dominant right V4 segment with associated moderate multifocal stenoses. 5. 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 6. Layering bilateral pleural effusions with superimposed pulmonary interstitial edema, consistent with CHF. Superimposed infiltrates difficult to exclude,  and could be considered in the correct clinical setting. 7. Asymmetric soft tissue density within the subcutaneous fat overlying the right trapezius muscle, nonspecific, and of uncertain etiology or significance. Correlation with physical exam recommended. Results discussed by telephone at the time of interpretation on 03/06/2022 at 2:49 am to provider ERIC [facility] , who verbally acknowledged these results. Electronically Signed   By: Rise Mu M.D.   On: 03/06/2022 03:34   CT ANGIO HEAD NECK W WO CM (CODE STROKE)  Result Date: 03/06/2022 CLINICAL DATA:  Initial evaluation for neuro deficit, stroke suspected. EXAM: CT ANGIOGRAPHY HEAD AND NECK TECHNIQUE: Multidetector CT imaging of the head and neck was performed using the standard protocol during bolus administration of intravenous contrast. Multiplanar CT image reconstructions and MIPs were obtained to evaluate the vascular anatomy. Carotid stenosis measurements (when applicable) are obtained utilizing NASCET criteria, using the distal internal carotid diameter as the denominator. RADIATION DOSE REDUCTION: This exam was performed according to the departmental dose-optimization program which includes automated exposure control, adjustment of the mA and/or kV according to patient size and/or use of iterative reconstruction technique. CONTRAST:  28mL OMNIPAQUE IOHEXOL 350 MG/ML SOLN COMPARISON:  Prior Study from 09/23/2021. FINDINGS: CT HEAD FINDINGS Brain: Age-related cerebral atrophy with moderate chronic microvascular ischemic disease. Probable small remote lacunar infarct at the posterior left frontal centrum semi ovale. No acute intracranial hemorrhage. No acute large vessel territory infarct. No mass lesion, mass effect or midline shift. No hydrocephalus or extra-axial fluid collection. Vascular: No hyperdense vessel. Calcified atherosclerosis present at skull base. Skull: Scalp soft tissues demonstrate no acute finding. Calvarium intact.  Sinuses/Orbits: Globes orbital soft tissues demonstrate no acute finding. Paranasal sinuses are largely clear. Right mastoid and middle ear effusion, similar to prior, and likely chronic. Other: None. ASPECTS [facility] Stroke Program Early CT Score) - Ganglionic level infarction (caudate, lentiform nuclei, internal capsule, insula, M1-M3 cortex): 7 - Supraganglionic infarction (M4-M6 cortex): 3 Total score (0-10 with 10 being normal): 10 CTA NECK FINDINGS Aortic arch: Aortic arch and origin of the great vessels not included or assessed on this examination. Right carotid system: Visualized right CCA widely patent. Atheromatous irregularity about the right carotid bulb/proximal right ICA without hemodynamically significant greater than 50% stenosis. Right ICA patent distally without stenosis or dissection. Left carotid system: Visualized left CCA widely patent. Atheromatous irregularity about the left carotid bulb/proximal left ICA without hemodynamically significant greater than 50% stenosis. Left ICA patent distally without stenosis or dissection. Vertebral arteries: Both vertebral arteries arise from the subclavian arteries. Vertebral arteries irregular but patent within the neck without stenosis or dissection. Skeleton: No discrete or worrisome osseous lesions. Moderate to advanced multilevel cervical spondylosis, most pronounced at C3-4. Other neck: Endotracheal and enteric tubes in place. No other acute soft tissue abnormality within the neck. 1.2 cm nodular lesion within the subcutaneous fat of the left suboccipital region, nonspecific, but likely a sebaceous cyst (series 10, image 90). Additionally, asymmetric soft tissue density noted within the subcutaneous fat overlying the right trapezius muscle (series 10, image 142), nonspecific. Upper chest: Large layering bilateral pleural effusions, right greater than left, partially visualized. Associated atelectasis. Additional ground-glass opacity with interlobular  septal thickening within the visualized lungs,  conchae suggesting pulmonary interstitial edema. Superimposed infiltrates difficult to exclude. Review of the MIP images confirms the above findings CTA HEAD FINDINGS Anterior circulation: Petrous segments patent bilaterally. Atheromatous change seen throughout the carotid siphons with associated up to moderate multifocal narrowing, worse on the left. Right A1 segment patent. Left A1 hypoplastic and/or absent, accounting for the diminutive left ICA is compared to the right. 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 ACAs widely patent distally. No M1 stenosis or occlusion. No proximal MCA branch occlusion. Distal MCA branches perfused and fairly symmetric, although demonstrates small vessel atheromatous irregularity. Posterior circulation: Dominant left V4 segment patent without significant stenosis. Multifocal atheromatous irregularity about the diminutive right ICA with up to moderate stenosis. Both PICA patent. Basilar patent to its distal aspect without significant stenosis. Superior cerebral arteries patent bilaterally. Both PCAs primarily supplied via the basilar. Atheromatous irregularity within the left PCA without proximal high-grade stenosis. On the right, there is a focal moderate to severe right P2 stenosis (series 13, image 117). Right PCA otherwise patent to its distal aspect. Venous sinuses: Grossly patent allowing for timing the contrast bolus. Anatomic variants: As above. Review of the MIP images confirms the above findings IMPRESSION: CT HEAD: 1. No acute intracranial abnormality. 2. Aspects equals 10. 3. Age-related cerebral atrophy with moderate chronic microvascular ischemic disease. CTA HEAD AND NECK: 1. Negative CTA for large vessel occlusion or other emergent finding. 2. Atheromatous change about the carotid siphons with associated moderate multifocal narrowing, worse  on the left. 3. Short-segment moderate to severe right P2 stenosis. 4. Atheromatous change about the non dominant right V4 segment with associated moderate multifocal stenoses. 5. 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 6. Layering bilateral pleural effusions with superimposed pulmonary interstitial edema, consistent with CHF. Superimposed infiltrates difficult to exclude, and could be considered in the correct clinical setting. 7. Asymmetric soft tissue density within the subcutaneous fat overlying the right trapezius muscle, nonspecific, and of uncertain etiology or significance. Correlation with physical exam recommended. Results discussed by telephone at the time of interpretation on 03/06/2022 at 2:49 am to provider ERIC [facility] , who verbally acknowledged these results. Electronically Signed   By: Jeannine Boga M.D.   On: 03/06/2022 03:34   DG Chest Portable 1 View  Result Date: 03/06/2022 CLINICAL DATA:  Status post intubation EXAM: PORTABLE CHEST 1 VIEW COMPARISON:  02/13/2021 FINDINGS: Cardiac shadow is within normal limits. Endotracheal tube and gastric catheter are noted in satisfactory position. Bibasilar airspace opacities are noted consistent with multifocal pneumonia. No acute bony abnormality is noted. IMPRESSION: Bibasilar airspace opacities consistent with multifocal pneumonia. Tubes and lines in satisfactory position. Electronically Signed   By: Inez Catalina M.D.   On: 03/06/2022 02:39    PHYSICAL EXAM General:  intubated elderly African-American male patient in no acute distress Respiratory:  Respirations synchronous with ventilator Neurological (on sedation with fentanyl): Patient is intubated on ventilator.  Eyes are closed.  He opens eyes partially to sternal rub and follows only commands intermittently with gaze but not with extremities.  PERRL, EOMI, positive oculocephalic reflex, positive cough and gag reflexes.  Antigravity strength in BUE, no  spontaneous movement of BLE but withdraws to noxious stimuli in bilaterally.  ASSESSMENT/PLAN Caleb Cuevas is a 75 y.o. male with history of stroke, atrial fibrillation on anticoagulation, CHF, HLD, HTN, diabetic retinopathy, DM and OSA presenting with acute onset right sided numbness, right sided weakness,  left gaze deviation and aphasia.  EMS was called, he was brought to the hospital and intubated for airway protection.  He was not a candidate for TNK due to Eliquis use, and no LVO was found on CTA.  Stroke: Suspect small left sided stroke in the setting of hypoxic respiratory failure Etiology:  likely cardioembolic in setting of atrial fibrillation on anticoagulation Code Stroke CT head No acute abnormality. Small vessel disease. Atrophy. ASPECTS 10.    CTA head & neck no LVO, moderate to severe left P2 stenosis, right V4 stenosis, 75mm ACA aneurysm MRI  pending 2D Echo pending LDL 108 HgbA1c 6.5 VTE prophylaxis - fully anticoagulated with heparin    Diet   Diet NPO time specified   Eliquis (apixaban) daily prior to admission, now on heparin IV.  Therapy recommendations:  pending Disposition:  pending  Hypertension Home meds:  olmesartan 10 mg daily Stable Permissive hypertension (OK if < 220/120) but gradually normalize in 5-7 days Long-term BP goal normotensive  Hyperlipidemia Home meds:  none LDL 108, goal < 70 Add atorvastatin 40 mg daily  Continue statin at discharge  Diabetes type II Controlled Home meds:  insulin aspart 18 units with meals HgbA1c 6.5, goal < 7.0 CBGs Recent Labs    03/06/22 0637 03/06/22 0824 03/06/22 1119  GLUCAP 150* 171* 190*    SSI  Respiratory failure with community acquired pneumonia Patient was intubated for airway protection Multifocal pneumonia seen on chest xray Ventilator management per CCM Antibiotics per primary team Attempt to extubate soon  Other Stroke Risk Factors Advanced Age >/= 71  Former cigarette  smoker Obesity, Body mass index is 31.66 kg/m., BMI >/= 30 associated with increased stroke risk, recommend weight loss, diet and exercise as appropriate  Hx stroke Obstructive sleep apnea Congestive heart failure  Other Active Problems none  Hospital day # 0  Cortney E Ernestina Columbia , MSN, AGACNP-BC [facility] See Amion for schedule and pager information 03/06/2022 1:11 PM   STROKE MD NOTE :  I have personally obtained history,examined this patient, reviewed notes, independently viewed imaging studies, participated in medical decision making and plan of care.ROS completed by me personally and pertinent positives fully documented  I have made any additions or clarifications directly to the above note. Agree with note above.  Patient presented with hypoxic respiratory failure likely due to pulmonary edema and cardiac issues and was found to be aphasic with right hemiparesis raising concern for left hemispheric infarct.  CT angiogram does not show large vessel occlusion and patient is too unstable at the present time to get an MRI done.  Neurological exam is highly limited due to sedation but there does not appear to be focal extremity weakness.  Recommend wean sedation as tolerated and check MRI scan of the brain if patient is hemodynamically stable enough to be transported for it.  Continue anticoagulation with IV heparin for his A-fib till patient is able to swallow and then resume Eliquis.  Check echocardiogram.  Long discussion with the patient's family at bedside and answered questions.  Discussed with Dr. Melody Comas critical care medicine. This patient is critically ill and at significant risk of neurological worsening, death and care requires constant monitoring of vital signs, hemodynamics,respiratory and cardiac monitoring, extensive review of multiple databases, frequent neurological assessment, discussion with family, other specialists and medical decision making of high  complexity.I have made any additions or clarifications directly to the above note.This critical care time does not reflect procedure time, or  teaching time or supervisory time of PA/NP/Med Resident etc but could involve care discussion time.  I spent 30 minutes of neurocritical care time  in the care of  this patient.     Antony Contras, MD Medical Director [facility] Pager: [phone] 03/06/2022 2:06 PM   To contact Stroke Continuity provider, please refer to [email or web address] After hours, contact General Neurology

## 2022-03-06 NOTE — ED Provider Notes (Signed)
[facility]ERGENCY DEPARTMENT Provider Note  CSN: [id number] Arrival date & time: 03/06/22 9233  Chief Complaint(s) Code Stroke and Respiratory Distress  HPI Caleb Cuevas is a 76 y.o. 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  EMS was called out for shortness of breath.  This began approximately 30 to 40 minutes prior to arrival.  Patient was last seen normal by his wife around 12:45 AM.  Patient was in the living room sitting on the couch and the wife was in the bedroom.  Around 2 AM the patient came into the room stating that he felt short of breath prompting a call to EMS.  EMS noted that the patient was hypoxic to the 50s.  Patient was also noted to be hypotensive with systolics in the 007M and there was question of possible right-sided weakness prompting the code stroke activation.  Wife stated that the patient has been having a dry cough over the past several days.  No fevers.  He does have peripheral edema which is improved from several weeks ago.  He is currently taking antibiotics for left foot wound.  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[place])    bilateral   OSA (obstructive sleep apnea) 02/14/2021   Type II diabetes mellitus ([place])    Patient Active Problem List   Diagnosis Date Noted   Chronic combined systolic and diastolic HF (heart failure)  ([place]) 07/21/2021   Coronary artery disease involving native coronary artery of native heart without angina pectoris 45/62/5638   Diastolic CHF, acute ([place]) 02/14/2021   A-fib ([place]) 02/14/2021   Atrial flutter ([place]) 02/14/2021   Hypoxia 02/14/2021   OSA (obstructive sleep apnea) 02/14/2021   Acute respiratory failure with hypoxia ([place]) 02/14/2021   Respiratory arrest ([place])    Acute pulmonary edema ([place]) 02/13/2021   Narrow complex tachycardia ([place]) 02/13/2021   CKD (chronic kidney disease) stage 3, GFR 30-59 ml/min ([place]) 02/13/2021   New onset of congestive heart failure ([place]) 02/13/2021   Peripheral arterial disease ([place]) 01/02/2020   TIA (transient ischemic attack) 06/18/2019   Polycythemia, 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 Authorizing Provider  insulin glargine-yfgn (SEMGLEE) 100 UNIT/ML Pen Inject 50 Units into the skin at bedtime. 10/27/21   Susy Frizzle, MD  apixaban (ELIQUIS) 5 MG TABS tablet Take 1 tablet (5 mg total) by mouth 2 (two) times daily. 08/29/21   Susy Frizzle, MD  blood glucose meter kit and supplies KIT Dispense based on patient and insurance preference. Use up to four times daily as directed. 09/22/21  Susy Frizzle, MD  Cholecalciferol (VITAMIN D3) 125 MCG (5000 UT) CAPS Take 5,000 Units by mouth daily. 06/29/21   Susy Frizzle, MD  clopidogrel (PLAVIX) 75 MG tablet Take 1 tablet (75 mg total) by mouth daily. 08/29/21   Susy Frizzle, MD  DULoxetine (CYMBALTA) 60 MG capsule Take 1 capsule (60 mg total) by mouth 2 (two) times daily. 10/28/21   Susy Frizzle, MD  insulin aspart (NOVOLOG) 100 UNIT/ML  FlexPen Inject 13 units into the skin in the morning, inject 13 units into the skin at [facility] and 15 units at [employer] 10/20/21   Susy Frizzle, MD  metoprolol succinate (TOPROL-XL) 50 MG 24 hr tablet Take 1 tablet (50 mg total) by mouth 2 (two) times daily. Take with or immediately following a meal. 08/29/21 11/27/21  Susy Frizzle, MD  Multiple Vitamins-Minerals (MULTI-VITAMIN/MINERALS PO) Take 1 tablet by mouth daily. 06/10/07   [provider]  olmesartan (BENICAR) 20 MG tablet Take 0.5 tablets (10 mg total) by mouth daily. 08/29/21   Susy Frizzle, MD  Tiotropium Bromide Monohydrate (SPIRIVA RESPIMAT) 2.5 MCG/ACT AERS Inhale 2 puffs into the lungs daily. 05/19/21   Susy Frizzle, MD                                                                                                                                    Allergies Patient has no known allergies.  Review of Systems Review of Systems As noted in HPI  Physical Exam Vital Signs  I have reviewed the triage vital signs BP (!) 238/112   Pulse 92   Resp 13   Ht 6' (1.829 m)   Wt 105.9 kg   SpO2 (!) 38%   BMI 31.66 kg/m   Physical Exam Vitals reviewed.  Constitutional:      General: He is in acute distress.     Appearance: He is well-developed. He is toxic-appearing and diaphoretic.  HENT:     Head: Normocephalic and atraumatic.     Nose: Nose normal.  Eyes:     General: No scleral icterus.       Right eye: No discharge.        Left eye: No discharge.     Conjunctiva/sclera: Conjunctivae normal.     Pupils: Pupils are equal, round, and reactive to light.  Cardiovascular:     Rate and Rhythm: Normal rate and regular rhythm.     Heart sounds: No murmur heard.    No friction rub. No gallop.  Pulmonary:     Effort: Tachypnea and respiratory distress present.     Breath sounds: No stridor or decreased air movement. No decreased breath sounds.  Abdominal:     General: There is no distension.      Palpations: Abdomen is soft.     Tenderness: There is no abdominal tenderness.  Musculoskeletal:        General: No  tenderness.     Cervical back: Normal range of motion and neck supple.     Right lower leg: 1+ Edema present.     Left lower leg: 2+ Edema present.  Skin:    General: Skin is warm.     Findings: No erythema or rash.  Neurological:     Comments: Patient has left gaze deviation. He is moving all extremities with good strength.     ED Results and Treatments Labs (all labs ordered are listed, but only abnormal results are displayed) Labs Reviewed  PROTIME-INR - Abnormal; Notable for the following components:      Result Value   Prothrombin Time 16.6 (*)    INR 1.4 (*)    All other components within normal limits  CBC - Abnormal; Notable for the following components:   WBC 23.3 (*)    All other components within normal limits  DIFFERENTIAL - Abnormal; Notable for the following components:   Neutro Abs 14.7 (*)    Lymphs Abs 7.7 (*)    All other components within normal limits  COMPREHENSIVE METABOLIC PANEL - Abnormal; Notable for the following components:   Glucose, Bld 185 (*)    BUN 26 (*)    Creatinine, Ser 2.52 (*)    Calcium 8.2 (*)    Albumin 3.0 (*)    GFR, Estimated 26 (*)    All other components within normal limits  BRAIN NATRIURETIC PEPTIDE - Abnormal; Notable for the following components:   B Natriuretic Peptide 253.4 (*)    All other components within normal limits  URINALYSIS, ROUTINE W REFLEX MICROSCOPIC - Abnormal; Notable for the following components:   Protein, ur 100 (*)    Bacteria, UA RARE (*)    All other components within normal limits  I-STAT CHEM 8, ED - Abnormal; Notable for the following components:   BUN 33 (*)    Creatinine, Ser 2.40 (*)    Glucose, Bld 185 (*)    Calcium, Ion 1.10 (*)    All other components within normal limits  CBG MONITORING, ED - Abnormal; Notable for the following components:   Glucose-Capillary 183 (*)     All other components within normal limits  I-STAT ARTERIAL BLOOD GAS, ED - Abnormal; Notable for the following components:   pH, Arterial 7.217 (*)    pCO2 arterial 63.8 (*)    pO2, Arterial 134 (*)    Acid-base deficit 3.0 (*)    All other components within normal limits  TROPONIN I (HIGH SENSITIVITY) - Abnormal; Notable for the following components:   Troponin I (High Sensitivity) 29 (*)    All other components within normal limits  CULTURE, BLOOD (ROUTINE X 2)  CULTURE, BLOOD (ROUTINE X 2)  APTT  ETHANOL  LACTIC ACID, PLASMA  LACTIC ACID, PLASMA  BLOOD GAS, ARTERIAL  TROPONIN I (HIGH SENSITIVITY)  EKG  EKG Interpretation  Date/Time:  Monday March 06 2022 02:53:38 EDT Ventricular Rate:  88 PR Interval:  180 QRS Duration: 85 QT Interval:  387 QTC Calculation: 469 R Axis:   52 Text Interpretation: Sinus rhythm Ventricular premature complex Left atrial enlargement Confirmed by Addison Lank [id number]) on 03/06/2022 3:14:44 AM       Radiology CT HEAD CODE STROKE WO CONTRAST  Result Date: 03/06/2022 CLINICAL DATA:  Initial evaluation for neuro deficit, stroke suspected. EXAM: CT ANGIOGRAPHY HEAD AND NECK TECHNIQUE: Multidetector CT imaging of the head and neck was performed using the standard protocol during bolus administration of intravenous contrast. Multiplanar CT image reconstructions and MIPs were obtained to evaluate the vascular anatomy. Carotid stenosis measurements (when applicable) are obtained utilizing NASCET criteria, using the distal internal carotid diameter as the denominator. RADIATION DOSE REDUCTION: This exam was performed according to the departmental dose-optimization program which includes automated exposure control, adjustment of the mA and/or kV according to patient size and/or use of iterative reconstruction technique. CONTRAST:  35m  OMNIPAQUE IOHEXOL 350 MG/ML SOLN COMPARISON:  Prior Study from 09/23/2021. FINDINGS: CT HEAD FINDINGS Brain: Age-related cerebral atrophy with moderate chronic microvascular ischemic disease. Probable small remote lacunar infarct at the posterior left frontal centrum semi ovale. No acute intracranial hemorrhage. No acute large vessel territory infarct. No mass lesion, mass effect or midline shift. No hydrocephalus or extra-axial fluid collection. Vascular: No hyperdense vessel. Calcified atherosclerosis present at skull base. Skull: Scalp soft tissues demonstrate no acute finding. Calvarium intact. Sinuses/Orbits: Globes orbital soft tissues demonstrate no acute finding. Paranasal sinuses are largely clear. Right mastoid and middle ear effusion, similar to prior, and likely chronic. Other: None. ASPECTS ([facility]) - Ganglionic level infarction (caudate, lentiform nuclei, internal capsule, insula, M1-M3 cortex): 7 - Supraganglionic infarction (M4-M6 cortex): 3 Total score (0-10 with 10 being normal): 10 CTA NECK FINDINGS Aortic arch: Aortic arch and origin of the great vessels not included or assessed on this examination. Right carotid system: Visualized right CCA widely patent. Atheromatous irregularity about the right carotid bulb/proximal right ICA without hemodynamically significant greater than 50% stenosis. Right ICA patent distally without stenosis or dissection. Left carotid system: Visualized left CCA widely patent. Atheromatous irregularity about the left carotid bulb/proximal left ICA without hemodynamically significant greater than 50% stenosis. Left ICA patent distally without stenosis or dissection. Vertebral arteries: Both vertebral arteries arise from the subclavian arteries. Vertebral arteries irregular but patent within the neck without stenosis or dissection. Skeleton: No discrete or worrisome osseous lesions. Moderate to advanced multilevel cervical spondylosis, most  pronounced at C3-4. Other neck: Endotracheal and enteric tubes in place. No other acute soft tissue abnormality within the neck. 1.2 cm nodular lesion within the subcutaneous fat of the left suboccipital region, nonspecific, but likely a sebaceous cyst (series 10, image 90). Additionally, asymmetric soft tissue density noted within the subcutaneous fat overlying the right trapezius muscle (series 10, image 142), nonspecific. Upper chest: Large layering bilateral pleural effusions, right greater than left, partially visualized. Associated atelectasis. Additional ground-glass opacity with interlobular septal thickening within the visualized lungs, conchae suggesting pulmonary interstitial edema. Superimposed infiltrates difficult to exclude. Review of the MIP images confirms the above findings CTA HEAD FINDINGS Anterior circulation: Petrous segments patent bilaterally. Atheromatous change seen throughout the carotid siphons with associated up to moderate multifocal narrowing, worse on the left. Right A1 segment patent. Left A1 hypoplastic and/or absent, accounting for the diminutive left ICA is compared to the right. 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 ACAs widely patent distally. No M1 stenosis or occlusion. No proximal MCA branch occlusion. Distal MCA branches perfused and fairly symmetric, although demonstrates small vessel atheromatous irregularity. Posterior circulation: Dominant left V4 segment patent without significant stenosis. Multifocal atheromatous irregularity about the diminutive right ICA with up to moderate stenosis. Both PICA patent. Basilar patent to its distal aspect without significant stenosis. Superior cerebral arteries patent bilaterally. Both PCAs primarily supplied via the basilar. Atheromatous irregularity within the left PCA without proximal high-grade stenosis. On the right, there is a focal moderate to  severe right P2 stenosis (series 13, image 117). Right PCA otherwise patent to its distal aspect. Venous sinuses: Grossly patent allowing for timing the contrast bolus. Anatomic variants: As above. Review of the MIP images confirms the above findings IMPRESSION: CT HEAD: 1. No acute intracranial abnormality. 2. Aspects equals 10. 3. Age-related cerebral atrophy with moderate chronic microvascular ischemic disease. CTA HEAD AND NECK: 1. Negative CTA for large vessel occlusion or other emergent finding. 2. Atheromatous change about the carotid siphons with associated moderate multifocal narrowing, worse on the left. 3. Short-segment moderate to severe right P2 stenosis. 4. Atheromatous change about the non dominant right V4 segment with associated moderate multifocal stenoses. 5. 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 6. Layering bilateral pleural effusions with superimposed pulmonary interstitial edema, consistent with CHF. Superimposed infiltrates difficult to exclude, and could be considered in the correct clinical setting. 7. Asymmetric soft tissue density within the subcutaneous fat overlying the right trapezius muscle, nonspecific, and of uncertain etiology or significance. Correlation with physical exam recommended. Results discussed by telephone at the time of interpretation on 03/06/2022 at 2:49 am to provider ERIC [facility] , who verbally acknowledged these results. Electronically Signed   By: Jeannine Boga M.D.   On: 03/06/2022 03:34   CT ANGIO HEAD NECK W WO CM (CODE STROKE)  Result Date: 03/06/2022 CLINICAL DATA:  Initial evaluation for neuro deficit, stroke suspected. EXAM: CT ANGIOGRAPHY HEAD AND NECK TECHNIQUE: Multidetector CT imaging of the head and neck was performed using the standard protocol during bolus administration of intravenous contrast. Multiplanar CT image reconstructions and MIPs were obtained to evaluate the vascular anatomy. Carotid stenosis  measurements (when applicable) are obtained utilizing NASCET criteria, using the distal internal carotid diameter as the denominator. RADIATION DOSE REDUCTION: This exam was performed according to the departmental dose-optimization program which includes automated exposure control, adjustment of the mA and/or kV according to patient size and/or use of iterative reconstruction technique. CONTRAST:  48m OMNIPAQUE IOHEXOL 350 MG/ML SOLN COMPARISON:  Prior Study from 09/23/2021. FINDINGS: CT HEAD FINDINGS Brain: Age-related cerebral atrophy with moderate chronic microvascular ischemic disease. Probable small remote lacunar infarct at the posterior left frontal centrum semi ovale. No acute intracranial hemorrhage. No acute large vessel territory infarct. No mass lesion, mass effect or midline shift. No hydrocephalus or extra-axial fluid collection. Vascular: No hyperdense vessel. Calcified atherosclerosis present at skull base. Skull: Scalp soft tissues demonstrate no acute finding. Calvarium intact. Sinuses/Orbits: Globes orbital soft tissues demonstrate no acute finding. Paranasal sinuses are largely clear. Right mastoid and middle ear effusion, similar to prior, and likely chronic. Other: None. ASPECTS ([facility] Early CT Score) - Ganglionic level infarction (caudate, lentiform nuclei, internal capsule, insula, M1-M3 cortex): 7 - Supraganglionic infarction (M4-M6 cortex): 3 Total score (0-10 with 10 being normal): 10 CTA NECK FINDINGS Aortic arch: Aortic  arch and origin of the great vessels not included or assessed on this examination. Right carotid system: Visualized right CCA widely patent. Atheromatous irregularity about the right carotid bulb/proximal right ICA without hemodynamically significant greater than 50% stenosis. Right ICA patent distally without stenosis or dissection. Left carotid system: Visualized left CCA widely patent. Atheromatous irregularity about the left carotid bulb/proximal  left ICA without hemodynamically significant greater than 50% stenosis. Left ICA patent distally without stenosis or dissection. Vertebral arteries: Both vertebral arteries arise from the subclavian arteries. Vertebral arteries irregular but patent within the neck without stenosis or dissection. Skeleton: No discrete or worrisome osseous lesions. Moderate to advanced multilevel cervical spondylosis, most pronounced at C3-4. Other neck: Endotracheal and enteric tubes in place. No other acute soft tissue abnormality within the neck. 1.2 cm nodular lesion within the subcutaneous fat of the left suboccipital region, nonspecific, but likely a sebaceous cyst (series 10, image 90). Additionally, asymmetric soft tissue density noted within the subcutaneous fat overlying the right trapezius muscle (series 10, image 142), nonspecific. Upper chest: Large layering bilateral pleural effusions, right greater than left, partially visualized. Associated atelectasis. Additional ground-glass opacity with interlobular septal thickening within the visualized lungs, conchae suggesting pulmonary interstitial edema. Superimposed infiltrates difficult to exclude. Review of the MIP images confirms the above findings CTA HEAD FINDINGS Anterior circulation: Petrous segments patent bilaterally. Atheromatous change seen throughout the carotid siphons with associated up to moderate multifocal narrowing, worse on the left. Right A1 segment patent. Left A1 hypoplastic and/or absent, accounting for the diminutive left ICA is compared to the right. 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 ACAs widely patent distally. No M1 stenosis or occlusion. No proximal MCA branch occlusion. Distal MCA branches perfused and fairly symmetric, although demonstrates small vessel atheromatous irregularity. Posterior circulation: Dominant left V4 segment patent without significant  stenosis. Multifocal atheromatous irregularity about the diminutive right ICA with up to moderate stenosis. Both PICA patent. Basilar patent to its distal aspect without significant stenosis. Superior cerebral arteries patent bilaterally. Both PCAs primarily supplied via the basilar. Atheromatous irregularity within the left PCA without proximal high-grade stenosis. On the right, there is a focal moderate to severe right P2 stenosis (series 13, image 117). Right PCA otherwise patent to its distal aspect. Venous sinuses: Grossly patent allowing for timing the contrast bolus. Anatomic variants: As above. Review of the MIP images confirms the above findings IMPRESSION: CT HEAD: 1. No acute intracranial abnormality. 2. Aspects equals 10. 3. Age-related cerebral atrophy with moderate chronic microvascular ischemic disease. CTA HEAD AND NECK: 1. Negative CTA for large vessel occlusion or other emergent finding. 2. Atheromatous change about the carotid siphons with associated moderate multifocal narrowing, worse on the left. 3. Short-segment moderate to severe right P2 stenosis. 4. Atheromatous change about the non dominant right V4 segment with associated moderate multifocal stenoses. 5. 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 6. Layering bilateral pleural effusions with superimposed pulmonary interstitial edema, consistent with CHF. Superimposed infiltrates difficult to exclude, and could be considered in the correct clinical setting. 7. Asymmetric soft tissue density within the subcutaneous fat overlying the right trapezius muscle, nonspecific, and of uncertain etiology or significance. Correlation with physical exam recommended. Results discussed by telephone at the time of interpretation on 03/06/2022 at 2:49 am to provider ERIC [facility] , who verbally acknowledged these results. Electronically Signed   By: Jeannine Boga M.D.   On: 03/06/2022 03:34  DG Chest Portable 1  View  Result Date: 03/06/2022 CLINICAL DATA:  Status post intubation EXAM: PORTABLE CHEST 1 VIEW COMPARISON:  02/13/2021 FINDINGS: Cardiac shadow is within normal limits. Endotracheal tube and gastric catheter are noted in satisfactory position. Bibasilar airspace opacities are noted consistent with multifocal pneumonia. No acute bony abnormality is noted. IMPRESSION: Bibasilar airspace opacities consistent with multifocal pneumonia. Tubes and lines in satisfactory position. Electronically Signed   By: Inez Catalina M.D.   On: 03/06/2022 02:39    Pertinent labs & imaging results that were available during my care of the patient were reviewed by me and considered in my medical decision making (see MDM for details).  Medications Ordered in ED Medications  etomidate (AMIDATE) injection (30 mg Intravenous Given 03/06/22 0219)  rocuronium [facility]) injection (100 mg Intravenous Given 03/06/22 0219)  sodium chloride flush (NS) 0.9 % injection 3 mL (has no administration in time range)  propofol (DIPRIVAN) 1000 MG/100ML infusion (has no administration in time range)  cefTRIAXone (ROCEPHIN) 2 g in sodium chloride 0.9 % 100 mL IVPB (has no administration in time range)  azithromycin (ZITHROMAX) 500 mg in sodium chloride 0.9 % 250 mL IVPB (has no administration in time range)  0.9 %  sodium chloride infusion (has no administration in time range)  iohexol (OMNIPAQUE) 350 MG/ML injection 60 mL (60 mLs Intravenous Contrast Given 03/06/22 0253)                                                                                                                                     Procedures Procedure Name: Intubation Date/Time: 03/06/2022 4:02 AM  Performed by: Fatima Blank, MDPre-anesthesia Checklist: Emergency Drugs available, Suction available, Timeout performed, Patient identified and Patient being monitored Oxygen Delivery Method: Ambu bag Induction Type: Rapid sequence Laryngoscope Size:  Glidescope Grade View: Grade I Tube size: 7.5 mm Number of attempts: 1 Airway Equipment and Method: Rigid stylet Placement Confirmation: ETT inserted through vocal cords under direct vision, CO2 detector and Breath sounds checked- equal and bilateral Secured at: 25 cm Tube secured with: ETT holder    .Critical Care  Performed by: Fatima Blank, MD Authorized by: Fatima Blank, MD   Critical care provider statement:    Critical care time (minutes):  45   Critical care time was exclusive of:  Separately billable procedures and treating other patients   Critical care was necessary to treat or prevent imminent or life-threatening deterioration of the following conditions:  Respiratory failure and circulatory failure   Critical care was time spent personally by me on the following activities:  Development of treatment plan with patient or surrogate, discussions with consultants, evaluation of patient's response to treatment, examination of patient, obtaining history from patient or surrogate, review of old charts, re-evaluation of patient's condition, pulse oximetry, ordering and review of radiographic studies, ordering and review of laboratory studies and ordering and performing treatments and interventions  Care discussed with: admitting provider     (including critical care time)  Medical Decision Making / ED Course    Complexity of Problem:  Co-morbidities/SDOH that complicate the patient evaluation/care: Noted above in HPI  Additional history obtained: Wife  Patient's presenting problem/concern, DDX, and MDM listed below: Respiratory distress Patient brought in on BiPAP satting 50% He was immediately intubated for airway protection Given his hypertension and history of CHF, favoring hypertensive emergency with flash pulmonary edema. We will assess for pneumothorax or pneumonia Patient is anticoagulated thus less likely PE  Possible stroke Patient does  have left eye deviation but moving all extremities. We will take patient immediately to CT scan to rule out ICH or LVO. He is not a candidate for TNK given his anticoagulation.  Hospitalization Considered:  Yes    Complexity of Data:   Cardiac Monitoring: The patient was maintained on a cardiac monitor.   I personally viewed and interpreted the cardiac monitored which showed an underlying rhythm of normal sinus rhythm with rates in the 90s EKG without acute ischemic changes, dysrhythmias or blocks.  Laboratory Tests ordered listed below with my independent interpretation: CBC with leukocytosis -other acute stress versus infection. No anemia Metabolic panel without significant electrolyte derangement.  Patient does have AKI. ABG with evidence of respiratory acidosis UA without evidence of infection Lactic acid normal   Imaging Studies ordered listed below with my independent interpretation: Chest x-ray with bilateral opacities most suspicious for pulmonary edema, but possible pneumonia.  No obvious pneumothorax. Radiology reads as multifocal pneumonia CT A head and neck without ICH or large vessel occlusion. Radiology confirmed and noted that pulmonary findings favor edema but cannot rule out pneumonia     ED Course:    Assessment, Add'l Intervention, and Reassessment: Respiratory distress Likely hypertensive emergency/CHF exacerbation Patient does have a low-grade temp with leukocytosis. He was given empiric antibiotics for possible pneumonia Patient's blood pressure is improved on propofol We will need to be treated for CHF exacerbation Possible stroke Dr. Cheral Marker from neurology recommended continued stroke work-up in the hospital  We will call critical care regarding admission.  Final Clinical Impression(s) / ED Diagnoses Final diagnoses:  Acute respiratory failure with hypoxia and hypercapnia (HCC)  Right sided weakness           This chart was dictated using  voice recognition software.  Despite best efforts to proofread,  errors can occur which can change the documentation meaning.    Fatima Blank, MD 03/06/22 [phone]

## 2022-03-06 NOTE — Sepsis Progress Note (Signed)
Following per sepsis protocol   

## 2022-03-06 NOTE — Procedures (Signed)
Patient Name: Caleb Cuevas  MRN: [id number]  Epilepsy Attending: Charlsie Quest  Referring Physician/Provider: Micki Riley, MD  Date: 03/06/2022 Duration: 22.32 mins  Patient history: 76 year old male with atrial fibrillation on Eliquis presenting via EMS with acute hypoxic respiratory failure and right hemiplegia with leftward gaze deviation and expressive aphasia.  EEG to evaluate for seizure.  Level of alertness: Awake, asleep  AEDs during EEG study: None  Technical aspects: This EEG study was done with scalp electrodes positioned according to the 10-20 International system of electrode placement. Electrical activity was reviewed with band pass filter of 1-70Hz , sensitivity of 7 uV/mm, display speed of 13mm/sec with a 60Hz  notched filter applied as appropriate. EEG data were recorded continuously and digitally stored.  Video monitoring was available and reviewed as appropriate.  Description: The posterior dominant rhythm consists of 8Hz  activity of moderate voltage (25-35 uV) seen predominantly in posterior head regions, symmetric and reactive to eye opening and eye closing. Sleep was characterized by vertex waves, sleep spindles (12 to 14 Hz), maximal frontocentral region. Hyperventilation and photic stimulation were not performed.     IMPRESSION: This study is within normal limits. No seizures or epileptiform discharges were seen throughout the recording.  Caleb Cuevas 

## 2022-03-06 NOTE — Progress Notes (Signed)
NAME:  Caleb Cuevas, MRN:  [id number], DOB:  1945-11-30, LOS: 0 ADMISSION DATE:  03/06/2022, CONSULTATION DATE:  03/06/22 REFERRING MD:  EDP, CHIEF COMPLAINT:  hypertensive emergency   History of Present Illness:  76 yo male with pmh afib on chronic eliquis, combined systolic/diastolic hf, h/o htn, hyperlipidemia, dm2, arthritis, h/o cva, ckd 3b admitted with acute hypoxic/hypercarbic respiratory failure secondary to hypertensive emergency and pulmonary edema.   Pt's wife states that he has been in his normal state of health until yesterday when he began complaining of L arm numbess, denied weakness and never has reported any symptoms like this. Pt's wife states he has not complained of any chest pain, sob, dizziness, n/v/d or other symptoms with exception of this evening when he suddenly became sob. Ems noted pt to be hypoxic to the 50's and  hypertensive with sbp in 250's. When he arrived to ED he was emergently intubated for hypoxia. Post intubation hypotension that resolved with propofol being held  When inquired about goals of care as pt is already intubated. Pt's wife stated that they had the discussion one time prior and pt stated that he wanted to be DNR but that she did not agree and wants him resuscitated. I stated to her that we would keep pt a full code but did advise her to speak with other loved ones and consider the patients wishes. Pt's neighbor at bedside as well reiterated that "she is a vet herself as is the pt a purple heart vet and she was bringing him back".  Of note history is limited to chart review and limited information from wife and neighbor at bedside. 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   Pertinent  Medical History  HF systolic/diastolic Afib Hyperlipidemia Osa H/o cva ckd3b  Significant Hospital Events: Including procedures, antibiotic start and stop dates in addition to other pertinent events   Intubated 7/31, admitted to ICU  Interim History /  Subjective:  As above.  Objective   Blood pressure (!) 141/65, pulse 65, temperature 98.8 F (37.1 C), resp. rate 18, height 6' (1.829 m), weight 105.9 kg, SpO2 100 %.    Vent Mode: PRVC FiO2 (%):  [60 %] 60 % Set Rate:  [18 bmp] 18 bmp Vt Set:  [620 mL] 620 mL PEEP:  [5 cmH20] 5 cmH20 Plateau Pressure:  [22 cmH20-23 cmH20] 23 cmH20  No intake or output data in the 24 hours ending 03/06/22 0717 Filed Weights   03/06/22 0342  Weight: 105.9 kg    Examination: General: sedated and intubated, no distress HENT: ncat, perrla, non icteric but injected sclera Lungs: crackles at bases, symmetric lung expansion, synchronous with ventilator Cardiovascular: irregular rate and rhythm, cool extremities, + lower extremity edema  Abdomen: obsese, non tender, non distended.  Extremities: L food with dorsal aspect wound Neuro: Eyes open to voice, PERRL, EOMI to command, does not move extremities to command but localizes in extremities and appears equal   Resolved Hospital Problem list     Assessment & Plan:  Acute hypoxic/hypercarbic resp failure:  -2/2 htn emergency -RPP negative, Respiratory culture, Strep/legionella pending  -cannot exclude underlying pneumonia: CAP coverage with Rocephin/Azithro  -Resume home spiriva  -Diuresis  -Continue full vent support with lung protective strategies, vent settings adjusted after last ABG. Will repeat ABG given ongoing encephalopathy to ensure CO2 improving -Sedation: Fentanyl infusion with PRN versed  Osa:  -does sleep in recliner or with 2pillow orthopnea on couch  Acute metabolic  encephalopathy:  -Neurology evaluated in the ED -CTH with stroke protocol negative for acute intracranial process  -Moving all extremities but not following commands consistently on exam.  -Hopeful encephalopathy will improve with downtrending Co2 and minimizing sedation.  Consider MRI Brain if no improvement with resolution of hypercarbia.   Htn emergency:   -presented with SBP in 250's.  -Goal SBP < 150 -Hold home olmesartan and metoprolol this morning, at goal blood pressure  -PRN hydralazine available  Acute on chronic systolic/diastolic heart failure:  Nonobstructive coronary artery disease  -last EF 45-50% with grade 3 diastolic dysfunction -repeat TTE -Diuresis  -Follow up lactate at 1100  Elevated trop:  -EKG without ST changes  Afib:  -on eliquis chronically, hold for now should any procedures be necessary -consider resuming or heparin infusion if needed to pause resumption  Hyperlipidemia:  -statin held earlier this year due to elevated LFTs  Dm2 with hyperglycemia:  -a1c 6.7 -glucose controlled with ssi  L foot wound:  -cont abx -wound care consult  Acute on Ckd 3b:  -baseline creatinine: 1.9-2.4  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, non ruptured -Unchanged from previous scan -Goal SBP < 150  Depression -Home duloxetine held, resume as neurological exam starts to improve   Best Practice (right click and "Reselect all SmartList Selections" daily)   Diet/type: consult nutrition for tubefeeds DVT prophylaxis: SCD, start anticoagulation GI prophylaxis: PPI Lines: N/A Foley:  Yes, and it is still needed Code Status:  full code Last date of multidisciplinary goals of care discussion [7/30]  Labs   CBC: Recent Labs  Lab 03/06/22 0224 03/06/22 0231 03/06/22 0319  WBC 23.3*  --   --   NEUTROABS 14.7*  --   --   HGB 14.0 15.6 13.3  HCT 45.0 46.0 39.0  MCV 88.1  --   --   PLT 224  --   --      Basic Metabolic Panel: Recent Labs  Lab 03/06/22 0224 03/06/22 0231 03/06/22 0319  NA 139 141 140  K 4.1 4.0 4.1  CL 107 105  --   CO2 23  --   --   GLUCOSE 185* 185*  --   BUN 26* 33*  --   CREATININE 2.52* 2.40*  --   CALCIUM 8.2*  --   --     GFR: Estimated Creatinine Clearance: 33.4 mL/min (A) (by C-G formula based on SCr of 2.4 mg/dL (H)). Recent Labs  Lab 03/06/22 0224  03/06/22 0253 03/06/22 0535  WBC 23.3*  --   --   LATICACIDVEN  --  1.8 2.6*     Liver Function Tests: Recent Labs  Lab 03/06/22 0224  AST 26  ALT 23  ALKPHOS 77  BILITOT 0.9  PROT 7.1  ALBUMIN 3.0*    No results for input(s): "LIPASE", "AMYLASE" in the last 168 hours. No results for input(s): "AMMONIA" in the last 168 hours.  ABG    Component Value Date/Time   PHART 7.217 (L) 03/06/2022 0319   PCO2ART 63.8 (H) 03/06/2022 0319   PO2ART 134 (H) 03/06/2022 0319   HCO3 26.4 03/06/2022 0319   TCO2 28 03/06/2022 0319   ACIDBASEDEF 3.0 (H) 03/06/2022 0319   O2SAT 98 03/06/2022 0319     Coagulation Profile: Recent Labs  Lab 03/06/22 0224  INR 1.4*     Cardiac Enzymes: No results for input(s): "CKTOTAL", "CKMB", "CKMBINDEX", "TROPONINI" in the last 168 hours.  HbA1C: Hgb A1c MFr Bld  Date/Time Value Ref Range  Status  02/25/2021 03:55 PM 6.7 (H) <5.7 % of total Hgb Final    Comment:    For someone without known diabetes, a hemoglobin A1c value of 6.5% or greater indicates that they may have  diabetes and this should be confirmed with a follow-up  test. . For someone with known diabetes, a value <7% indicates  that their diabetes is well controlled and a value  greater than or equal to 7% indicates suboptimal  control. A1c targets should be individualized based on  duration of diabetes, age, comorbid conditions, and  other considerations. . Currently, no consensus exists regarding use of hemoglobin A1c for diagnosis of diabetes for children. .   02/15/2021 03:36 AM 6.8 (H) 4.8 - 5.6 % Final    Comment:    (NOTE) Pre diabetes:          5.7%-6.4%  Diabetes:              >6.4%  Glycemic control for   <7.0% adults with diabetes     CBG: Recent Labs  Lab 03/06/22 0218  GLUCAP 183*       Critical care time: 40 min    Earney Hamburg, ACNP Secure chat available [facility] Office: [phone] PCCM on call pager [phone] until 7pm. Please call Elink 7p-7a. [phone]

## 2022-03-06 NOTE — Progress Notes (Signed)
EEG complete - results pending 

## 2022-03-06 NOTE — Progress Notes (Signed)
ANTICOAGULATION CONSULT NOTE  Pharmacy Consult for Heparin Indication: atrial fibrillation  No Known Allergies  Patient Measurements: Height: 6' (182.9 cm) Weight: 105.9 kg (233 lb 7.5 oz) IBW/kg (Calculated) : 77.6 Heparin Dosing Weight: 100 kg  Vital Signs: Temp: 99.9 F (37.7 C) (07/31 1845) BP: 173/61 (07/31 1845) Pulse Rate: 90 (07/31 1845)  Labs: Recent Labs    03/06/22 0224 03/06/22 0231 03/06/22 0319 03/06/22 0537 03/06/22 0810 03/06/22 1004 03/06/22 1156 03/06/22 1742  HGB 14.0 15.6 13.3  --   --  12.2*  --   --   HCT 45.0 46.0 39.0  --   --  36.0*  --   --   PLT 224  --   --   --   --   --   --   --   APTT 31  --   --   --   --   --   --  83*  LABPROT 16.6*  --   --   --   --   --   --   --   INR 1.4*  --   --   --   --   --   --   --   HEPARINUNFRC  --   --   --   --   --   --   --  >1.10*  CREATININE 2.52* 2.40*  --   --   --   --   --   --   TROPONINIHS 29*  --   --  31* 45*  --  55*  --      Estimated Creatinine Clearance: 33.4 mL/min (A) (by C-G formula based on SCr of 2.4 mg/dL (H)).  Assessment: 76 y.o. M presents with hypoxemia and hypertensive emergency. Pt on apixaban PTA for afib/h/o CVA. Unsure timing of last dose but at least 12 hours ago.  Eliquis likely affecting heparin level; therefore, will utilize aPTT monitoring until levels are correlating.  aPP therapeutic and heparin level is elevated as expected.  No issue reported.  Goal of Therapy:  Heparin level 0.3-0.7 units/ml, aPTT 66-102 sec Monitor platelets by anticoagulation protocol: Yes   Plan:  Continue heparin infusion at 1400 units/hr Daily aPTT, heparin level, and CBC  Candi Profit D. Laney Potash, PharmD, BCPS, BCCCP 03/06/2022, 6:47 PM

## 2022-03-07 ENCOUNTER — Inpatient Hospital Stay (HOSPITAL_COMMUNITY): Payer: No Typology Code available for payment source

## 2022-03-07 ENCOUNTER — Other Ambulatory Visit (HOSPITAL_COMMUNITY): Payer: Self-pay

## 2022-03-07 DIAGNOSIS — I161 Hypertensive emergency: Secondary | ICD-10-CM | POA: Diagnosis not present

## 2022-03-07 LAB — CBC
HCT: 34.5 % — ABNORMAL LOW (ref 39.0–52.0)
Hemoglobin: 11.4 g/dL — ABNORMAL LOW (ref 13.0–17.0)
MCH: 27.3 pg (ref 26.0–34.0)
MCHC: 33 g/dL (ref 30.0–36.0)
MCV: 82.7 fL (ref 80.0–100.0)
Platelets: 142 10*3/uL — ABNORMAL LOW (ref 150–400)
RBC: 4.17 MIL/uL — ABNORMAL LOW (ref 4.22–5.81)
RDW: 14.5 % (ref 11.5–15.5)
WBC: 13.2 10*3/uL — ABNORMAL HIGH (ref 4.0–10.5)
nRBC: 0 % (ref 0.0–0.2)

## 2022-03-07 LAB — BASIC METABOLIC PANEL
Anion gap: 11 (ref 5–15)
BUN: 53 mg/dL — ABNORMAL HIGH (ref 8–23)
CO2: 20 mmol/L — ABNORMAL LOW (ref 22–32)
Calcium: 8.3 mg/dL — ABNORMAL LOW (ref 8.9–10.3)
Chloride: 109 mmol/L (ref 98–111)
Creatinine, Ser: 2.84 mg/dL — ABNORMAL HIGH (ref 0.61–1.24)
GFR, Estimated: 22 mL/min — ABNORMAL LOW (ref >60)
Glucose, Bld: 252 mg/dL — ABNORMAL HIGH (ref 70–99)
Potassium: 4.4 mmol/L (ref 3.5–5.1)
Sodium: 140 mmol/L (ref 135–145)

## 2022-03-07 LAB — GLUCOSE, CAPILLARY
Glucose-Capillary: 257 mg/dL — ABNORMAL HIGH (ref 70–99)
Glucose-Capillary: 212 mg/dL — ABNORMAL HIGH (ref 70–99)
Glucose-Capillary: 142 mg/dL — ABNORMAL HIGH (ref 70–99)
Glucose-Capillary: 39 mg/dL — CL (ref 70–99)
Glucose-Capillary: 43 mg/dL — CL (ref 70–99)
Glucose-Capillary: 127 mg/dL — ABNORMAL HIGH (ref 70–99)
Glucose-Capillary: 45 mg/dL — ABNORMAL LOW (ref 70–99)
Glucose-Capillary: 96 mg/dL (ref 70–99)

## 2022-03-07 LAB — APTT: aPTT: 75 seconds — ABNORMAL HIGH (ref 24–36)

## 2022-03-07 LAB — TRIGLYCERIDES: Triglycerides: 100 mg/dL (ref <150)

## 2022-03-07 LAB — LIPID PANEL
Cholesterol: 169 mg/dL (ref 0–200)
HDL: 34 mg/dL — ABNORMAL LOW (ref >40)
LDL Cholesterol: 115 mg/dL — ABNORMAL HIGH (ref 0–99)
Total CHOL/HDL Ratio: 5 RATIO
Triglycerides: 101 mg/dL (ref <150)
VLDL: 20 mg/dL (ref 0–40)

## 2022-03-07 LAB — MAGNESIUM: Magnesium: 2.1 mg/dL (ref 1.7–2.4)

## 2022-03-07 LAB — HEPARIN LEVEL (UNFRACTIONATED): Heparin Unfractionated: >1.1 IU/mL — ABNORMAL HIGH (ref 0.30–0.70)

## 2022-03-07 LAB — LEGIONELLA PNEUMOPHILA SEROGP 1 UR AG: L. pneumophila Serogp 1 Ur Ag: NEGATIVE

## 2022-03-07 LAB — PHOSPHORUS: Phosphorus: 3.9 mg/dL (ref 2.5–4.6)

## 2022-03-07 MED ORDER — STROKE: EARLY STAGES OF RECOVERY BOOK
Freq: Once | Status: AC
  Filled 2022-03-07: qty 1

## 2022-03-07 MED ORDER — PANTOPRAZOLE SODIUM 40 MG PO TBEC
40.0000 mg | DELAYED_RELEASE_TABLET | Freq: Every day | ORAL | Status: DC
  Administered 2022-03-08 – 2022-03-09 (×2): 40 mg via ORAL
  Filled 2022-03-07 (×2): qty 1

## 2022-03-07 MED ORDER — ATORVASTATIN CALCIUM 40 MG PO TABS
40.0000 mg | ORAL_TABLET | Freq: Every day | ORAL | Status: DC
  Administered 2022-03-08 – 2022-03-09 (×2): 40 mg via ORAL
  Filled 2022-03-07 (×2): qty 1

## 2022-03-07 MED ORDER — DEXTROSE 50 % IV SOLN: 1.0000 | Freq: Once | INTRAVENOUS | Status: AC

## 2022-03-07 MED ORDER — DEXTROSE 50 % IV SOLN
INTRAVENOUS | Status: AC
  Administered 2022-03-07: 50 mL via INTRAVENOUS
  Filled 2022-03-07: qty 50

## 2022-03-07 MED ORDER — FUROSEMIDE 10 MG/ML IJ SOLN
60.0000 mg | Freq: Two times a day (BID) | INTRAMUSCULAR | Status: AC
  Administered 2022-03-07 (×2): 60 mg via INTRAVENOUS
  Filled 2022-03-07 (×2): qty 6

## 2022-03-07 MED ORDER — DOCUSATE SODIUM 100 MG PO CAPS
100.0000 mg | ORAL_CAPSULE | Freq: Two times a day (BID) | ORAL | Status: DC | PRN
Start: 2022-03-07 — End: 2022-03-09

## 2022-03-07 MED ORDER — POLYETHYLENE GLYCOL 3350 17 G PO PACK: 17.0000 g | PACK | Freq: Every day | ORAL | Status: DC | PRN

## 2022-03-07 MED ORDER — DULOXETINE HCL 60 MG PO CPEP
60.0000 mg | ORAL_CAPSULE | Freq: Two times a day (BID) | ORAL | Status: DC
  Administered 2022-03-07 – 2022-03-09 (×5): 60 mg via ORAL
  Filled 2022-03-07 (×5): qty 1

## 2022-03-07 NOTE — Progress Notes (Signed)
ANTICOAGULATION CONSULT NOTE  Pharmacy Consult for Heparin Indication: atrial fibrillation  No Known Allergies  Patient Measurements: Height: 6' (182.9 cm) Weight: 103.1 kg (227 lb 4.7 oz) IBW/kg (Calculated) : 77.6 Heparin Dosing Weight: 100 kg  Vital Signs: Temp: 99.5 F (37.5 C) (08/01 0729) Temp Source: Bladder (08/01 0729) BP: 162/74 (08/01 0400) Pulse Rate: 68 (08/01 0400)  Labs: Recent Labs    03/06/22 0224 03/06/22 0231 03/06/22 0319 03/06/22 0537 03/06/22 0810 03/06/22 1004 03/06/22 1156 03/06/22 1742 03/07/22 0714  HGB 14.0 15.6 13.3  --   --  12.2*  --   --  11.4*  HCT 45.0 46.0 39.0  --   --  36.0*  --   --  34.5*  PLT 224  --   --   --   --   --   --   --  142*  APTT 31  --   --   --   --   --   --  83* 75*  LABPROT 16.6*  --   --   --   --   --   --   --   --   INR 1.4*  --   --   --   --   --   --   --   --   HEPARINUNFRC  --   --   --   --   --   --   --  >1.10* >1.10*  CREATININE 2.52* 2.40*  --   --   --   --   --   --  2.84*  TROPONINIHS 29*  --   --  31* 45*  --  55*  --   --      Estimated Creatinine Clearance: 27.9 mL/min (A) (by C-G formula based on SCr of 2.84 mg/dL (H)).  Assessment: 76 y.o. M presents with hypoxemia and hypertensive emergency. Pt on apixaban PTA for afib/h/o CVA. Unsure timing of last dose but at least 12 hours ago.  Eliquis affecting heparin level; therefore, will utilize aPTT monitoring until levels are correlating.  aPTT therapeutic and heparin level elevated as expected.  No bleeding noted.  Goal of Therapy:  Heparin level 0.3-0.7 units/ml, aPTT 66-102 sec Monitor platelets by anticoagulation protocol: Yes   Plan:  Continue heparin infusion at 1400 units/hr Daily aPTT, heparin level, and CBC  Christoper Fabian, PharmD, BCPS Please see amion for complete clinical pharmacist phone list 03/07/2022, 9:47 AM

## 2022-03-07 NOTE — Progress Notes (Addendum)
NAME:  Caleb Cuevas, MRN:  [id number], DOB:  [birth year], LOS: 1 ADMISSION DATE:  03/06/2022, CONSULTATION DATE:  03/06/22 REFERRING MD:  EDP, CHIEF COMPLAINT:  hypertensive emergency   History of Present Illness:  76 yo male with pmh afib on chronic eliquis, combined systolic/diastolic hf, h/o htn, hyperlipidemia, dm2, arthritis, h/o cva, ckd 3b admitted with acute hypoxic/hypercarbic respiratory failure secondary to hypertensive emergency and pulmonary edema.   Pt's wife states that he has been in his normal state of health until yesterday when he began complaining of L arm numbess, denied weakness and never has reported any symptoms like this. Pt's wife states he has not complained of any chest pain, sob, dizziness, n/v/d or other symptoms with exception of this evening when he suddenly became sob. Ems noted pt to be hypoxic to the 50's and  hypertensive with sbp in 250's. When he arrived to ED he was emergently intubated for hypoxia. Post intubation hypotension that resolved with propofol being held  When inquired about goals of care as pt is already intubated. Pt's wife stated that they had the discussion one time prior and pt stated that he wanted to be DNR but that she did not agree and wants him resuscitated. I stated to her that we would keep pt a full code but did advise her to speak with other loved ones and consider the patients wishes. Pt's neighbor at bedside as well reiterated that "she is a vet herself as is the pt a purple heart vet and she was bringing him back".  Of note history is limited to chart review and limited information from wife and neighbor at bedside. 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   Pertinent  Medical History  HF systolic/diastolic Afib Hyperlipidemia Osa H/o cva ckd3b  Significant Hospital Events: Including procedures, antibiotic start and stop dates in addition to other pertinent events   Intubated 7/31, admitted to ICU 03/07/2022 MRI head  demonstrated multiple infarcts  Interim History / Subjective:  Just returned from MRI is heavily sedated no acute distress does not follow commands  Objective   Blood pressure (!) 162/74, pulse 68, temperature 99.5 F (37.5 C), temperature source Bladder, resp. rate 18, height 6' (1.829 m), weight 103.1 kg, SpO2 98 %.    Vent Mode: PRVC FiO2 (%):  [30 %-50 %] 30 % Set Rate:  [18 bmp] 18 bmp Vt Set:  [620 mL] 620 mL PEEP:  [5 cmH20] 5 cmH20 Plateau Pressure:  [18 cmH20-20 cmH20] 20 cmH20   Intake/Output Summary (Last 24 hours) at 03/07/2022 0759 Last data filed at 03/07/2022 0700 Gross per 24 hour  Intake 2667.47 ml  Output 2535 ml  Net 132.47 ml   Filed Weights   03/06/22 0342 03/07/22 0500  Weight: 105.9 kg 103.1 kg    Examination: General: Elderly male who is either agitated or sedated HEENT: MM pink/moist orogastric tube is in place, endotracheal tube is in place.  Pupils are approximately 3 mm poorly reactive disconjugate gaze Neuro: Either agitated or sedated.  Noninteractive does not follow commands CV: Heart sounds are currently regular PULM: Mild rhonchi Vent pressure regulated volume control no evidence of air trapping FIO2 40% PEEP 5 RATE 18 with no overbreathing VT 8 cc/kg  GI: soft, bsx4 active tube feedings currently on hold GU: Amber urine Extremities: warm/dry, 1+ edema  Skin: no rashes or lesions lower extremity all cultures are negative    Resolved Hospital Problem list     Assessment &  Plan:  Acute hypoxic/hypercarbic resp failure in the setting of hypertensive emergency questionable underlying pneumonia.  Assess for weaning today with decrease in sedation and change respiratory wean as tolerated. Diuresis as tolerated Control hypertension Bronchodilators Monitor arterial blood gases Notes MRI of the head completed 03/07/2022 reveals small acute infarct below the left insula.  Small subacute infarct at the corpus callosum body.  And chronic small  vessel ischemia in the cerebral white matter all of which may impede his weaning process His underlying OSA will also repeat his extubation May need extubation to BiPAP.  Osa: Sleeps in a recliner CPAP in future  Acute metabolic encephalopathy: Evaluated by neurology No MRI findings Continue to wean sedation further evaluate mental status  Htn emergency: Goal systolic blood pressure less than 160 Continue antihypertensives Continue diuresis  Acute on chronic systolic/diastolic heart failure:  Nonobstructive coronary artery disease  -last EF 45-50% with grade 3 diastolic dysfunction  Intake/Output Summary (Last 24 hours) at 03/07/2022 0811 Last data filed at 03/07/2022 0700 Gross per 24 hour  Intake 2359.22 ml  Output 2260 ml  Net 99.22 ml   Repeat Lasix Monitor I&O       Afib: On chronic Eliquis Continue heparin for now Reinstate Eliquis in the near future   Dm2 with hyperglycemia:  CBG (last 3)  Recent Labs    03/06/22 2348 03/07/22 0332 03/07/22 0726  GLUCAP 388* 257* 212*    Sliding-scale insulin protocol  L foot wound:  Antibiotics   Acute on Ckd 3b:  Lab Results  Component Value Date   CREATININE 2.40 (H) 03/06/2022   CREATININE 2.52 (H) 03/06/2022   CREATININE 1.92 (H) 05/16/2021   CREATININE 2.20 (H) 04/25/2021   CREATININE 1.99 (H) 04/22/2021   CREATININE 1.88 (H) 02/25/2021    Avoid nephrotoxins  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, non ruptured Continue to keep pressure less than 160   Depression Continue to hold Cymbalta  Best Practice (right click and "Reselect all SmartList Selections" daily)   Diet/type: consult nutrition for tubefeeds DVT prophylaxis: systemic heparin, start anticoagulation GI prophylaxis: PPI Lines: N/A Foley:  Yes, and it is still needed Code Status:  full code Last date of multidisciplinary goals of care discussion [7/30]  Labs   CBC: Recent Labs  Lab 03/06/22 0224 03/06/22 0231  03/06/22 0319 03/06/22 1004 03/07/22 0714  WBC 23.3*  --   --   --  13.2*  NEUTROABS 14.7*  --   --   --   --   HGB 14.0 15.6 13.3 12.2* 11.4*  HCT 45.0 46.0 39.0 36.0* 34.5*  MCV 88.1  --   --   --  82.7  PLT 224  --   --   --  142*    Basic Metabolic Panel: Recent Labs  Lab 03/06/22 0224 03/06/22 0231 03/06/22 0319 03/06/22 1004  NA 139 141 140 138  K 4.1 4.0 4.1 4.5  CL 107 105  --   --   CO2 23  --   --   --   GLUCOSE 185* 185*  --   --   BUN 26* 33*  --   --   CREATININE 2.52* 2.40*  --   --   CALCIUM 8.2*  --   --   --    GFR: Estimated Creatinine Clearance: 33 mL/min (A) (by C-G formula based on SCr of 2.4 mg/dL (H)). Recent Labs  Lab 03/06/22 0224 03/06/22 0253 03/06/22 0535 03/06/22 0810 03/06/22 1156 03/07/22 [id number]  PROCALCITON  --   --   --  1.34  --   --   WBC 23.3*  --   --   --   --  13.2*  LATICACIDVEN  --  1.8 2.6*  --  2.4*  --     Liver Function Tests: Recent Labs  Lab 03/06/22 0224  AST 26  ALT 23  ALKPHOS 77  BILITOT 0.9  PROT 7.1  ALBUMIN 3.0*   No results for input(s): "LIPASE", "AMYLASE" in the last 168 hours. No results for input(s): "AMMONIA" in the last 168 hours.  ABG    Component Value Date/Time   PHART 7.530 (H) 03/06/2022 1004   PCO2ART 25.9 (L) 03/06/2022 1004   PO2ART 201 (H) 03/06/2022 1004   HCO3 21.7 03/06/2022 1004   TCO2 22 03/06/2022 1004   ACIDBASEDEF 3.0 (H) 03/06/2022 0319   O2SAT 100 03/06/2022 1004     Coagulation Profile: Recent Labs  Lab 03/06/22 0224  INR 1.4*    Cardiac Enzymes: No results for input(s): "CKTOTAL", "CKMB", "CKMBINDEX", "TROPONINI" in the last 168 hours.  HbA1C: Hgb A1c MFr Bld  Date/Time Value Ref Range Status  03/06/2022 08:10 AM 6.5 (H) 4.8 - 5.6 % Final    Comment:    (NOTE) Pre diabetes:          5.7%-6.4%  Diabetes:              >6.4%  Glycemic control for   <7.0% adults with diabetes   02/25/2021 03:55 PM 6.7 (H) <5.7 % of total Hgb Final    Comment:    For  someone without known diabetes, a hemoglobin A1c value of 6.5% or greater indicates that they may have  diabetes and this should be confirmed with a follow-up  test. . For someone with known diabetes, a value <7% indicates  that their diabetes is well controlled and a value  greater than or equal to 7% indicates suboptimal  control. A1c targets should be individualized based on  duration of diabetes, age, comorbid conditions, and  other considerations. . Currently, no consensus exists regarding use of hemoglobin A1c for diagnosis of diabetes for children. .     CBG: Recent Labs  Lab 03/06/22 1530 03/06/22 1942 03/06/22 2348 03/07/22 0332 03/07/22 0726  GLUCAP 261* 305* 388* 257* 212*      Critical care time: 30 min

## 2022-03-07 NOTE — Procedures (Signed)
Extubation Procedure Note  Patient Details:   Name: Caleb Cuevas DOB: [birth year] MRN: [id number]   Airway Documentation:    Vent end date: 03/07/22 Vent end time: 1012   Evaluation  O2 sats: stable throughout Complications: No apparent complications Patient did tolerate procedure well. Bilateral Breath Sounds: Clear, Diminished   Patient extubated per MD order & placed on 4L [place]. Patient is able to speak & cough post extubation. Patient had positive cuff leak prior to extubation.  Jacqulynn Cadet 03/07/2022, 10:20 AM

## 2022-03-07 NOTE — Progress Notes (Signed)
STROKE TEAM PROGRESS NOTE   INTERVAL HISTORY Patient was extubated this morning and is breathing well.  He is awake alert and interactive.  His speech is slightly hesitant but he is able to speak clearly and follow commands well.  He moves all 4 extremities well against gravity without focal weakness.  Vital signs are stable. MRI scan of the brain shows tiny punctate left subinsular and subacute right corpus callosum infarcts Vitals:   03/07/22 1100 03/07/22 1136 03/07/22 1153 03/07/22 1200  BP: (!) 141/63   (!) 144/63  Pulse: 71  74 73  Resp: 14  18 14   Temp: 98.4 F (36.9 C) 98.2 F (36.8 C) 98.2 F (36.8 C) 98.2 F (36.8 C)  TempSrc:  Bladder    SpO2: 100%  100% 100%  Weight:      Height:       CBC:  Recent Labs  Lab 03/06/22 0224 03/06/22 0231 03/06/22 1004 03/07/22 0714  WBC 23.3*  --   --  13.2*  NEUTROABS 14.7*  --   --   --   HGB 14.0   < > 12.2* 11.4*  HCT 45.0   < > 36.0* 34.5*  MCV 88.1  --   --  82.7  PLT 224  --   --  142*   < > = values in this interval not displayed.   Basic Metabolic Panel:  Recent Labs  Lab 03/06/22 0224 03/06/22 0231 03/06/22 0319 03/06/22 1004 03/07/22 0714  NA 139 141   < > 138 140  K 4.1 4.0   < > 4.5 4.4  CL 107 105  --   --  109  CO2 23  --   --   --  20*  GLUCOSE 185* 185*  --   --  252*  BUN 26* 33*  --   --  53*  CREATININE 2.52* 2.40*  --   --  2.84*  CALCIUM 8.2*  --   --   --  8.3*  MG  --   --   --   --  2.1  PHOS  --   --   --   --  3.9   < > = values in this interval not displayed.   Lipid Panel:  Recent Labs  Lab 03/07/22 0714  CHOL 169  TRIG 101  100  HDL 34*  CHOLHDL 5.0  VLDL 20  LDLCALC 05/07/22*   HgbA1c:  Recent Labs  Lab 03/06/22 0810  HGBA1C 6.5*   Urine Drug Screen: No results for input(s): "LABOPIA", "COCAINSCRNUR", "LABBENZ", "AMPHETMU", "THCU", "LABBARB" in the last 168 hours.  Alcohol Level  Recent Labs  Lab 03/06/22 0224  ETH <10    IMAGING past 24 hours DG Abd Portable  1V  Result Date: 03/07/2022 CLINICAL DATA:  Possible metal prior to MRI. EXAM: PORTABLE ABDOMEN - 1 VIEW COMPARISON:  None Available. FINDINGS: The bowel gas pattern is normal. Nasogastric tube tip is seen in expected position of distal stomach. Probable small left renal calculus is noted. No other definite radiopaque foreign body is noted. IMPRESSION: Probable small left renal calculus. Nasogastric tube tip seen in expected position of distal stomach. Electronically Signed   By: 05/07/2022 M.D.   On: 03/07/2022 08:16   MR BRAIN WO CONTRAST  Result Date: 03/07/2022 CLINICAL DATA:  Acute onset shortness of breath. EXAM: MRI HEAD WITHOUT CONTRAST TECHNIQUE: Multiplanar, multiecho pulse sequences of the brain and surrounding structures were obtained without intravenous contrast. COMPARISON:  CT and CTA from yesterday FINDINGS: Brain: Small acute infarct below the left insula. Weakly restricted diffusion at the right corpus callosum body. Ischemic gliosis in the cerebral white matter and to a limited degree in the brainstem. No hemorrhage, hydrocephalus, or collection. Calcification or chronic blood products in the left occipital white matter, nonspecific in isolation. No acute hemorrhage, hydrocephalus, or masslike finding. Vascular: Major flow voids are preserved Skull and upper cervical spine: Normal marrow signal Sinuses/Orbits: Negative.  Bilateral cataract resection. IMPRESSION: 1. Small acute infarct below the left insula. Small subacute infarct at the corpus callosum body. 2. Mild chronic small vessel ischemia in the cerebral white matter. Electronically Signed   By: Tiburcio Pea M.D.   On: 03/07/2022 07:26   DG Chest Port 1 View  Result Date: 03/07/2022 CLINICAL DATA:  76 year old male with history of acute hypoxic respiratory failure. EXAM: PORTABLE CHEST 1 VIEW COMPARISON:  Chest x-ray 03/06/2022. FINDINGS: An endotracheal tube is in place with tip 4.2 cm above the carina. A nasogastric tube is  seen extending into the stomach, however, the tip of the nasogastric tube extends below the lower margin of the image. Lung volumes are slightly low. Widespread areas of interstitial prominence and ill-defined opacities are evident throughout the mid to lower lungs bilaterally, particularly in the lung bases where there is extensive atelectasis and/or consolidation. Complete obscuration of the left hemidiaphragm and blunting the left costophrenic sulcus indicative of a small to moderate left pleural effusion. No definite right pleural effusion. Pulmonary vasculature is largely obscured but does not appear engorged. Heart size is normal. Upper mediastinal contours are within normal limits. IMPRESSION: 1. Support apparatus, as above. 2. Findings remain most concerning for severe multilobar bilateral pneumonia. 3. Small to moderate left pleural effusion increasingly apparent. Electronically Signed   By: Trudie Reed M.D.   On: 03/07/2022 05:32    PHYSICAL EXAM General: Pleasant elderly African-American male patient in no acute distress Respiratory: No distress  Neurological Exam  :  Awake alert oriented to time place and person.  Mild nonfluent speech with some word finding difficulties but can speak sentences.  Follows commands well.  Mild dysarthria PERRL, EOMI, positive oculocephalic reflex, positive cough and gag reflexes.  Antigravity strength in BUE, no spontaneous movement of BLE but withdraws to noxious stimuli in bilaterally.  ASSESSMENT/PLAN Mr. Caleb Cuevas is a 76 y.o. male with history of stroke, atrial fibrillation on anticoagulation, CHF, HLD, HTN, diabetic retinopathy, DM and OSA presenting with acute onset right sided numbness, right sided weakness, left gaze deviation and aphasia.  EMS was called, he was brought to the hospital and intubated for airway protection.  He was not a candidate for TNK due to Eliquis use, and no LVO was found on CTA.  Stroke: Small left insular embolic infarct  and subacute right corpus callosum white matter infarcts in the setting of hypoxic respiratory failure Etiology:  likely cardioembolic in setting of atrial fibrillation on anticoagulation Code Stroke CT head No acute abnormality. Small vessel disease. Atrophy. ASPECTS 10.    CTA head & neck no LVO, moderate to severe left P2 stenosis, right V4 stenosis, 47mm ACA aneurysm MRI small left subinsular cortex acute and right corpus callosum with subacute infarcts 2D Echo EF 50 to 55%.  Negative bubble study LDL 108 HgbA1c 6.5 VTE prophylaxis - fully anticoagulated with heparin    Diet   Diet NPO time specified   Eliquis (apixaban) daily prior to admission, now on heparin IV.  Therapy recommendations:  pending Disposition:  pending  Hypertension Home meds:  olmesartan 10 mg daily Stable Permissive hypertension (OK if < 220/120) but gradually normalize in 5-7 days Long-term BP goal normotensive  Hyperlipidemia Home meds:  none LDL 108, goal < 70 Add atorvastatin 40 mg daily  Continue statin at discharge  Diabetes type II Controlled Home meds:  insulin aspart 18 units with meals HgbA1c 6.5, goal < 7.0 CBGs Recent Labs    03/07/22 0332 03/07/22 0726 03/07/22 1137  GLUCAP 257* 212* 142*    SSI  Respiratory failure with community acquired pneumonia Patient was intubated for airway protection Multifocal pneumonia seen on chest xray Ventilator management per CCM Antibiotics per primary team Attempt to extubate soon  Other Stroke Risk Factors Advanced Age >/= 60  Former cigarette smoker Obesity, Body mass index is 30.83 kg/m., BMI >/= 30 associated with increased stroke risk, recommend weight loss, diet and exercise as appropriate  Hx stroke Obstructive sleep apnea Congestive heart failure  Other Active Problems none  Hospital day # 1  Patient presented with hypoxic respiratory failure likely due to pulmonary edema and cardiac issues and was found to be aphasic with  right hemiparesis and MRI scan shows small embolic left insular cortex and subacute right corpus callosum infarcts likely from his atrial fibrillation despite being on Eliquis.  Continue anticoagulation with IV heparin for his A-fib till patient is able to swallow and then resume Eliquis.  There is no clear data suggesting superiority of Pradaxa or Xarelto or Eliquis for Eliquis failure and if patient can afford the co-pay for Pradaxa may consider switching him to that.   Long discussion with the patient   at bedside and answered questions.  Discussed with Dr. Melody Comas critical care medicine.  Stroke team will sign off.  Kindly call for questions. This patient is critically ill and at significant risk of neurological worsening, death and care requires constant monitoring of vital signs, hemodynamics,respiratory and cardiac monitoring, extensive review of multiple databases, frequent neurological assessment, discussion with family, other specialists and medical decision making of high complexity.I have made any additions or clarifications directly to the above note.This critical care time does not reflect procedure time, or teaching time or supervisory time of PA/NP/Med Resident etc but could involve care discussion time.  I spent 30 minutes of neurocritical care time  in the care of  this patient.     Delia Heady, MD Medical Director [facility] Pager: [phone] 03/07/2022 2:10 PM   To contact Stroke Continuity provider, please refer to [email or web address]. After hours, contact General Neurology

## 2022-03-08 ENCOUNTER — Inpatient Hospital Stay (HOSPITAL_COMMUNITY): Payer: No Typology Code available for payment source

## 2022-03-08 DIAGNOSIS — J9602 Acute respiratory failure with hypercapnia: Secondary | ICD-10-CM

## 2022-03-08 DIAGNOSIS — I10 Essential (primary) hypertension: Secondary | ICD-10-CM

## 2022-03-08 DIAGNOSIS — J9601 Acute respiratory failure with hypoxia: Secondary | ICD-10-CM | POA: Diagnosis not present

## 2022-03-08 DIAGNOSIS — I6340 Cerebral infarction due to embolism of unspecified cerebral artery: Principal | ICD-10-CM

## 2022-03-08 LAB — CBC
HCT: 36.3 % — ABNORMAL LOW (ref 39.0–52.0)
Hemoglobin: 11.8 g/dL — ABNORMAL LOW (ref 13.0–17.0)
MCH: 27.2 pg (ref 26.0–34.0)
MCHC: 32.5 g/dL (ref 30.0–36.0)
MCV: 83.6 fL (ref 80.0–100.0)
Platelets: 152 10*3/uL (ref 150–400)
RBC: 4.34 MIL/uL (ref 4.22–5.81)
RDW: 14.6 % (ref 11.5–15.5)
WBC: 11.7 10*3/uL — ABNORMAL HIGH (ref 4.0–10.5)
nRBC: 0 % (ref 0.0–0.2)

## 2022-03-08 LAB — BASIC METABOLIC PANEL
Anion gap: 12 (ref 5–15)
BUN: 51 mg/dL — ABNORMAL HIGH (ref 8–23)
CO2: 26 mmol/L (ref 22–32)
Calcium: 8.6 mg/dL — ABNORMAL LOW (ref 8.9–10.3)
Chloride: 103 mmol/L (ref 98–111)
Creatinine, Ser: 2.89 mg/dL — ABNORMAL HIGH (ref 0.61–1.24)
GFR, Estimated: 22 mL/min — ABNORMAL LOW (ref >60)
Glucose, Bld: 109 mg/dL — ABNORMAL HIGH (ref 70–99)
Potassium: 3.9 mmol/L (ref 3.5–5.1)
Sodium: 141 mmol/L (ref 135–145)

## 2022-03-08 LAB — GLUCOSE, CAPILLARY
Glucose-Capillary: 129 mg/dL — ABNORMAL HIGH (ref 70–99)
Glucose-Capillary: 94 mg/dL (ref 70–99)
Glucose-Capillary: 143 mg/dL — ABNORMAL HIGH (ref 70–99)
Glucose-Capillary: 126 mg/dL — ABNORMAL HIGH (ref 70–99)
Glucose-Capillary: 127 mg/dL — ABNORMAL HIGH (ref 70–99)
Glucose-Capillary: 140 mg/dL — ABNORMAL HIGH (ref 70–99)

## 2022-03-08 LAB — CULTURE, RESPIRATORY W GRAM STAIN: Culture: NORMAL

## 2022-03-08 LAB — HEPARIN LEVEL (UNFRACTIONATED): Heparin Unfractionated: >1.1 IU/mL — ABNORMAL HIGH (ref 0.30–0.70)

## 2022-03-08 LAB — APTT: aPTT: 49 seconds — ABNORMAL HIGH (ref 24–36)

## 2022-03-08 MED ORDER — DILTIAZEM LOAD VIA INFUSION
10.0000 mg | Freq: Once | INTRAVENOUS | Status: AC
  Administered 2022-03-08: 10 mg via INTRAVENOUS
  Filled 2022-03-08: qty 10

## 2022-03-08 MED ORDER — METOPROLOL TARTRATE 5 MG/5ML IV SOLN
5.0000 mg | INTRAVENOUS | Status: DC | PRN
  Administered 2022-03-08: 5 mg via INTRAVENOUS
  Filled 2022-03-08: qty 5

## 2022-03-08 MED ORDER — DILTIAZEM HCL-DEXTROSE 125-5 MG/125ML-% IV SOLN (PREMIX)
5.0000 mg/h | INTRAVENOUS | Status: DC
  Administered 2022-03-08: 5 mg/h via INTRAVENOUS
  Filled 2022-03-08: qty 125

## 2022-03-08 MED ORDER — APIXABAN 5 MG PO TABS
5.0000 mg | ORAL_TABLET | Freq: Two times a day (BID) | ORAL | Status: DC
  Administered 2022-03-08 – 2022-03-09 (×2): 5 mg via ORAL
  Filled 2022-03-08 (×2): qty 1

## 2022-03-08 MED ORDER — METOPROLOL TARTRATE 50 MG PO TABS
50.0000 mg | ORAL_TABLET | Freq: Two times a day (BID) | ORAL | Status: DC
  Administered 2022-03-08 – 2022-03-09 (×2): 50 mg via ORAL
  Filled 2022-03-08 (×3): qty 1

## 2022-03-08 NOTE — Evaluation (Signed)
Physical Therapy Evaluation Patient Details Name: Caleb Cuevas MRN: [id number] DOB: 01-May-1946 Today's Date: 03/08/2022  History of Present Illness  76 yo male presents to [facility] on 7/31 with respiratory distress, RUE weakness. 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 ETT 7/31-8/1. recent admission 7/20 for HF. PMHx significant for DMII, HTN, HLD, PAD, afib, Hx of TIA and cerebral aneurysm.  Clinical Impression   Pt presents with generalized weakness, impaired LUE/LE coordination s/p CVA, impaired balance with history of falls, and decreased activity tolerance vs baseline.  Pt to benefit from acute PT to address deficits. Pt ambulated room distance with use of RW and overall min assist for steadying and correcting posterior bias. PT recommending HHPT to address deficits and decrease fall risk at home. PT to progress mobility as tolerated, and will continue to follow acutely.         Recommendations for follow up therapy are one component of a multi-disciplinary discharge planning process, led by the attending physician.  Recommendations may be updated based on patient status, additional functional criteria and insurance authorization.  Follow Up Recommendations Home health PT      Assistance Recommended at Discharge Intermittent Supervision/Assistance  Patient can return home with the following  A little help with walking and/or transfers;A little help with bathing/dressing/bathroom;Assistance with cooking/housework;Direct supervision/assist for medications management;Help with stairs or ramp for entrance;Assist for transportation    Equipment Recommendations None recommended by PT  Recommendations for Other Services       Functional Status Assessment Patient has had a recent decline in their functional  status and demonstrates the ability to make significant improvements in function in a reasonable and predictable amount of time.     Precautions / Restrictions Precautions Precautions: Fall Restrictions Weight Bearing Restrictions: No      Mobility  Bed Mobility Overal bed mobility: Needs Assistance Bed Mobility: Supine to Sit     Supine to sit: Min assist     General bed mobility comments: assist to right trunk, correct posterior bias. Increased time and effort    Transfers Overall transfer level: Needs assistance Equipment used: Rolling walker (2 wheels) Transfers: Sit to/from Stand Sit to Stand: Min assist, From elevated surface           General transfer comment: assist to steady, posterior bias noted    Ambulation/Gait Ambulation/Gait assistance: Min assist Gait Distance (Feet): 20 Feet Assistive device: Rolling walker (2 wheels) Gait Pattern/deviations: Step-through pattern, Decreased stride length, Drifts right/left Gait velocity: decr     General Gait Details: assist to steady, guide RW. Pt with periods of bilat LE bouncing, states this occurs before his legs give way and lead to fall.  Stairs            Wheelchair Mobility    Modified Rankin (Stroke Patients Only) Modified Rankin (Stroke Patients Only) Pre-Morbid Rankin Score: Moderate disability Modified Rankin: Moderately severe disability     Balance Overall balance assessment: Needs assistance Sitting-balance support: No upper extremity supported, Feet supported Sitting balance-Leahy Scale: Fair     Standing balance support: Bilateral upper extremity supported, During functional activity Standing balance-Leahy Scale: Poor Standing balance comment: reliant on external support                             Pertinent Vitals/Pain Pain  Assessment Pain Assessment: No/denies pain    Home Living Family/patient expects to be discharged to:: Private residence Living  Arrangements: Spouse/significant other Available Help at Discharge: Family Type of Home: House Home Access: Stairs to enter   [employer] of Steps: 1 step from garage, level entry through front door   Home Layout: Two level;Able to live on main level with bedroom/bathroom Home Equipment: Rollator (4 wheels);Cane - single point;Wheelchair - manual;Grab bars - toilet;Grab bars - tub/shower;Shower seat - built in      Prior Function Prior Level of Function : Needs assist;History of Falls (last six months)             Mobility Comments: pt states he uses cane as needed in the home, uses w/c for to/from appointments as legs "give way" and lead to falls ADLs Comments: has assist as needed from wife and friend who is a Nature conservation officer Dominance   Dominant Hand: Right    Extremity/Trunk Assessment   Upper Extremity Assessment Upper Extremity Assessment: Defer to OT evaluation    Lower Extremity Assessment Lower Extremity Assessment: Generalized weakness;LLE deficits/detail LLE Coordination: decreased gross motor (incoordinated noted during heel-to-shin, dysmetria LUE finger to nose)    Cervical / Trunk Assessment Cervical / Trunk Assessment: Normal  Communication   Communication: Other (comment) (slight slurring)  Cognition Arousal/Alertness: Awake/alert Behavior During Therapy: WFL for tasks assessed/performed Overall Cognitive Status: Within Functional Limits for tasks assessed                                          General Comments General comments (skin integrity, edema, etc.): SpO2 87-91% on RA during mobility, placed back on 2LO2 at end of session.    Exercises     Assessment/Plan    PT Assessment Patient needs continued PT services  PT Problem List Decreased strength;Decreased mobility;Decreased activity tolerance;Decreased balance;Decreased knowledge of use of DME;Cardiopulmonary status limiting activity;Decreased coordination        PT Treatment Interventions DME instruction;Therapeutic activities;Gait training;Therapeutic exercise;Patient/family education;Balance training;Functional mobility training;Neuromuscular re-education;Stair training    PT Goals (Current goals can be found in the Care Plan section)  Acute Rehab PT Goals Patient Stated Goal: home PT Goal Formulation: With patient Time For Goal Achievement: 03/22/22 Potential to Achieve Goals: Good    Frequency Min 4X/week     Co-evaluation               AM-PAC PT "6 Clicks" Mobility  Outcome Measure Help needed turning from your back to your side while in a flat bed without using bedrails?: A Little Help needed moving from lying on your back to sitting on the side of a flat bed without using bedrails?: A Little Help needed moving to and from a bed to a chair (including a wheelchair)?: A Little Help needed standing up from a chair using your arms (e.g., wheelchair or bedside chair)?: A Little Help needed to walk in hospital room?: A Little Help needed climbing 3-5 steps with a railing? : A Lot 6 Click Score: 17    End of Session Equipment Utilized During Treatment: Gait belt Activity Tolerance: Patient tolerated treatment well Patient left: in chair;with call bell/phone within reach;with chair alarm set Nurse Communication: Mobility status PT Visit Diagnosis: Other abnormalities of gait and mobility (R26.89);Muscle weakness (generalized) (M62.81)    Time: 5102-5852 PT Time Calculation (min) (ACUTE ONLY): 31  min   Charges:   PT Evaluation $PT Eval Low Complexity: 1 Low PT Treatments $Therapeutic Activity: 8-22 mins       Marye Round, PT DPT Acute Rehabilitation Services Pager [phone]  Office [phone]   Tyrone Apple E Christain [place] 03/08/2022, 9:55 AM

## 2022-03-08 NOTE — Progress Notes (Signed)
PROGRESS NOTE        PATIENT DETAILS Name: Caleb Cuevas Age: 76 y.o. Sex: male Date of Birth: 1946/05/28 Admit Date: 03/06/2022 Admitting Physician Briant Sites, DO PCP:Pcp, No  Brief Summary: Patient is a 76 y.o.  male with past medical history of chronic combined systolic/diastolic heart failure, A-fib on Eliquis, HTN, OSA, CKD stage IIIb-admitted with acute hypoxic/hypercarbic respiratory failure in the setting of hypertensive emergency causing acute pulmonary edema-patient was intubated-and subsequently admitted to the intensive care unit.  MRI brain demonstrated acute infarcts-he was stabilized-extubated and transferred to [facility] service.  See below for further details.   Significant events: 7/31>> hypoxic/hypercarbic respiratory failure-hypertensive emergency in the setting of pulmonary edema-intubated-admit to ICU. 8/1>> extubated.  MRI positive for acute infarct. 8/2>> transfer to [facility]  Significant studies: 7/31>> CT head: No acute intracranial abnormality. 7/31>> CT angio head/neck: Negative for large vessel occlusion/emergent findings.  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 7/31>> Echo: EF 50-55%, grade 1 diastolic dysfunction.  7/31>> A1c: 6.5 7/31>> EEG: No seizures. 8/01>> MRI brain: Acute infarct left insula, corpus callosum body. 8/01>> LDL: 115  Significant microbiology data: 7/31>> respiratory virus panel: Negative 7/31>> blood culture: No growth 7/31>> sputum cultures: Pending  Procedures: 7/31-8/1>> ETT  Consults: Neurology, PCCM  Subjective: Lying comfortably in bed-denies any chest pain or shortness of breath.  Objective: Vitals: Blood pressure (!) 174/72, pulse 83, temperature 98.3 F (36.8 C), temperature source Oral, resp. rate 16, height 6' (1.829 m), weight 93.7 kg, SpO2 99 %.   Exam: Gen Exam:Alert awake-not in any distress HEENT:atraumatic, normocephalic Chest: B/L clear to auscultation  anteriorly CVS:S1S2 regular Abdomen:soft non tender, non distended Extremities:no edema Neurology: Slight right-sided weakness. Skin: no rash  Pertinent Labs/Radiology:    Latest Ref Rng & Units 03/08/2022    4:42 AM 03/07/2022    7:14 AM 03/06/2022   10:04 AM  CBC  WBC 4.0 - 10.5 K/uL 11.7  13.2    Hemoglobin 13.0 - 17.0 g/dL 09.2  33.0  07.6   Hematocrit 39.0 - 52.0 % 36.3  34.5  36.0   Platelets 150 - 400 K/uL 152  142      Lab Results  Component Value Date   NA 141 03/08/2022   K 3.9 03/08/2022   CL 103 03/08/2022   CO2 26 03/08/2022      Assessment/Plan: Acute hypoxic and hypercarbic respiratory failure in the setting of acute pulm edema due to hypertensive emergency: Extubated-on 8/1-currently stable on just 2-3 L of oxygen.  Continue attempts to wean down FiO2 as tolerated.  Acute metabolic encephalopathy: Due to hypoxia-uncontrolled hypertension-improved-suspect back to baseline-currently relatively awake and alert  Hypertensive emergency: Presented with SBP in the 250s-when started on propofol for sedation-blood pressure dropped-he was managed with supportive care and as needed antihypertensives in the ICU.  See below.  Acute embolic CVA: Appreciate stroke MD input-work-up as above-Per stroke MD note-okay to switch back to Eliquis.  Currently on IV heparin.  Acute on chronic CKD stage IIIb: AKI likely hemodynamically mediated-avoid nephrotoxic agents-watch closely.  Acute on chronic systolic/diastolic heart failure: Diuresed in the ICU-stable/compensated currently.  PAF: Resuming beta-blocker-on IV heparin-should be able to switch back to Eliquis.  PNA: Doubt PNA-suspect chest x-ray findings were consistent with pulmonary edema-stop antibiotics and monitor.  Sepsis physiology ruled out.  HTN: Presented with hypertensive emergency-much better-blood pressure creeping  up-not yet on oral antihypertensives-resume metoprolol.  Hold olmesartan-given AKI.  Follow and  adjust.  DM-2: CBG stable-watch closely-continue SSI and Levemir 12 units twice daily.  Recent Labs    03/07/22 2257 03/08/22 0349 03/08/22 0749  GLUCAP 96 129* 94    4 mm anterior communicating aneurysm: Per CT report-unchanged from prior-stable for outpatient follow-up with his neurologist.  Left foot wound: Continue wound care.   Nutrition Status: Nutrition Problem: Inadequate oral intake Etiology: inability to eat Signs/Symptoms: NPO status Interventions: Tube feeding  BMI: Estimated body mass index is 28.02 kg/m as calculated from the following:   Height as of this encounter: 6' (1.829 m).   Weight as of this encounter: 93.7 kg.   Code status:   Code Status: Full Code   DVT Prophylaxis: SCDs Start: 03/06/22 0538 IV heparin.   Family Communication: None at bedside   Disposition Plan: Status is: Inpatient Remains inpatient appropriate because: Resolving hypoxia-encephalopathy-acute CVA-debilitated-needs several more days for optimization of BP control-needs PT/OT eval to determine safe disposition.   Planned Discharge Destination:Skilled nursing facility   Diet: Diet Order             Diet heart healthy/carb modified Room service appropriate? Yes; Fluid consistency: Thin  Diet effective now                     Antimicrobial agents: Anti-infectives (From admission, onward)    Start     Dose/Rate Route Frequency Ordered Stop   03/06/22 0345  cefTRIAXone (ROCEPHIN) 2 g in sodium chloride 0.9 % 100 mL IVPB        2 g 200 mL/hr over 30 Minutes Intravenous Every 24 hours 03/06/22 0342 03/11/22 0344   03/06/22 0345  azithromycin (ZITHROMAX) 500 mg in sodium chloride 0.9 % 250 mL IVPB        500 mg 250 mL/hr over 60 Minutes Intravenous Every 24 hours 03/06/22 0342 03/11/22 0344        MEDICATIONS: Scheduled Meds:  arformoterol  15 mcg Nebulization BID   atorvastatin  40 mg Oral Daily   Chlorhexidine Gluconate Cloth  6 each Topical Daily    DULoxetine  60 mg Oral BID   feeding supplement (PROSource TF)  45 mL Per Tube TID   insulin aspart  0-15 Units Subcutaneous Q4H   insulin detemir  12 Units Subcutaneous BID   pantoprazole  40 mg Oral Daily   revefenacin  175 mcg Nebulization Daily   Continuous Infusions:  sodium chloride     azithromycin 500 mg (03/08/22 0501)   cefTRIAXone (ROCEPHIN)  IV 2 g (03/08/22 0353)   feeding supplement (VITAL 1.5 CAL) 60 mL/hr at 03/07/22 1800   heparin 1,400 Units/hr (03/07/22 2028)   PRN Meds:.docusate sodium, hydrALAZINE, ipratropium-albuterol, polyethylene glycol   I have personally reviewed following labs and imaging studies  LABORATORY DATA: CBC: Recent Labs  Lab 03/06/22 0224 03/06/22 0231 03/06/22 0319 03/06/22 1004 03/07/22 0714 03/08/22 0442  WBC 23.3*  --   --   --  13.2* 11.7*  NEUTROABS 14.7*  --   --   --   --   --   HGB 14.0 15.6 13.3 12.2* 11.4* 11.8*  HCT 45.0 46.0 39.0 36.0* 34.5* 36.3*  MCV 88.1  --   --   --  82.7 83.6  PLT 224  --   --   --  142* 0000000    Basic Metabolic Panel: Recent Labs  Lab 03/06/22 0224 03/06/22 0231 03/06/22 0319  03/06/22 1004 03/07/22 0714 03/08/22 0442  NA 139 141 140 138 140 141  K 4.1 4.0 4.1 4.5 4.4 3.9  CL 107 105  --   --  109 103  CO2 23  --   --   --  20* 26  GLUCOSE 185* 185*  --   --  252* 109*  BUN 26* 33*  --   --  53* 51*  CREATININE 2.52* 2.40*  --   --  2.84* 2.89*  CALCIUM 8.2*  --   --   --  8.3* 8.6*  MG  --   --   --   --  2.1  --   PHOS  --   --   --   --  3.9  --     GFR: Estimated Creatinine Clearance: 26.2 mL/min (A) (by C-G formula based on SCr of 2.89 mg/dL (H)).  Liver Function Tests: Recent Labs  Lab 03/06/22 0224  AST 26  ALT 23  ALKPHOS 77  BILITOT 0.9  PROT 7.1  ALBUMIN 3.0*   No results for input(s): "LIPASE", "AMYLASE" in the last 168 hours. No results for input(s): "AMMONIA" in the last 168 hours.  Coagulation Profile: Recent Labs  Lab 03/06/22 0224  INR 1.4*     Cardiac Enzymes: No results for input(s): "CKTOTAL", "CKMB", "CKMBINDEX", "TROPONINI" in the last 168 hours.  BNP (last 3 results) No results for input(s): "PROBNP" in the last 8760 hours.  Lipid Profile: Recent Labs    03/07/22 0714  CHOL 169  HDL 34*  LDLCALC 115*  TRIG 101  100  CHOLHDL 5.0    Thyroid Function Tests: No results for input(s): "TSH", "T4TOTAL", "FREET4", "T3FREE", "THYROIDAB" in the last 72 hours.  Anemia Panel: No results for input(s): "VITAMINB12", "FOLATE", "FERRITIN", "TIBC", "IRON", "RETICCTPCT" in the last 72 hours.  Urine analysis:    Component Value Date/Time   COLORURINE YELLOW 03/06/2022 0316   APPEARANCEUR CLEAR 03/06/2022 0316   LABSPEC 1.015 03/06/2022 0316   PHURINE 5.0 03/06/2022 0316   GLUCOSEU NEGATIVE 03/06/2022 0316   HGBUR NEGATIVE 03/06/2022 0316   BILIRUBINUR NEGATIVE 03/06/2022 0316   KETONESUR NEGATIVE 03/06/2022 0316   PROTEINUR 100 (A) 03/06/2022 0316   NITRITE NEGATIVE 03/06/2022 0316   LEUKOCYTESUR NEGATIVE 03/06/2022 0316    Sepsis Labs: Lactic Acid, Venous    Component Value Date/Time   LATICACIDVEN 2.4 (HH) 03/06/2022 1156    MICROBIOLOGY: Recent Results (from the past 240 hour(s))  Blood culture (routine x 2)     Status: None (Preliminary result)   Collection Time: 03/06/22  3:30 AM   Specimen: BLOOD  Result Value Ref Range Status   Specimen Description BLOOD RIGHT ANTECUBITAL  Final   Special Requests   Final    BOTTLES DRAWN AEROBIC AND ANAEROBIC Blood Culture adequate volume   Culture   Final    NO GROWTH 1 DAY Performed at [facility], [place] [address], [place], [place] [address]    Report Status PENDING  Incomplete  Blood culture (routine x 2)     Status: None (Preliminary result)   Collection Time: 03/06/22  3:40 AM   Specimen: BLOOD RIGHT HAND  Result Value Ref Range Status   Specimen Description BLOOD RIGHT HAND  Final   Special Requests   Final    BOTTLES DRAWN AEROBIC AND  ANAEROBIC Blood Culture adequate volume   Culture   Final    NO GROWTH 1 DAY Performed at [facility],  [address] [place], [place] [address]    Report Status PENDING  Incomplete  Respiratory (~20 pathogens) panel by PCR     Status: None   Collection Time: 03/06/22  5:40 AM   Specimen: Nasopharyngeal Swab; Respiratory  Result Value Ref Range Status   Adenovirus NOT DETECTED NOT DETECTED Final   Coronavirus 229E NOT DETECTED NOT DETECTED Final    Comment: (NOTE) The Coronavirus on the Respiratory Panel, DOES NOT test for the novel  Coronavirus (2019 nCoV)    Coronavirus HKU1 NOT DETECTED NOT DETECTED Final   Coronavirus NL63 NOT DETECTED NOT DETECTED Final   Coronavirus OC43 NOT DETECTED NOT DETECTED Final   Metapneumovirus NOT DETECTED NOT DETECTED Final   Rhinovirus / Enterovirus NOT DETECTED NOT DETECTED Final   Influenza A NOT DETECTED NOT DETECTED Final   Influenza B NOT DETECTED NOT DETECTED Final   Parainfluenza Virus 1 NOT DETECTED NOT DETECTED Final   Parainfluenza Virus 2 NOT DETECTED NOT DETECTED Final   Parainfluenza Virus 3 NOT DETECTED NOT DETECTED Final   Parainfluenza Virus 4 NOT DETECTED NOT DETECTED Final   Respiratory Syncytial Virus NOT DETECTED NOT DETECTED Final   Bordetella pertussis NOT DETECTED NOT DETECTED Final   Bordetella Parapertussis NOT DETECTED NOT DETECTED Final   Chlamydophila pneumoniae NOT DETECTED NOT DETECTED Final   Mycoplasma pneumoniae NOT DETECTED NOT DETECTED Final    Comment: Performed at [facility], [address] [place], [place] [address]  MRSA Next Gen by PCR, Nasal     Status: None   Collection Time: 03/06/22  6:54 AM  Result Value Ref Range Status   MRSA by PCR Next Gen NOT DETECTED NOT DETECTED Final    Comment: (NOTE) The GeneXpert MRSA Assay (FDA approved for NASAL specimens only), is one component of a comprehensive MRSA colonization surveillance program. It is not intended to diagnose MRSA infection nor  to guide or monitor treatment for MRSA infections. Test performance is not FDA approved in patients less than 63 years old. Performed at [facility], [address] [place], [place] [address]   Culture, Respiratory w Gram Stain     Status: None (Preliminary result)   Collection Time: 03/06/22  7:53 AM   Specimen: Tracheal Aspirate; Respiratory  Result Value Ref Range Status   Specimen Description TRACHEAL ASPIRATE  Final   Special Requests NONE  Final   Gram Stain   Final    MODERATE WBC PRESENT,BOTH PMN AND MONONUCLEAR MODERATE ABUNDANT GRAM POSITIVE COCCI ABUNDANT GRAM POSITIVE RODS ABUNDANT GRAM NEGATIVE RODS    Culture   Final    CULTURE REINCUBATED FOR BETTER GROWTH Performed at [facility], [address] [place], [place] [address]    Report Status PENDING  Incomplete    RADIOLOGY STUDIES/RESULTS: DG Chest Port 1 View  Result Date: 03/08/2022 CLINICAL DATA:  Recent extubation. EXAM: PORTABLE CHEST 1 VIEW COMPARISON:  Chest radiograph March 07, 2022. FINDINGS: EKG leads project over the chest. Interval extubation and removal of the nasogastric tube. The heart size and mediastinal contours are unchanged. Improved aeration of the lungs with decreased interstitial opacities. Persistent obscuration of the left hemidiaphragm. Stable layering small left pleural effusion. The visualized skeletal structures are unchanged. IMPRESSION: 1. Interval extubation and removal of the nasogastric tube. 2. Improved aeration of the lungs with decreased interstitial and patchy opacities. 3. Stable layering small left pleural effusion. Electronically Signed   By: Maudry Mayhew M.D.   On: 03/08/2022 08:33  DG Abd Portable 1V  Result Date: 03/07/2022 CLINICAL DATA:  Possible metal prior to MRI. EXAM: PORTABLE ABDOMEN - 1 VIEW COMPARISON:  None Available. FINDINGS: The bowel gas pattern is normal. Nasogastric tube tip is seen in expected position of distal stomach. Probable small left  renal calculus is noted. No other definite radiopaque foreign body is noted. IMPRESSION: Probable small left renal calculus. Nasogastric tube tip seen in expected position of distal stomach. Electronically Signed   By: Marijo Conception M.D.   On: 03/07/2022 08:16   MR BRAIN WO CONTRAST  Result Date: 03/07/2022 CLINICAL DATA:  Acute onset shortness of breath. EXAM: MRI HEAD WITHOUT CONTRAST TECHNIQUE: Multiplanar, multiecho pulse sequences of the brain and surrounding structures were obtained without intravenous contrast. COMPARISON:  CT and CTA from yesterday FINDINGS: Brain: Small acute infarct below the left insula. Weakly restricted diffusion at the right corpus callosum body. Ischemic gliosis in the cerebral white matter and to a limited degree in the brainstem. No hemorrhage, hydrocephalus, or collection. Calcification or chronic blood products in the left occipital white matter, nonspecific in isolation. No acute hemorrhage, hydrocephalus, or masslike finding. Vascular: Major flow voids are preserved Skull and upper cervical spine: Normal marrow signal Sinuses/Orbits: Negative.  Bilateral cataract resection. IMPRESSION: 1. Small acute infarct below the left insula. Small subacute infarct at the corpus callosum body. 2. Mild chronic small vessel ischemia in the cerebral white matter. Electronically Signed   By: Jorje Guild M.D.   On: 03/07/2022 07:26   DG Chest Port 1 View  Result Date: 03/07/2022 CLINICAL DATA:  76 year old male with history of acute hypoxic respiratory failure. EXAM: PORTABLE CHEST 1 VIEW COMPARISON:  Chest x-ray 03/06/2022. FINDINGS: An endotracheal tube is in place with tip 4.2 cm above the carina. A nasogastric tube is seen extending into the stomach, however, the tip of the nasogastric tube extends below the lower margin of the image. Lung volumes are slightly low. Widespread areas of interstitial prominence and ill-defined opacities are evident throughout the mid to lower lungs  bilaterally, particularly in the lung bases where there is extensive atelectasis and/or consolidation. Complete obscuration of the left hemidiaphragm and blunting the left costophrenic sulcus indicative of a small to moderate left pleural effusion. No definite right pleural effusion. Pulmonary vasculature is largely obscured but does not appear engorged. Heart size is normal. Upper mediastinal contours are within normal limits. IMPRESSION: 1. Support apparatus, as above. 2. Findings remain most concerning for severe multilobar bilateral pneumonia. 3. Small to moderate left pleural effusion increasingly apparent. Electronically Signed   By: Vinnie Langton M.D.   On: 03/07/2022 05:32   ECHOCARDIOGRAM COMPLETE BUBBLE STUDY  Result Date: 03/06/2022    ECHOCARDIOGRAM REPORT   Patient Name:   KENNIS NAEGELE Date of Exam: 03/06/2022 Medical Rec #:  [id number]    Height:       72.0 in Accession #:    [id number]   Weight:       233.5 lb Date of Birth:  06/03/1946    BSA:          2.275 m Patient Age:    24 years     BP:           146/68 mmHg Patient Gender: M            HR:           72 bpm. Exam Location:  Inpatient Procedure: 2D Echo, Cardiac Doppler, Color Doppler and Saline Contrast Bubble  Study Indications:    R55 Syncope  History:        Patient has prior history of Echocardiogram examinations, most                 recent 02/14/2021. CHF, Arrythmias:Atrial Flutter and Atrial                 Fibrillation; Risk Factors:Diabetes, Sleep Apnea and                 Hypertension.  Sonographer:    Bernadene Person RDCS Referring Phys: [id number] [place]  Sonographer Comments: Echo performed with patient supine and on artificial respirator. IMPRESSIONS  1. Study limited due to poor echo windows.  2. Left ventricular ejection fraction, by estimation, is 50 to 55%. The left ventricle has low normal function. Left ventricular endocardial border not optimally defined to evaluate regional wall motion. Left ventricular  diastolic parameters are consistent with Grade I diastolic dysfunction (impaired relaxation).  3. Right ventricular systolic function is normal. The right ventricular size is normal. Tricuspid regurgitation signal is inadequate for assessing PA pressure.  4. The mitral valve is grossly normal. Mild mitral valve regurgitation.  5. The aortic valve was not well visualized. Aortic valve regurgitation is not visualized. No aortic stenosis is present.  6. Aortic dilatation noted. There is borderline dilatation of the ascending aorta, measuring 38 mm.  7. Agitated saline contrast bubble study was negative, with no evidence of any interatrial shunt. Comparison(s): Compared to prior study on 02/2021, the current study is limited due to poor echo windows. Grossly, the LVEF appears slightly improved although wall motion cannot be assess on current study (prior EF 45-50%). The diastolic function has improved from grade 3 diastolic dysfunction on prior study to grade 1. FINDINGS  Left Ventricle: Left ventricular ejection fraction, by estimation, is 50 to 55%. The left ventricle has low normal function. Left ventricular endocardial border not optimally defined to evaluate regional wall motion. The left ventricular internal cavity  size was normal in size. There is no left ventricular hypertrophy. Left ventricular diastolic parameters are consistent with Grade I diastolic dysfunction (impaired relaxation). Right Ventricle: The right ventricular size is normal. No increase in right ventricular wall thickness. Right ventricular systolic function is normal. Tricuspid regurgitation signal is inadequate for assessing PA pressure. Left Atrium: Left atrial size was not well visualized. Right Atrium: Right atrial size was not well visualized. Pericardium: There is no evidence of pericardial effusion. Mitral Valve: The mitral valve is grossly normal. Mild mitral valve regurgitation. Tricuspid Valve: The tricuspid valve is normal in  structure. Tricuspid valve regurgitation is trivial. Aortic Valve: The aortic valve was not well visualized. Aortic valve regurgitation is not visualized. No aortic stenosis is present. Pulmonic Valve: The pulmonic valve was not well visualized. Aorta: The aortic root is normal in size and structure and aortic dilatation noted. There is borderline dilatation of the ascending aorta, measuring 38 mm. Venous: IVC assessment for right atrial pressure unable to be performed due to mechanical ventilation. IAS/Shunts: The atrial septum is grossly normal. Agitated saline contrast was given intravenously to evaluate for intracardiac shunting. Agitated saline contrast bubble study was negative, with no evidence of any interatrial shunt.  LEFT VENTRICLE PLAX 2D LVIDd:         5.00 cm   Diastology LVIDs:         3.90 cm   LV e' medial:    4.56 cm/s LV PW:  1.00 cm   LV E/e' medial:  15.6 LV IVS:        0.80 cm   LV e' lateral:   8.27 cm/s LVOT diam:     2.10 cm   LV E/e' lateral: 8.6 LV SV:         53 LV SV Index:   23 LVOT Area:     3.46 cm  RIGHT VENTRICLE RV S prime:     12.20 cm/s TAPSE (M-mode): 1.8 cm LEFT ATRIUM             Index        RIGHT ATRIUM           Index LA diam:        3.40 cm 1.49 cm/m   RA Area:     14.10 cm LA Vol (A2C):   36.2 ml 15.91 ml/m  RA Volume:   32.30 ml  14.20 ml/m LA Vol (A4C):   33.7 ml 14.81 ml/m LA Biplane Vol: 35.9 ml 15.78 ml/m  AORTIC VALVE LVOT Vmax:   77.60 cm/s LVOT Vmean:  48.900 cm/s LVOT VTI:    0.154 m  AORTA Ao Root diam: 3.50 cm Ao Asc diam:  3.80 cm MITRAL VALVE MV Area (PHT): 3.72 cm    SHUNTS MV Decel Time: 204 msec    Systemic VTI:  0.15 m MV E velocity: 71.00 cm/s  Systemic Diam: 2.10 cm MV A velocity: 82.50 cm/s MV E/A ratio:  0.86 Gwyndolyn Kaufman MD Electronically signed by Gwyndolyn Kaufman MD Signature Date/Time: 03/06/2022/4:19:26 PM    Final    EEG adult  Result Date: 03/06/2022 Lora Havens, MD     03/06/2022  3:46 PM Patient Name: Anand Charity  MRN: [id number] Epilepsy Attending: Lora Havens Referring Physician/Provider: Garvin Fila, MD Date: 03/06/2022 Duration: 22.32 mins Patient history: 76 year old male with atrial fibrillation on Eliquis presenting via EMS with acute hypoxic respiratory failure and right hemiplegia with leftward gaze deviation and expressive aphasia.  EEG to evaluate for seizure. Level of alertness: Awake, asleep AEDs during EEG study: None Technical aspects: This EEG study was done with scalp electrodes positioned according to the 10-20 International system of electrode placement. Electrical activity was reviewed with band pass filter of 1-70Hz , sensitivity of 7 uV/mm, display speed of 60mm/sec with a 60Hz  notched filter applied as appropriate. EEG data were recorded continuously and digitally stored.  Video monitoring was available and reviewed as appropriate. Description: The posterior dominant rhythm consists of 8Hz  activity of moderate voltage (25-35 uV) seen predominantly in posterior head regions, symmetric and reactive to eye opening and eye closing. Sleep was characterized by vertex waves, sleep spindles (12 to 14 Hz), maximal frontocentral region. Hyperventilation and photic stimulation were not performed.   IMPRESSION: This study is within normal limits. No seizures or epileptiform discharges were seen throughout the recording. Priyanka Barbra Sarks     LOS: 2 days   Oren Binet, MD  [facility]    To contact the attending provider between 7A-7P or the covering provider during after hours 7P-7A, please log into the web site [email or web address] and access using universal  password for that web site. If you do not have the password, please call the hospital operator.  03/08/2022, 9:30 AM

## 2022-03-08 NOTE — TOC Initial Note (Addendum)
Transition of Care [facility]) - Initial/Assessment Note    Patient Details  Name: Caleb Cuevas MRN: [id number] Date of Birth: June 07, 1946  Transition of Care [facility]) CM/SW Contact:    Harriet Masson, RN Phone Number: 03/08/2022, 12:34 PM  Clinical Narrative:                 Sherron Monday to [place], friend, regarding transition plan. [place] states patient goes to [facility] [place]. This RNCM left VM with CSW at [facility] with the pilot mtn department  Awaiting return call.  1330 Spoke to patient that states he gets all prescriptions from the [place]. This RNCM will ask CSW from [place] if Pradaxa is a covered medication.  1445 Therapy recommends home health. Patient refers to [facility] to find highly rated agency. Spoke to [employer] with [facility] and referral accepted for Pt, ot, rn Candise Bowens will reach out to [place] for approval.   1500 DME request for 3&1 faxed to [facility]  Expected Discharge Plan: Home w Home Health Services Barriers to Discharge: Continued Medical Work up   Patient Goals and CMS Choice Patient states their goals for this hospitalization and ongoing recovery are:: To return home with wife      Expected Discharge Plan and Services Expected Discharge Plan: Home w Home Health Services   Discharge Planning Services: CM Consult   Living arrangements for the past 2 months: Single Family Home                                      Prior Living Arrangements/Services Living arrangements for the past 2 months: Single Family Home Lives with:: Spouse Patient language and need for interpreter reviewed:: Yes        Need for Family Participation in Patient Care: Yes (Comment) Care giver support system in place?: Yes (comment)   Criminal Activity/Legal Involvement Pertinent to Current Situation/Hospitalization: No - Comment as needed  Activities of Daily Living   ADL Screening (condition at time of admission) Is the patient deaf or have difficulty hearing?: No Does the patient have difficulty seeing, even when  wearing glasses/contacts?: No Does the patient have difficulty concentrating, remembering, or making decisions?: Yes Does the patient have difficulty dressing or bathing?: Yes Does the patient have difficulty walking or climbing stairs?: Yes  Permission Sought/Granted Permission sought to share information with : Case Manager, Family Supports                Emotional Assessment Appearance:: Appears stated age Attitude/Demeanor/Rapport: Unable to Assess Affect (typically observed): Unable to Assess Orientation: : Oriented to Self Alcohol / Substance Use: Not Applicable Psych Involvement: No (comment)  Admission diagnosis:  Hypertensive emergency [I16.1] Right sided weakness [R53.1] Acute respiratory failure with hypoxia and hypercapnia (HCC) [J96.01, J96.02] Patient Active Problem List   Diagnosis Date Noted   Hypertensive emergency 03/06/2022   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) 07/21/2021   Coronary artery disease involving native coronary artery of native heart without angina pectoris 07/21/2021   Diastolic CHF, acute (HCC) 02/14/2021   A-fib (HCC) 02/14/2021   Atrial flutter (HCC) 02/14/2021   Hypoxia 02/14/2021   OSA (obstructive sleep apnea) 02/14/2021   Acute respiratory failure with hypoxia (HCC) 02/14/2021   Respiratory arrest (HCC)    Acute pulmonary edema (HCC) 02/13/2021   Narrow complex tachycardia (HCC) 02/13/2021   CKD (chronic kidney disease) stage 3, GFR 30-59 ml/min (HCC) 02/13/2021   New  onset of congestive heart failure (HCC) 02/13/2021   Peripheral arterial disease (HCC) 01/02/2020   TIA (transient ischemic attack) 06/18/2019   Polycythemia, 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   Leukocytosis 08/24/2018   Nonproliferative diabetic retinopathy (HCC)    Unstable angina (HCC) 12/25/2017   Chest pain 12/24/2017   Diabetes mellitus type 2 in nonobese (HCC) 12/24/2017   Essential hypertension 12/24/2017   HLD (hyperlipidemia) 12/24/2017    PCP:  Oneita Hurt, No Pharmacy:   [id number] - [place] - [address] [place] [address] Phone: [phone] Fax: [phone]  [facility] - [place], [place] - [address] [facility] [address] [place] [address] Phone: [phone] Fax: [phone]  [facility] - [place], [place] - [address] [place] [address] Phone: [phone] Fax: [phone]     Social Determinants of Health (SDOH) Interventions    Readmission Risk Interventions     No data to display

## 2022-03-08 NOTE — Progress Notes (Signed)
ANTICOAGULATION CONSULT NOTE  Pharmacy Consult for Heparin >>apixaban Indication: atrial fibrillation  No Known Allergies  Patient Measurements: Height: 6' (182.9 cm) Weight: 93.7 kg (206 lb 9.1 oz) IBW/kg (Calculated) : 77.6 Heparin Dosing Weight: 100 kg  Vital Signs: Temp: 98 F (36.7 C) (08/02 1148) Temp Source: Oral (08/02 1148) BP: 129/104 (08/02 1148) Pulse Rate: 150 (08/02 1148)  Labs: Recent Labs    03/06/22 0224 03/06/22 0231 03/06/22 0319 03/06/22 0537 03/06/22 0810 03/06/22 1004 03/06/22 1156 03/06/22 1742 03/07/22 0714 03/08/22 0442  HGB 14.0 15.6   < >  --   --  12.2*  --   --  11.4* 11.8*  HCT 45.0 46.0   < >  --   --  36.0*  --   --  34.5* 36.3*  PLT 224  --   --   --   --   --   --   --  142* 152  APTT 31  --   --   --   --   --   --  83* 75* 49*  LABPROT 16.6*  --   --   --   --   --   --   --   --   --   INR 1.4*  --   --   --   --   --   --   --   --   --   HEPARINUNFRC  --   --   --   --   --   --   --  >1.10* >1.10* >1.10*  CREATININE 2.52* 2.40*  --   --   --   --   --   --  2.84* 2.89*  TROPONINIHS 29*  --   --  31* 45*  --  55*  --   --   --    < > = values in this interval not displayed.     Estimated Creatinine Clearance: 26.2 mL/min (A) (by C-G formula based on SCr of 2.89 mg/dL (H)).  Assessment: 76 y.o. M presents with hypoxemia and hypertensive emergency. Pt on apixaban PTA for afib/h/o CVA. Unsure timing of last dose but at least 12 hours ago.  Eliquis affecting heparin level; therefore, will utilize aPTT monitoring until levels are correlating.  aPTT SUBtherapeutic this morning at 49.  No bleeding or infusion issues per RN. Hgb stable today.  MD wishes to change to apixaban for afib.   Goal of Therapy:  Monitor platelets by anticoagulation protocol: Yes   Plan:  Apixaban 5mg  BID starting at 2200 Stop heparin infusion at 2200 once dose of apixaban given    Matas Burrows A. , PharmD, BCPS, FNKF Clinical Pharmacist [facility] Please utilize Amion for appropriate phone number to reach the unit pharmacist [facility])

## 2022-03-08 NOTE — Progress Notes (Signed)
ANTICOAGULATION CONSULT NOTE  Pharmacy Consult for Heparin Indication: atrial fibrillation  No Known Allergies  Patient Measurements: Height: 6' (182.9 cm) Weight: 93.7 kg (206 lb 9.1 oz) IBW/kg (Calculated) : 77.6 Heparin Dosing Weight: 100 kg  Vital Signs: Temp: 98 F (36.7 C) (08/02 1148) Temp Source: Oral (08/02 1148) BP: 129/104 (08/02 1148) Pulse Rate: 150 (08/02 1148)  Labs: Recent Labs    03/06/22 0224 03/06/22 0231 03/06/22 0319 03/06/22 0537 03/06/22 0810 03/06/22 1004 03/06/22 1156 03/06/22 1742 03/07/22 0714 03/08/22 0442  HGB 14.0 15.6   < >  --   --  12.2*  --   --  11.4* 11.8*  HCT 45.0 46.0   < >  --   --  36.0*  --   --  34.5* 36.3*  PLT 224  --   --   --   --   --   --   --  142* 152  APTT 31  --   --   --   --   --   --  83* 75* 49*  LABPROT 16.6*  --   --   --   --   --   --   --   --   --   INR 1.4*  --   --   --   --   --   --   --   --   --   HEPARINUNFRC  --   --   --   --   --   --   --  >1.10* >1.10* >1.10*  CREATININE 2.52* 2.40*  --   --   --   --   --   --  2.84* 2.89*  TROPONINIHS 29*  --   --  31* 45*  --  55*  --   --   --    < > = values in this interval not displayed.     Estimated Creatinine Clearance: 26.2 mL/min (A) (by C-G formula based on SCr of 2.89 mg/dL (H)).  Assessment: 76 y.o. M presents with hypoxemia and hypertensive emergency. Pt on apixaban PTA for afib/h/o CVA. Unsure timing of last dose but at least 12 hours ago.  Eliquis affecting heparin level; therefore, will utilize aPTT monitoring until levels are correlating.  aPTT SUBtherapeutic this morning at 49.  No bleeding or infusion issues per RN. Hgb stable today.  Goal of Therapy:  Heparin level 0.3-0.7 units/ml, aPTT 66-102 sec Monitor platelets by anticoagulation protocol: Yes   Plan:  Increase heparin infusion to 1700 units/hr F/u 8 hour aPTT Daily aPTT, heparin level, and CBC   Thank you for allowing [place] to participate in this patients care. Signe Colt, PharmD 03/08/2022 12:55 PM  **Pharmacist phone directory can be found on [email or web address] listed under [facility]**

## 2022-03-08 NOTE — Consult Note (Addendum)
WOC Nurse Consult Note: Reason for Consult: Consult requested for left foot and toe.  Pt states he has been followed in the past by the [facility] outpatient wound care center, but his previous wound healed and he no longer needs to be seen.  He is followed as an outpatient by a podiatrist who trims several callous areas periodically. Left plantar foot with dry dark colored scabbed callous; .3X.3cm, removes easily when cleansed, revealing pink dry intact skin Left plantar great toe with dry dark colored dry intact blister; 1.5X1.5cm Left anterior foot with dry dark colored dry scabbed callous; 1.5X1.5cm Left outer foot with dry dark colored dry scabbed callous; .3X.3cm Dressing procedure/placement/frequency: Continue present plan of care as ordered by podiatry. topical treatment orders provided for bedside nurses to perform as follows: Leave dry callous areas to left foot open to air and wipe with skin prep Q day Please re-consult if further assistance is needed.  Thank-you,  Cammie Mcgee MSN, RN, CWOCN, [place], CNS [phone]

## 2022-03-08 NOTE — Plan of Care (Signed)
  Problem: Activity: Goal: Ability to tolerate increased activity will improve Outcome: Progressing   Problem: Respiratory: Goal: Ability to maintain a clear airway and adequate ventilation will improve Outcome: Progressing   Problem: Role Relationship: Goal: Method of communication will improve Outcome: Progressing   Problem: Education: Goal: Knowledge of General Education information will improve Description: Including pain rating scale, medication(s)/side effects and non-pharmacologic comfort measures Outcome: Progressing   Problem: Health Behavior/Discharge Planning: Goal: Ability to manage health-related needs will improve Outcome: Progressing   Problem: Clinical Measurements: Goal: Ability to maintain clinical measurements within normal limits will improve Outcome: Progressing Goal: Will remain free from infection Outcome: Progressing Goal: Diagnostic test results will improve Outcome: Progressing Goal: Respiratory complications will improve Outcome: Progressing Goal: Cardiovascular complication will be avoided Outcome: Progressing   Problem: Activity: Goal: Risk for activity intolerance will decrease Outcome: Progressing   Problem: Nutrition: Goal: Adequate nutrition will be maintained Outcome: Progressing   Problem: Coping: Goal: Level of anxiety will decrease Outcome: Progressing   Problem: Elimination: Goal: Will not experience complications related to bowel motility Outcome: Progressing Goal: Will not experience complications related to urinary retention Outcome: Progressing   Problem: Pain Managment: Goal: General experience of comfort will improve Outcome: Progressing   Problem: Safety: Goal: Ability to remain free from injury will improve Outcome: Progressing   Problem: Skin Integrity: Goal: Risk for impaired skin integrity will decrease Outcome: Progressing   Problem: Education: Goal: Ability to describe self-care measures that may  prevent or decrease complications (Diabetes Survival Skills Education) will improve Outcome: Progressing Goal: Individualized Educational Video(s) Outcome: Progressing   Problem: Coping: Goal: Ability to adjust to condition or change in health will improve Outcome: Progressing   Problem: Fluid Volume: Goal: Ability to maintain a balanced intake and output will improve Outcome: Progressing   Problem: Health Behavior/Discharge Planning: Goal: Ability to identify and utilize available resources and services will improve Outcome: Progressing Goal: Ability to manage health-related needs will improve Outcome: Progressing   Problem: Metabolic: Goal: Ability to maintain appropriate glucose levels will improve Outcome: Progressing   Problem: Nutritional: Goal: Maintenance of adequate nutrition will improve Outcome: Progressing Goal: Progress toward achieving an optimal weight will improve Outcome: Progressing   Problem: Skin Integrity: Goal: Risk for impaired skin integrity will decrease Outcome: Progressing   Problem: Tissue Perfusion: Goal: Adequacy of tissue perfusion will improve Outcome: Progressing   Problem: Safety: Goal: Non-violent Restraint(s) Outcome: Progressing

## 2022-03-08 NOTE — Evaluation (Signed)
Occupational Therapy Evaluation Patient Details Name: Caleb Cuevas MRN: [id number] DOB: 1946/07/19 Today's Date: 03/08/2022   History of Present Illness 76 yo male presents to [facility] on 7/31 with respiratory distress, RUE weakness. 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 ETT 7/31-8/1. recent admission 7/20 for HF. PMHx significant for DMII, HTN, HLD, PAD, afib, Hx of TIA and cerebral aneurysm.   Clinical Impression   Pt lives with his wife and ambulates with a cane. His wife helps him in and out of the shower and with all aspects of dressing. Pt enjoys vegetable gardening. Pt presents with generalized weakness, impaired standing balance and longstanding B hand incoordination and L UE weakness. Pt requires set up to max assist for ADLs and min assist to ambulate with RW. Pt able to maintain Sp02>92% on 2L 02 throughout session. He endorses B LE buckling at home an keeps seating throughout his home for rest breaks. Recommending HHOT upon discharge, pt in agreement.      Recommendations for follow up therapy are one component of a multi-disciplinary discharge planning process, led by the attending physician.  Recommendations may be updated based on patient status, additional functional criteria and insurance authorization.   Follow Up Recommendations  Home health OT    Assistance Recommended at Discharge Frequent or constant Supervision/Assistance  Patient can return home with the following A little help with walking and/or transfers;A lot of help with bathing/dressing/bathroom;Assistance with cooking/housework;Direct supervision/assist for financial management;Assist for transportation;Help with stairs or ramp for entrance    Functional Status Assessment  Patient has had a recent decline in their functional status  and demonstrates the ability to make significant improvements in function in a reasonable and predictable amount of time.  Equipment Recommendations  BSC/3in1 (prefers oblong type ([facility]?))    Recommendations for Other Services       Precautions / Restrictions Precautions Precautions: Fall Restrictions Weight Bearing Restrictions: No      Mobility Bed Mobility Overal bed mobility: Needs Assistance Bed Mobility: Supine to Sit, Sit to Supine     Supine to sit: Min assist Sit to supine: Min assist   General bed mobility comments: increased time and effort, use of rai, min assist for trunk with supine>sit and for LEs sit>supine    Transfers Overall transfer level: Needs assistance Equipment used: Rolling walker (2 wheels) Transfers: Sit to/from Stand Sit to Stand: Min assist, From elevated surface           General transfer comment: steadying assist, pt with difficulty rising from low surfaces at baseline      Balance Overall balance assessment: Needs assistance   Sitting balance-Leahy Scale: Fair     Standing balance support: Bilateral upper extremity supported, During functional activity Standing balance-Leahy Scale: Poor Standing balance comment: reliant on external support and RW                           ADL either performed or assessed with clinical judgement   ADL Overall ADL's : Needs assistance/impaired Eating/Feeding: Independent;Sitting   Grooming: Set up;Sitting;Wash/dry hands;Wash/dry face;Oral care   Upper Body Bathing: Minimal assistance;Sitting   Lower Body Bathing: Maximal assistance;Sit to/from stand   Upper Body Dressing : Minimal assistance;Sitting   Lower Body Dressing: Maximal assistance;Sit to/from stand   Toilet Transfer: Minimal assistance;Ambulation;Rolling walker (2 wheels)  Toilet Transfer Details (indicate cue type and reason): recommended 3 in 1 from [place]         Functional mobility during ADLs: Minimal  assistance;Rolling walker (2 wheels)       Vision Baseline Vision/History: 1 Wears glasses Ability to See in Adequate Light: 0 Adequate Patient Visual Report: No change from baseline       Perception     Praxis      Pertinent Vitals/Pain Pain Assessment Pain Assessment: No/denies pain     Hand Dominance Right   Extremity/Trunk Assessment Upper Extremity Assessment Upper Extremity Assessment: LUE deficits/detail;RUE deficits/detail RUE Deficits / Details: longstanding fine motor deficits RUE Coordination: decreased fine motor (baseline) LUE Deficits / Details: hx of CVA with mild residual deficits LUE Coordination: decreased fine motor;decreased gross motor   Lower Extremity Assessment Lower Extremity Assessment: Defer to PT evaluation   Cervical / Trunk Assessment Cervical / Trunk Assessment: Normal   Communication Communication Communication: No difficulties   Cognition Arousal/Alertness: Awake/alert Behavior During Therapy: WFL for tasks assessed/performed Overall Cognitive Status: Within Functional Limits for tasks assessed                                 General Comments: some decreased medical literacy     General Comments       Exercises     Shoulder Instructions      Home Living Family/patient expects to be discharged to:: Private residence Living Arrangements: Spouse/significant other Available Help at Discharge: Family;Available 24 hours/day;Neighbor Type of Home: House Home Access: Stairs to enter [employer] of Steps: 1 step from garage, level entry through front door   Home Layout: Two level;Able to live on main level with bedroom/bathroom     Bathroom Shower/Tub: Walk-in shower;Tub/shower unit   Bathroom Toilet: Handicapped height     Home Equipment: Rollator (4 wheels);Cane - single point;Wheelchair - manual;Grab bars - toilet;Grab bars - tub/shower;Shower seat - built in          Prior  Functioning/Environment Prior Level of Function : Needs assist;History of Falls (last six months)             Mobility Comments: pt states he uses cane as needed in the home, uses w/c for to/from appointments as legs "give way" and lead to falls ADLs Comments: wife assists with shower transfer and does most of his dressing, wears easily donned clothing, likes to garden        OT Problem List: Decreased strength;Decreased activity tolerance;Impaired balance (sitting and/or standing);Decreased coordination;Decreased knowledge of use of DME or AE;Cardiopulmonary status limiting activity      OT Treatment/Interventions: Self-care/ADL training;DME and/or AE instruction;Therapeutic activities;Patient/family education;Balance training    OT Goals(Current goals can be found in the care plan section) Acute Rehab OT Goals OT Goal Formulation: With patient Time For Goal Achievement: 03/22/22 Potential to Achieve Goals: Good ADL Goals Pt Will Perform Grooming: with min guard assist (2 activities) Pt Will Transfer to Toilet: with min guard assist;ambulating;bedside commode Pt Will Perform Toileting - Clothing Manipulation and hygiene: with min guard assist;sit to/from stand Additional ADL Goal #1: Pt will perform bed mobility with supervision in preparation for ADLs.  OT Frequency: Min 2X/week    Co-evaluation              AM-PAC OT "6 Clicks" Daily Activity     Outcome Measure Help from another person eating meals?: None Help from another person taking  care of personal grooming?: A Little Help from another person toileting, which includes using toliet, bedpan, or urinal?: A Little Help from another person bathing (including washing, rinsing, drying)?: A Lot Help from another person to put on and taking off regular upper body clothing?: A Lot Help from another person to put on and taking off regular lower body clothing?: A Lot 6 Click Score: 16   End of Session Equipment Utilized  During Treatment: Rolling walker (2 wheels);Gait belt;Oxygen (2L)  Activity Tolerance: Patient tolerated treatment well Patient left: in bed;with call bell/phone within reach;with bed alarm set;with family/visitor present  OT Visit Diagnosis: Unsteadiness on feet (R26.81);Other abnormalities of gait and mobility (R26.89);Muscle weakness (generalized) (M62.81);Hemiplegia and hemiparesis Hemiplegia - Right/Left: Left Hemiplegia - dominant/non-dominant: Non-Dominant Hemiplegia - caused by: Cerebral infarction                Time: 1410-1450 OT Time Calculation (min): 40 min Charges:  OT General Charges $OT Visit: 1 Visit OT Evaluation $OT Eval Moderate Complexity: 1 Mod OT Treatments $Self Care/Home Management : 23-37 mins  Berna Spare, OTR/L Acute Rehabilitation Services Office: [phone]  Evern Bio 03/08/2022, 3:12 PM

## 2022-03-09 ENCOUNTER — Other Ambulatory Visit (HOSPITAL_COMMUNITY): Payer: Self-pay

## 2022-03-09 DIAGNOSIS — E119 Type 2 diabetes mellitus without complications: Secondary | ICD-10-CM

## 2022-03-09 DIAGNOSIS — J9602 Acute respiratory failure with hypercapnia: Secondary | ICD-10-CM | POA: Diagnosis not present

## 2022-03-09 DIAGNOSIS — R531 Weakness: Secondary | ICD-10-CM | POA: Diagnosis not present

## 2022-03-09 DIAGNOSIS — J9601 Acute respiratory failure with hypoxia: Secondary | ICD-10-CM | POA: Diagnosis not present

## 2022-03-09 LAB — BASIC METABOLIC PANEL
Anion gap: 15 (ref 5–15)
BUN: 51 mg/dL — ABNORMAL HIGH (ref 8–23)
CO2: 24 mmol/L (ref 22–32)
Calcium: 8.6 mg/dL — ABNORMAL LOW (ref 8.9–10.3)
Chloride: 102 mmol/L (ref 98–111)
Creatinine, Ser: 2.83 mg/dL — ABNORMAL HIGH (ref 0.61–1.24)
GFR, Estimated: 23 mL/min — ABNORMAL LOW (ref >60)
Glucose, Bld: 102 mg/dL — ABNORMAL HIGH (ref 70–99)
Potassium: 3.9 mmol/L (ref 3.5–5.1)
Sodium: 141 mmol/L (ref 135–145)

## 2022-03-09 LAB — CBC
HCT: 34.2 % — ABNORMAL LOW (ref 39.0–52.0)
Hemoglobin: 11.4 g/dL — ABNORMAL LOW (ref 13.0–17.0)
MCH: 27.7 pg (ref 26.0–34.0)
MCHC: 33.3 g/dL (ref 30.0–36.0)
MCV: 83 fL (ref 80.0–100.0)
Platelets: 162 10*3/uL (ref 150–400)
RBC: 4.12 MIL/uL — ABNORMAL LOW (ref 4.22–5.81)
RDW: 14.3 % (ref 11.5–15.5)
WBC: 10.1 10*3/uL (ref 4.0–10.5)
nRBC: 0 % (ref 0.0–0.2)

## 2022-03-09 LAB — GLUCOSE, CAPILLARY
Glucose-Capillary: 114 mg/dL — ABNORMAL HIGH (ref 70–99)
Glucose-Capillary: 136 mg/dL — ABNORMAL HIGH (ref 70–99)
Glucose-Capillary: 202 mg/dL — ABNORMAL HIGH (ref 70–99)

## 2022-03-09 MED ORDER — INSULIN GLARGINE-YFGN 100 UNIT/ML ~~LOC~~ SOPN: 24.0000 [IU] | PEN_INJECTOR | Freq: Every day | SUBCUTANEOUS | 3 refills | Status: AC

## 2022-03-09 MED ORDER — INSULIN ASPART 100 UNIT/ML FLEXPEN: PEN_INJECTOR | SUBCUTANEOUS | 5 refills | Status: AC

## 2022-03-09 MED ORDER — AMLODIPINE BESYLATE 5 MG PO TABS
5.0000 mg | ORAL_TABLET | Freq: Every day | ORAL | Status: DC
  Administered 2022-03-09: 5 mg via ORAL
  Filled 2022-03-09: qty 1

## 2022-03-09 MED ORDER — AMLODIPINE BESYLATE 5 MG PO TABS
5.0000 mg | ORAL_TABLET | Freq: Every day | ORAL | 2 refills | Status: AC
Start: 2022-03-09 — End: ?

## 2022-03-09 MED ORDER — ATORVASTATIN CALCIUM 40 MG PO TABS
40.0000 mg | ORAL_TABLET | Freq: Every day | ORAL | 2 refills | Status: DC
  Filled 2022-03-09: qty 30, 30d supply, fill #0

## 2022-03-09 MED ORDER — INSULIN GLARGINE-YFGN 100 UNIT/ML ~~LOC~~ SOPN
24.0000 [IU] | PEN_INJECTOR | Freq: Every day | SUBCUTANEOUS | 3 refills | Status: DC
  Filled 2022-03-09: qty 15, 62d supply, fill #0

## 2022-03-09 MED ORDER — ATORVASTATIN CALCIUM 40 MG PO TABS
40.0000 mg | ORAL_TABLET | Freq: Every day | ORAL | 2 refills | Status: AC
Start: 2022-03-09 — End: ?

## 2022-03-09 MED ORDER — AMLODIPINE BESYLATE 5 MG PO TABS
5.0000 mg | ORAL_TABLET | Freq: Every day | ORAL | 2 refills | Status: DC
  Filled 2022-03-09: qty 30, 30d supply, fill #0

## 2022-03-09 MED ORDER — INSULIN ASPART 100 UNIT/ML FLEXPEN
PEN_INJECTOR | SUBCUTANEOUS | 5 refills | Status: DC
  Filled 2022-03-09: qty 15, fill #0
  Filled 2022-03-09: qty 15, 83d supply, fill #0

## 2022-03-09 NOTE — Discharge Summary (Signed)
PATIENT DETAILS Name: Caleb Cuevas Age: 76 y.o. Sex: male Date of Birth: 1946/08/06 MRN: [id number]. Admitting Physician: Audria Nine, DO [id number], [place]  Admit Date: 03/06/2022 Discharge date: 03/09/2022  Recommendations for Outpatient Follow-up:  Follow up with PCP in 1-2 weeks Please obtain CMP/CBC in one week  Admitted From:  Home  Disposition: Home health   Discharge Condition: fair  CODE STATUS:   Code Status: Full Code   Diet recommendation:  Diet Order             Diet - low sodium heart healthy           Diet Carb Modified           Diet heart healthy/carb modified Room service appropriate? Yes; Fluid consistency: Thin  Diet effective now                    Brief Summary: Patient is a 76 y.o.  male with past medical history of chronic combined systolic/diastolic heart failure, A-fib on Eliquis, HTN, OSA, CKD stage IIIb-admitted with acute hypoxic/hypercarbic respiratory failure in the setting of hypertensive emergency causing acute pulmonary edema-patient was intubated-and subsequently admitted to the intensive care unit.  MRI brain demonstrated acute infarcts-he was stabilized-extubated and transferred to [facility] service.  See below for further details.  Significant events: 7/31>> hypoxic/hypercarbic respiratory failure-hypertensive emergency in the setting of pulmonary edema-intubated-admit to ICU. 8/1>> extubated.  MRI positive for acute infarct. 8/2>> transfer to [facility].  Brief episode of RVR-resolved spontaneously.   Significant studies: 7/31>> CT head: No acute intracranial abnormality. 7/31>> CT angio head/neck: Negative for large vessel occlusion/emergent findings.  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 7/31>> Echo: EF [id number], grade 1 diastolic dysfunction.  7/31>> A1c: 6.5 7/31>> EEG: No seizures. 8/01>> MRI brain: Acute infarct left insula, corpus callosum body. 8/01>> LDL: 115   Significant microbiology  data: 7/31>> respiratory virus panel: Negative 7/31>> blood culture: No growth 7/31>> sputum cultures: Pending   Procedures: 7/31-8/1>> ETT   Consults: Neurology, [facility] Course: Acute hypoxic and hypercarbic respiratory failure in the setting of acute pulm edema due to hypertensive emergency: Extubated-on 8/1-titrated to room air.     Acute metabolic encephalopathy: Due to hypoxia-uncontrolled hypertension-encephalopathy has resolved-currently awake and alert.    Hypertensive emergency: Presented with SBP in the 250s-when started on propofol for sedation-blood pressure dropped-he was managed with supportive care and as needed antihypertensives in the ICU.  See below.   Acute embolic CVA: Appreciate stroke MD input-work-up as above-Per stroke MD note-okay to switch back to Eliquis.  Was briefly on IV heparin.  Continue statin on discharge.   Acute on chronic CKD stage IIIb: AKI likely hemodynamically mediated-avoid nephrotoxic agents-creatinine has plateaued-repeat electrolytes in 1 week at PCPs office.  Continue to hold ARB.    Acute on chronic systolic/diastolic heart failure: Diuresed in the ICU-stable/compensated currently.   PAF with RVR: Had a brief episode of RVR on 8/2-resolved spontaneously-maintaining sinus rhythm on beta-blocker.  Back on Eliquis.   PNA: Doubt PNA-suspect chest x-ray findings were consistent with pulmonary edema-all antimicrobial therapy was discontinued on 8/2.  Remains stable.  Sepsis physiology ruled out.     HTN: Presented with hypertensive emergency-BP now much better-continue metoprolol-add low-dose amlodipine.  Avoid olmesartan till kidney function can be reassessed next week at PCPs office.    DM-2: CBG stable on 12 units of Levemir twice daily while hospitalized-at home he takes 48 units of Semglee.  We we will  place him on 24 units of Semglee on discharge and have him follow-up with his PCP for further optimization.    4 mm anterior  communicating aneurysm: Per CT report-unchanged from prior-stable for outpatient follow-up with his neurologist.   Left foot wound: Continue wound care.    Nutrition Status: Nutrition Problem: Inadequate oral intake Etiology: inability to eat Signs/Symptoms: NPO status Interventions: Tube feeding   BMI: Estimated body mass index is 28.02 kg/m as calculated from the following:   Height as of this encounter: 6' (1.829 m).   Weight as of this encounter: 93.7 kg.   Nutrition Status: Nutrition Problem: Inadequate oral intake Etiology: inability to eat Signs/Symptoms: NPO status Interventions: Tube feeding   Discharge Diagnoses:  Principal Problem:   Hypertensive emergency   Discharge Instructions:  Activity:  As tolerated with Full fall precautions use walker/cane & assistance as needed Discharge Instructions     Call MD for:  difficulty breathing, headache or visual disturbances   Complete by: As directed    Call MD for:  redness, tenderness, or signs of infection (pain, swelling, redness, odor or green/yellow discharge around incision site)   Complete by: As directed    Diet - low sodium heart healthy   Complete by: As directed    Diet Carb Modified   Complete by: As directed    Discharge instructions   Complete by: As directed    Follow with Primary MD  Clinic, [facility] in 1-2 weeks   Please keep a record of your blood sugar readings-take it to your next appointment with your PCP.  Your insulin dosage has been adjusted as you are requiring significantly less amounts of insulin during this hospitalization.  Please follow-up with your PCP and adjust accordingly.  Your blood pressure medications have been adjusted-olmesartan has been on hold due to some kidney issues-you have been started on amlodipine.  Please ask your primary care practitioner to recheck your kidney function panel in 1 week-and see if olmesartan can be resumed.  Please ask your primary care  practitioner for a referral to nephrology.  Please get a complete blood count and chemistry panel checked by your Primary MD at your next visit, and again as instructed by your Primary MD.  Get Medicines reviewed and adjusted: Please take all your medications with you for your next visit with your Primary MD  Laboratory/radiological data: Please request your Primary MD to go over all hospital tests and procedure/radiological results at the follow up, please ask your Primary MD to get all Hospital records sent to his/her office.  In some cases, they will be blood work, cultures and biopsy results pending at the time of your discharge. Please request that your primary care M.D. follows up on these results.  Also Note the following: If you experience worsening of your admission symptoms, develop shortness of breath, life threatening emergency, suicidal or homicidal thoughts you must seek medical attention immediately by calling 911 or calling your MD immediately  if symptoms less severe.  You must read complete instructions/literature along with all the possible adverse reactions/side effects for all the Medicines you take and that have been prescribed to you. Take any new Medicines after you have completely understood and accpet all the possible adverse reactions/side effects.   Do not drive when taking Pain medications or sleeping medications (Benzodaizepines)  Do not take more than prescribed Pain, Sleep and Anxiety Medications. It is not advisable to combine anxiety,sleep and pain medications without talking with your primary care  practitioner  Special Instructions: If you have smoked or chewed Tobacco  in the last 2 yrs please stop smoking, stop any regular Alcohol  and or any Recreational drug use.  Wear Seat belts while driving.  Please note: You were cared for by a hospitalist during your hospital stay. Once you are discharged, your primary care physician will handle any further  medical issues. Please note that NO REFILLS for any discharge medications will be authorized once you are discharged, as it is imperative that you return to your primary care physician (or establish a relationship with a primary care physician if you do not have one) for your post hospital discharge needs so that they can reassess your need for medications and monitor your lab values.   Discharge wound care:   Complete by: As directed    Leave dry callous areas to left foot open to air and wipe with skin prep Q day   Increase activity slowly   Complete by: As directed       Allergies as of 03/09/2022   No Known Allergies      Medication List     STOP taking these medications    olmesartan 20 MG tablet Commonly known as: BENICAR       TAKE these medications    amLODipine 5 MG tablet Commonly known as: NORVASC Take 1 tablet (5 mg total) by mouth daily.   apixaban 5 MG Tabs tablet Commonly known as: Eliquis Take 1 tablet (5 mg total) by mouth 2 (two) times daily.   atorvastatin 40 MG tablet Commonly known as: LIPITOR Take 1 tablet (40 mg total) by mouth daily.   DULoxetine 60 MG capsule Commonly known as: Cymbalta Take 1 capsule (60 mg total) by mouth 2 (two) times daily.   Glucerna Liqd Take 237 mLs by mouth daily.   insulin aspart 100 UNIT/ML FlexPen Commonly known as: NOVOLOG Inject 6 units into the skin in the morning, inject 6 units into the skin at Lunch and 6 units at [employer]. What changed: additional instructions   insulin glargine-yfgn 100 UNIT/ML Pen Commonly known as: SEMGLEE Inject 24 Units into the skin at bedtime. What changed: how much to take   lidocaine 5 % Commonly known as: LIDODERM Place 1 patch onto the skin daily as needed (pain).   metoprolol succinate 50 MG 24 hr tablet Commonly known as: TOPROL-XL Take 1 tablet (50 mg total) by mouth 2 (two) times daily. Take with or immediately following a meal.   mupirocin ointment 2 % Commonly  known as: BACTROBAN Apply 1 Application topically daily.   omeprazole 20 MG capsule Commonly known as: PRILOSEC Take 20 mg by mouth daily.   One-A-Day Mens 50+ Tabs Take 1 tablet by mouth daily.   Spiriva Respimat 2.5 MCG/ACT Aers Generic drug: Tiotropium Bromide Monohydrate Inhale 2 puffs into the lungs daily.               Discharge Care Instructions  (From admission, onward)           Start     Ordered   03/09/22 0000  Discharge wound care:       Comments: Leave dry callous areas to left foot open to air and wipe with skin prep Q day   03/09/22 0911            Follow-up Information     Clinic, [facility] Schedule an appointment as soon as possible for a visit in 1 week(s).   Contact  information: [place] [address] [id number]         Minus Breeding, MD Follow up in 1 month(s).   Specialty: Cardiology Contact information: [address] [place] [address] [phone]                No Known Allergies   Other Procedures/Studies: DG Chest Port 1 View  Result Date: 03/08/2022 CLINICAL DATA:  Recent extubation. EXAM: PORTABLE CHEST 1 VIEW COMPARISON:  Chest radiograph March 07, 2022. FINDINGS: EKG leads project over the chest. Interval extubation and removal of the nasogastric tube. The heart size and mediastinal contours are unchanged. Improved aeration of the lungs with decreased interstitial opacities. Persistent obscuration of the left hemidiaphragm. Stable layering small left pleural effusion. The visualized skeletal structures are unchanged. IMPRESSION: 1. Interval extubation and removal of the nasogastric tube. 2. Improved aeration of the lungs with decreased interstitial and patchy opacities. 3. Stable layering small left pleural effusion. Electronically Signed   By: Dahlia Bailiff M.D.   On: 03/08/2022 08:33   DG Abd Portable 1V  Result Date: 03/07/2022 CLINICAL DATA:   Possible metal prior to MRI. EXAM: PORTABLE ABDOMEN - 1 VIEW COMPARISON:  None Available. FINDINGS: The bowel gas pattern is normal. Nasogastric tube tip is seen in expected position of distal stomach. Probable small left renal calculus is noted. No other definite radiopaque foreign body is noted. IMPRESSION: Probable small left renal calculus. Nasogastric tube tip seen in expected position of distal stomach. Electronically Signed   By: Marijo Conception M.D.   On: 03/07/2022 08:16   MR BRAIN WO CONTRAST  Result Date: 03/07/2022 CLINICAL DATA:  Acute onset shortness of breath. EXAM: MRI HEAD WITHOUT CONTRAST TECHNIQUE: Multiplanar, multiecho pulse sequences of the brain and surrounding structures were obtained without intravenous contrast. COMPARISON:  CT and CTA from yesterday FINDINGS: Brain: Small acute infarct below the left insula. Weakly restricted diffusion at the right corpus callosum body. Ischemic gliosis in the cerebral white matter and to a limited degree in the brainstem. No hemorrhage, hydrocephalus, or collection. Calcification or chronic blood products in the left occipital white matter, nonspecific in isolation. No acute hemorrhage, hydrocephalus, or masslike finding. Vascular: Major flow voids are preserved Skull and upper cervical spine: Normal marrow signal Sinuses/Orbits: Negative.  Bilateral cataract resection. IMPRESSION: 1. Small acute infarct below the left insula. Small subacute infarct at the corpus callosum body. 2. Mild chronic small vessel ischemia in the cerebral white matter. Electronically Signed   By: Jorje Guild M.D.   On: 03/07/2022 07:26   DG Chest Port 1 View  Result Date: 03/07/2022 CLINICAL DATA:  76 year old male with history of acute hypoxic respiratory failure. EXAM: PORTABLE CHEST 1 VIEW COMPARISON:  Chest x-ray 03/06/2022. FINDINGS: An endotracheal tube is in place with tip 4.2 cm above the carina. A nasogastric tube is seen extending into the stomach, however,  the tip of the nasogastric tube extends below the lower margin of the image. Lung volumes are slightly low. Widespread areas of interstitial prominence and ill-defined opacities are evident throughout the mid to lower lungs bilaterally, particularly in the lung bases where there is extensive atelectasis and/or consolidation. Complete obscuration of the left hemidiaphragm and blunting the left costophrenic sulcus indicative of a small to moderate left pleural effusion. No definite right pleural effusion. Pulmonary vasculature is largely obscured but does not appear engorged. Heart size is normal. Upper mediastinal contours are within normal limits. IMPRESSION: 1. Support apparatus, as above.  2. Findings remain most concerning for severe multilobar bilateral pneumonia. 3. Small to moderate left pleural effusion increasingly apparent. Electronically Signed   By: Vinnie Langton M.D.   On: 03/07/2022 05:32   ECHOCARDIOGRAM COMPLETE BUBBLE STUDY  Result Date: 03/06/2022    ECHOCARDIOGRAM REPORT   Patient Name:   Caleb Cuevas Date of Exam: 03/06/2022 Medical Rec #:  [id number]    Height:       72.0 in Accession #:    [id number]   Weight:       233.5 lb Date of Birth:  26-Apr-1946    BSA:          2.275 m Patient Age:    26 years     BP:           146/68 mmHg Patient Gender: M            HR:           72 bpm. Exam Location:  Inpatient Procedure: 2D Echo, Cardiac Doppler, Color Doppler and Saline Contrast Bubble            Study Indications:    R55 Syncope  History:        Patient has prior history of Echocardiogram examinations, most                 recent 02/14/2021. CHF, Arrythmias:Atrial Flutter and Atrial                 Fibrillation; Risk Factors:Diabetes, Sleep Apnea and                 Hypertension.  Sonographer:    Bernadene Person RDCS Referring Phys: [id number] [place]  Sonographer Comments: Echo performed with patient supine and on artificial respirator. IMPRESSIONS  1. Study limited due to poor echo  windows.  2. Left ventricular ejection fraction, by estimation, is 50 to 55%. The left ventricle has low normal function. Left ventricular endocardial border not optimally defined to evaluate regional wall motion. Left ventricular diastolic parameters are consistent with Grade I diastolic dysfunction (impaired relaxation).  3. Right ventricular systolic function is normal. The right ventricular size is normal. Tricuspid regurgitation signal is inadequate for assessing PA pressure.  4. The mitral valve is grossly normal. Mild mitral valve regurgitation.  5. The aortic valve was not well visualized. Aortic valve regurgitation is not visualized. No aortic stenosis is present.  6. Aortic dilatation noted. There is borderline dilatation of the ascending aorta, measuring 38 mm.  7. Agitated saline contrast bubble study was negative, with no evidence of any interatrial shunt. Comparison(s): Compared to prior study on 02/2021, the current study is limited due to poor echo windows. Grossly, the LVEF appears slightly improved although wall motion cannot be assess on current study (prior EF 45-50%). The diastolic function has improved from grade 3 diastolic dysfunction on prior study to grade 1. FINDINGS  Left Ventricle: Left ventricular ejection fraction, by estimation, is 50 to 55%. The left ventricle has low normal function. Left ventricular endocardial border not optimally defined to evaluate regional wall motion. The left ventricular internal cavity  size was normal in size. There is no left ventricular hypertrophy. Left ventricular diastolic parameters are consistent with Grade I diastolic dysfunction (impaired relaxation). Right Ventricle: The right ventricular size is normal. No increase in right ventricular wall thickness. Right ventricular systolic function is normal. Tricuspid regurgitation signal is inadequate for assessing PA pressure. Left Atrium: Left atrial size was not well visualized. Right Atrium: Right  atrial size was not well visualized. Pericardium: There is no evidence of pericardial effusion. Mitral Valve: The mitral valve is grossly normal. Mild mitral valve regurgitation. Tricuspid Valve: The tricuspid valve is normal in structure. Tricuspid valve regurgitation is trivial. Aortic Valve: The aortic valve was not well visualized. Aortic valve regurgitation is not visualized. No aortic stenosis is present. Pulmonic Valve: The pulmonic valve was not well visualized. Aorta: The aortic root is normal in size and structure and aortic dilatation noted. There is borderline dilatation of the ascending aorta, measuring 38 mm. Venous: IVC assessment for right atrial pressure unable to be performed due to mechanical ventilation. IAS/Shunts: The atrial septum is grossly normal. Agitated saline contrast was given intravenously to evaluate for intracardiac shunting. Agitated saline contrast bubble study was negative, with no evidence of any interatrial shunt.  LEFT VENTRICLE PLAX 2D LVIDd:         5.00 cm   Diastology LVIDs:         3.90 cm   LV e' medial:    4.56 cm/s LV PW:         1.00 cm   LV E/e' medial:  15.6 LV IVS:        0.80 cm   LV e' lateral:   8.27 cm/s LVOT diam:     2.10 cm   LV E/e' lateral: 8.6 LV SV:         53 LV SV Index:   23 LVOT Area:     3.46 cm  RIGHT VENTRICLE RV S prime:     12.20 cm/s TAPSE (M-mode): 1.8 cm LEFT ATRIUM             Index        RIGHT ATRIUM           Index LA diam:        3.40 cm 1.49 cm/m   RA Area:     14.10 cm LA Vol (A2C):   36.2 ml 15.91 ml/m  RA Volume:   32.30 ml  14.20 ml/m LA Vol (A4C):   33.7 ml 14.81 ml/m LA Biplane Vol: 35.9 ml 15.78 ml/m  AORTIC VALVE LVOT Vmax:   77.60 cm/s LVOT Vmean:  48.900 cm/s LVOT VTI:    0.154 m  AORTA Ao Root diam: 3.50 cm Ao Asc diam:  3.80 cm MITRAL VALVE MV Area (PHT): 3.72 cm    SHUNTS MV Decel Time: 204 msec    Systemic VTI:  0.15 m MV E velocity: 71.00 cm/s  Systemic Diam: 2.10 cm MV A velocity: 82.50 cm/s MV E/A ratio:  0.86  Gwyndolyn Kaufman MD Electronically signed by Gwyndolyn Kaufman MD Signature Date/Time: 03/06/2022/4:19:26 PM    Final    EEG adult  Result Date: 03/06/2022 Lora Havens, MD     03/06/2022  3:46 PM Patient Name: Caleb Cuevas MRN: [id number] Epilepsy Attending: Lora Havens Referring Physician/Provider: Garvin Fila, MD Date: 03/06/2022 Duration: 22.32 mins Patient history: 76 year old male with atrial fibrillation on Eliquis presenting via EMS with acute hypoxic respiratory failure and right hemiplegia with leftward gaze deviation and expressive aphasia.  EEG to evaluate for seizure. Level of alertness: Awake, asleep AEDs during EEG study: None Technical aspects: This EEG study was done with scalp electrodes positioned according to the 10-20 International system of electrode placement. Electrical activity was reviewed with band pass filter of 1-70Hz , sensitivity of 7 uV/mm, display speed of 2mm/sec with a 60Hz  notched filter applied as appropriate. EEG data  were recorded continuously and digitally stored.  Video monitoring was available and reviewed as appropriate. Description: The posterior dominant rhythm consists of 8Hz  activity of moderate voltage (25-35 uV) seen predominantly in posterior head regions, symmetric and reactive to eye opening and eye closing. Sleep was characterized by vertex waves, sleep spindles (12 to 14 Hz), maximal frontocentral region. Hyperventilation and photic stimulation were not performed.   IMPRESSION: This study is within normal limits. No seizures or epileptiform discharges were seen throughout the recording. Priyanka Barbra Sarks   CT HEAD CODE STROKE WO CONTRAST  Result Date: 03/06/2022 CLINICAL DATA:  Initial evaluation for neuro deficit, stroke suspected. EXAM: CT ANGIOGRAPHY HEAD AND NECK TECHNIQUE: Multidetector CT imaging of the head and neck was performed using the standard protocol during bolus administration of intravenous contrast. Multiplanar CT image  reconstructions and MIPs were obtained to evaluate the vascular anatomy. Carotid stenosis measurements (when applicable) are obtained utilizing NASCET criteria, using the distal internal carotid diameter as the denominator. RADIATION DOSE REDUCTION: This exam was performed according to the departmental dose-optimization program which includes automated exposure control, adjustment of the mA and/or kV according to patient size and/or use of iterative reconstruction technique. CONTRAST:  57mL OMNIPAQUE IOHEXOL 350 MG/ML SOLN COMPARISON:  Prior Study from 09/23/2021. FINDINGS: CT HEAD FINDINGS Brain: Age-related cerebral atrophy with moderate chronic microvascular ischemic disease. Probable small remote lacunar infarct at the posterior left frontal centrum semi ovale. No acute intracranial hemorrhage. No acute large vessel territory infarct. No mass lesion, mass effect or midline shift. No hydrocephalus or extra-axial fluid collection. Vascular: No hyperdense vessel. Calcified atherosclerosis present at skull base. Skull: Scalp soft tissues demonstrate no acute finding. Calvarium intact. Sinuses/Orbits: Globes orbital soft tissues demonstrate no acute finding. Paranasal sinuses are largely clear. Right mastoid and middle ear effusion, similar to prior, and likely chronic. Other: None. ASPECTS [facility] Stroke Program Early CT Score) - Ganglionic level infarction (caudate, lentiform nuclei, internal capsule, insula, M1-M3 cortex): 7 - Supraganglionic infarction (M4-M6 cortex): 3 Total score (0-10 with 10 being normal): 10 CTA NECK FINDINGS Aortic arch: Aortic arch and origin of the great vessels not included or assessed on this examination. Right carotid system: Visualized right CCA widely patent. Atheromatous irregularity about the right carotid bulb/proximal right ICA without hemodynamically significant greater than 50% stenosis. Right ICA patent distally without stenosis or dissection. Left carotid system: Visualized  left CCA widely patent. Atheromatous irregularity about the left carotid bulb/proximal left ICA without hemodynamically significant greater than 50% stenosis. Left ICA patent distally without stenosis or dissection. Vertebral arteries: Both vertebral arteries arise from the subclavian arteries. Vertebral arteries irregular but patent within the neck without stenosis or dissection. Skeleton: No discrete or worrisome osseous lesions. Moderate to advanced multilevel cervical spondylosis, most pronounced at C3-4. Other neck: Endotracheal and enteric tubes in place. No other acute soft tissue abnormality within the neck. 1.2 cm nodular lesion within the subcutaneous fat of the left suboccipital region, nonspecific, but likely a sebaceous cyst (series 10, image 90). Additionally, asymmetric soft tissue density noted within the subcutaneous fat overlying the right trapezius muscle (series 10, image 142), nonspecific. Upper chest: Large layering bilateral pleural effusions, right greater than left, partially visualized. Associated atelectasis. Additional ground-glass opacity with interlobular septal thickening within the visualized lungs, conchae suggesting pulmonary interstitial edema. Superimposed infiltrates difficult to exclude. Review of the MIP images confirms the above findings CTA HEAD FINDINGS Anterior circulation: Petrous segments patent bilaterally. Atheromatous change seen throughout the carotid siphons with associated up to  moderate multifocal narrowing, worse on the left. Right A1 segment patent. Left A1 hypoplastic and/or absent, accounting for the diminutive left ICA is compared to the right. 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 ACAs widely patent distally. No M1 stenosis or occlusion. No proximal MCA branch occlusion. Distal MCA branches perfused and fairly symmetric, although demonstrates small vessel atheromatous  irregularity. Posterior circulation: Dominant left V4 segment patent without significant stenosis. Multifocal atheromatous irregularity about the diminutive right ICA with up to moderate stenosis. Both PICA patent. Basilar patent to its distal aspect without significant stenosis. Superior cerebral arteries patent bilaterally. Both PCAs primarily supplied via the basilar. Atheromatous irregularity within the left PCA without proximal high-grade stenosis. On the right, there is a focal moderate to severe right P2 stenosis (series 13, image 117). Right PCA otherwise patent to its distal aspect. Venous sinuses: Grossly patent allowing for timing the contrast bolus. Anatomic variants: As above. Review of the MIP images confirms the above findings IMPRESSION: CT HEAD: 1. No acute intracranial abnormality. 2. Aspects equals 10. 3. Age-related cerebral atrophy with moderate chronic microvascular ischemic disease. CTA HEAD AND NECK: 1. Negative CTA for large vessel occlusion or other emergent finding. 2. Atheromatous change about the carotid siphons with associated moderate multifocal narrowing, worse on the left. 3. Short-segment moderate to severe right P2 stenosis. 4. Atheromatous change about the non dominant right V4 segment with associated moderate multifocal stenoses. 5. 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 6. Layering bilateral pleural effusions with superimposed pulmonary interstitial edema, consistent with CHF. Superimposed infiltrates difficult to exclude, and could be considered in the correct clinical setting. 7. Asymmetric soft tissue density within the subcutaneous fat overlying the right trapezius muscle, nonspecific, and of uncertain etiology or significance. Correlation with physical exam recommended. Results discussed by telephone at the time of interpretation on 03/06/2022 at 2:49 am to provider ERIC [facility] , who verbally acknowledged these results. Electronically Signed    By: Jeannine Boga M.D.   On: 03/06/2022 03:34   CT ANGIO HEAD NECK W WO CM (CODE STROKE)  Result Date: 03/06/2022 CLINICAL DATA:  Initial evaluation for neuro deficit, stroke suspected. EXAM: CT ANGIOGRAPHY HEAD AND NECK TECHNIQUE: Multidetector CT imaging of the head and neck was performed using the standard protocol during bolus administration of intravenous contrast. Multiplanar CT image reconstructions and MIPs were obtained to evaluate the vascular anatomy. Carotid stenosis measurements (when applicable) are obtained utilizing NASCET criteria, using the distal internal carotid diameter as the denominator. RADIATION DOSE REDUCTION: This exam was performed according to the departmental dose-optimization program which includes automated exposure control, adjustment of the mA and/or kV according to patient size and/or use of iterative reconstruction technique. CONTRAST:  35mL OMNIPAQUE IOHEXOL 350 MG/ML SOLN COMPARISON:  Prior Study from 09/23/2021. FINDINGS: CT HEAD FINDINGS Brain: Age-related cerebral atrophy with moderate chronic microvascular ischemic disease. Probable small remote lacunar infarct at the posterior left frontal centrum semi ovale. No acute intracranial hemorrhage. No acute large vessel territory infarct. No mass lesion, mass effect or midline shift. No hydrocephalus or extra-axial fluid collection. Vascular: No hyperdense vessel. Calcified atherosclerosis present at skull base. Skull: Scalp soft tissues demonstrate no acute finding. Calvarium intact. Sinuses/Orbits: Globes orbital soft tissues demonstrate no acute finding. Paranasal sinuses are largely clear. Right mastoid and middle ear effusion, similar to prior, and likely chronic. Other: None. ASPECTS [facility] Stroke Program Early CT Score) - Ganglionic level infarction (caudate,  lentiform nuclei, internal capsule, insula, M1-M3 cortex): 7 - Supraganglionic infarction (M4-M6 cortex): 3 Total score (0-10 with 10 being normal): 10  CTA NECK FINDINGS Aortic arch: Aortic arch and origin of the great vessels not included or assessed on this examination. Right carotid system: Visualized right CCA widely patent. Atheromatous irregularity about the right carotid bulb/proximal right ICA without hemodynamically significant greater than 50% stenosis. Right ICA patent distally without stenosis or dissection. Left carotid system: Visualized left CCA widely patent. Atheromatous irregularity about the left carotid bulb/proximal left ICA without hemodynamically significant greater than 50% stenosis. Left ICA patent distally without stenosis or dissection. Vertebral arteries: Both vertebral arteries arise from the subclavian arteries. Vertebral arteries irregular but patent within the neck without stenosis or dissection. Skeleton: No discrete or worrisome osseous lesions. Moderate to advanced multilevel cervical spondylosis, most pronounced at C3-4. Other neck: Endotracheal and enteric tubes in place. No other acute soft tissue abnormality within the neck. 1.2 cm nodular lesion within the subcutaneous fat of the left suboccipital region, nonspecific, but likely a sebaceous cyst (series 10, image 90). Additionally, asymmetric soft tissue density noted within the subcutaneous fat overlying the right trapezius muscle (series 10, image 142), nonspecific. Upper chest: Large layering bilateral pleural effusions, right greater than left, partially visualized. Associated atelectasis. Additional ground-glass opacity with interlobular septal thickening within the visualized lungs, conchae suggesting pulmonary interstitial edema. Superimposed infiltrates difficult to exclude. Review of the MIP images confirms the above findings CTA HEAD FINDINGS Anterior circulation: Petrous segments patent bilaterally. Atheromatous change seen throughout the carotid siphons with associated up to moderate multifocal narrowing, worse on the left. Right A1 segment patent. Left A1  hypoplastic and/or absent, accounting for the diminutive left ICA is compared to the right. 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 ACAs widely patent distally. No M1 stenosis or occlusion. No proximal MCA branch occlusion. Distal MCA branches perfused and fairly symmetric, although demonstrates small vessel atheromatous irregularity. Posterior circulation: Dominant left V4 segment patent without significant stenosis. Multifocal atheromatous irregularity about the diminutive right ICA with up to moderate stenosis. Both PICA patent. Basilar patent to its distal aspect without significant stenosis. Superior cerebral arteries patent bilaterally. Both PCAs primarily supplied via the basilar. Atheromatous irregularity within the left PCA without proximal high-grade stenosis. On the right, there is a focal moderate to severe right P2 stenosis (series 13, image 117). Right PCA otherwise patent to its distal aspect. Venous sinuses: Grossly patent allowing for timing the contrast bolus. Anatomic variants: As above. Review of the MIP images confirms the above findings IMPRESSION: CT HEAD: 1. No acute intracranial abnormality. 2. Aspects equals 10. 3. Age-related cerebral atrophy with moderate chronic microvascular ischemic disease. CTA HEAD AND NECK: 1. Negative CTA for large vessel occlusion or other emergent finding. 2. Atheromatous change about the carotid siphons with associated moderate multifocal narrowing, worse on the left. 3. Short-segment moderate to severe right P2 stenosis. 4. Atheromatous change about the non dominant right V4 segment with associated moderate multifocal stenoses. 5. 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 6. Layering bilateral pleural effusions with superimposed pulmonary interstitial edema, consistent with CHF. Superimposed infiltrates difficult to exclude, and could be considered in  the correct clinical setting. 7. Asymmetric soft tissue density within the subcutaneous fat overlying the right trapezius muscle, nonspecific, and of uncertain etiology or significance. Correlation with physical exam recommended. Results discussed by telephone at the time of interpretation on  03/06/2022 at 2:49 am to provider ERIC [facility] , who verbally acknowledged these results. Electronically Signed   By: Jeannine Boga M.D.   On: 03/06/2022 03:34   DG Chest Portable 1 View  Result Date: 03/06/2022 CLINICAL DATA:  Status post intubation EXAM: PORTABLE CHEST 1 VIEW COMPARISON:  02/13/2021 FINDINGS: Cardiac shadow is within normal limits. Endotracheal tube and gastric catheter are noted in satisfactory position. Bibasilar airspace opacities are noted consistent with multifocal pneumonia. No acute bony abnormality is noted. IMPRESSION: Bibasilar airspace opacities consistent with multifocal pneumonia. Tubes and lines in satisfactory position. Electronically Signed   By: Inez Catalina M.D.   On: 03/06/2022 02:39     TODAY-DAY OF DISCHARGE:  Subjective:   Caleb Cuevas today has no headache,no chest abdominal pain,no new weakness tingling or numbness, feels much better wants to go home today.  Objective:   Blood pressure (!) 158/79, pulse 81, temperature 98.4 F (36.9 C), temperature source Oral, resp. rate 16, height 6' (1.829 m), weight 93.7 kg, SpO2 96 %.  Intake/Output Summary (Last 24 hours) at 03/09/2022 0911 Last data filed at 03/09/2022 0804 Gross per 24 hour  Intake 270 ml  Output 2025 ml  Net -1755 ml   Filed Weights   03/06/22 0342 03/07/22 0500 03/08/22 0400  Weight: 105.9 kg 103.1 kg 93.7 kg    Exam: Awake Alert, Oriented *3, No new F.N deficits, Normal affect [place].AT,PERRAL Supple Neck,No JVD, No cervical lymphadenopathy appriciated.  Symmetrical Chest wall movement, Good air movement bilaterally, CTAB RRR,No Gallops,Rubs or new Murmurs, No Parasternal Heave +ve B.Sounds,  Abd Soft, Non tender, No organomegaly appriciated, No rebound -guarding or rigidity. No Cyanosis, Clubbing or edema, No new Rash or bruise   PERTINENT RADIOLOGIC STUDIES: DG Chest Port 1 View  Result Date: 03/08/2022 CLINICAL DATA:  Recent extubation. EXAM: PORTABLE CHEST 1 VIEW COMPARISON:  Chest radiograph March 07, 2022. FINDINGS: EKG leads project over the chest. Interval extubation and removal of the nasogastric tube. The heart size and mediastinal contours are unchanged. Improved aeration of the lungs with decreased interstitial opacities. Persistent obscuration of the left hemidiaphragm. Stable layering small left pleural effusion. The visualized skeletal structures are unchanged. IMPRESSION: 1. Interval extubation and removal of the nasogastric tube. 2. Improved aeration of the lungs with decreased interstitial and patchy opacities. 3. Stable layering small left pleural effusion. Electronically Signed   By: Dahlia Bailiff M.D.   On: 03/08/2022 08:33     PERTINENT LAB RESULTS: CBC: Recent Labs    03/08/22 0442 03/09/22 0450  WBC 11.7* 10.1  HGB 11.8* 11.4*  HCT 36.3* 34.2*  PLT 152 162   CMET CMP     Component Value Date/Time   NA 141 03/09/2022 0450   NA 141 04/22/2021 1243   K 3.9 03/09/2022 0450   CL 102 03/09/2022 0450   CO2 24 03/09/2022 0450   GLUCOSE 102 (H) 03/09/2022 0450   BUN 51 (H) 03/09/2022 0450   BUN 21 04/22/2021 1243   CREATININE 2.83 (H) 03/09/2022 0450   CREATININE 1.92 (H) 05/16/2021 1132   CALCIUM 8.6 (L) 03/09/2022 0450   PROT 7.1 03/06/2022 0224   PROT 7.0 09/29/2021 1024   ALBUMIN 3.0 (L) 03/06/2022 0224   ALBUMIN 4.0 09/29/2021 1024   AST 26 03/06/2022 0224   ALT 23 03/06/2022 0224   ALKPHOS 77 03/06/2022 0224   BILITOT 0.9 03/06/2022 0224   BILITOT 0.3 09/29/2021 1024   GFRNONAA 23 (L) 03/09/2022 0450   GFRNONAA 41 (L) 08/13/2018  1140   GFRAA 46 (L) 06/18/2019 1419   GFRAA 47 (L) 08/13/2018 1140    GFR Estimated Creatinine Clearance:  26.8 mL/min (A) (by C-G formula based on SCr of 2.83 mg/dL (H)). No results for input(s): "LIPASE", "AMYLASE" in the last 72 hours. No results for input(s): "CKTOTAL", "CKMB", "CKMBINDEX", "TROPONINI" in the last 72 hours. Invalid input(s): "POCBNP" No results for input(s): "DDIMER" in the last 72 hours. No results for input(s): "HGBA1C" in the last 72 hours. Recent Labs    03/07/22 0714  CHOL 169  HDL 34*  LDLCALC 115*  TRIG 101  100  CHOLHDL 5.0   No results for input(s): "TSH", "T4TOTAL", "T3FREE", "THYROIDAB" in the last 72 hours.  Invalid input(s): "FREET3" No results for input(s): "VITAMINB12", "FOLATE", "FERRITIN", "TIBC", "IRON", "RETICCTPCT" in the last 72 hours. Coags: No results for input(s): "INR" in the last 72 hours.  Invalid input(s): "PT" Microbiology: Recent Results (from the past 240 hour(s))  Blood culture (routine x 2)     Status: None (Preliminary result)   Collection Time: 03/06/22  3:30 AM   Specimen: BLOOD  Result Value Ref Range Status   Specimen Description BLOOD RIGHT ANTECUBITAL  Final   Special Requests   Final    BOTTLES DRAWN AEROBIC AND ANAEROBIC Blood Culture adequate volume   Culture   Final    NO GROWTH 2 DAYS Performed at [facility], [address] [place], [place] [address]    Report Status PENDING  Incomplete  Blood culture (routine x 2)     Status: None (Preliminary result)   Collection Time: 03/06/22  3:40 AM   Specimen: BLOOD RIGHT HAND  Result Value Ref Range Status   Specimen Description BLOOD RIGHT HAND  Final   Special Requests   Final    BOTTLES DRAWN AEROBIC AND ANAEROBIC Blood Culture adequate volume   Culture   Final    NO GROWTH 2 DAYS Performed at [facility], [place] [address] [place], [place] [address]    Report Status PENDING  Incomplete  Respiratory (~20 pathogens) panel by PCR     Status: None   Collection Time: 03/06/22  5:40 AM   Specimen: Nasopharyngeal Swab; Respiratory  Result Value Ref  Range Status   Adenovirus NOT DETECTED NOT DETECTED Final   Coronavirus 229E NOT DETECTED NOT DETECTED Final    Comment: (NOTE) The Coronavirus on the Respiratory Panel, DOES NOT test for the novel  Coronavirus (2019 nCoV)    Coronavirus HKU1 NOT DETECTED NOT DETECTED Final   Coronavirus NL63 NOT DETECTED NOT DETECTED Final   Coronavirus OC43 NOT DETECTED NOT DETECTED Final   Metapneumovirus NOT DETECTED NOT DETECTED Final   Rhinovirus / Enterovirus NOT DETECTED NOT DETECTED Final   Influenza A NOT DETECTED NOT DETECTED Final   Influenza B NOT DETECTED NOT DETECTED Final   Parainfluenza Virus 1 NOT DETECTED NOT DETECTED Final   Parainfluenza Virus 2 NOT DETECTED NOT DETECTED Final   Parainfluenza Virus 3 NOT DETECTED NOT DETECTED Final   Parainfluenza Virus 4 NOT DETECTED NOT DETECTED Final   Respiratory Syncytial Virus NOT DETECTED NOT DETECTED Final   Bordetella pertussis NOT DETECTED NOT DETECTED Final   Bordetella Parapertussis NOT DETECTED NOT DETECTED Final   Chlamydophila pneumoniae NOT DETECTED NOT DETECTED Final   Mycoplasma pneumoniae NOT DETECTED NOT DETECTED Final    Comment: Performed at [facility], [place]. [address], [place], [place] [address]  MRSA Next Gen by PCR, Nasal  Status: None   Collection Time: 03/06/22  6:54 AM  Result Value Ref Range Status   MRSA by PCR Next Gen NOT DETECTED NOT DETECTED Final    Comment: (NOTE) The GeneXpert MRSA Assay (FDA approved for NASAL specimens only), is one component of a comprehensive MRSA colonization surveillance program. It is not intended to diagnose MRSA infection nor to guide or monitor treatment for MRSA infections. Test performance is not FDA approved in patients less than 74 years old. Performed at [facility], [place] [address], [place], [place] [address]   Culture, Respiratory w Gram Stain     Status: None   Collection Time: 03/06/22  7:53 AM   Specimen: Tracheal Aspirate; Respiratory  Result Value  Ref Range Status   Specimen Description TRACHEAL ASPIRATE  Final   Special Requests NONE  Final   Gram Stain   Final    MODERATE WBC PRESENT,BOTH PMN AND MONONUCLEAR MODERATE ABUNDANT GRAM POSITIVE COCCI ABUNDANT GRAM POSITIVE RODS ABUNDANT GRAM NEGATIVE RODS    Culture   Final    MODERATE Normal respiratory flora-no Staph aureus or Pseudomonas seen Performed at [facility], [place] [address] [place], [place] [address]    Report Status 03/08/2022 FINAL  Final    FURTHER DISCHARGE INSTRUCTIONS:  Get Medicines reviewed and adjusted: Please take all your medications with you for your next visit with your Primary MD  Laboratory/radiological data: Please request your Primary MD to go over all hospital tests and procedure/radiological results at the follow up, please ask your Primary MD to get all Hospital records sent to his/her office.  In some cases, they will be blood work, cultures and biopsy results pending at the time of your discharge. Please request that your primary care M.D. goes through all the records of your hospital data and follows up on these results.  Also Note the following: If you experience worsening of your admission symptoms, develop shortness of breath, life threatening emergency, suicidal or homicidal thoughts you must seek medical attention immediately by calling 911 or calling your MD immediately  if symptoms less severe.  You must read complete instructions/literature along with all the possible adverse reactions/side effects for all the Medicines you take and that have been prescribed to you. Take any new Medicines after you have completely understood and accpet all the possible adverse reactions/side effects.   Do not drive when taking Pain medications or sleeping medications (Benzodaizepines)  Do not take more than prescribed Pain, Sleep and Anxiety Medications. It is not advisable to combine anxiety,sleep and pain medications without talking with your  primary care practitioner  Special Instructions: If you have smoked or chewed Tobacco  in the last 2 yrs please stop smoking, stop any regular Alcohol  and or any Recreational drug use.  Wear Seat belts while driving.  Please note: You were cared for by a hospitalist during your hospital stay. Once you are discharged, your primary care physician will handle any further medical issues. Please note that NO REFILLS for any discharge medications will be authorized once you are discharged, as it is imperative that you return to your primary care physician (or establish a relationship with a primary care physician if you do not have one) for your post hospital discharge needs so that they can reassess your need for medications and monitor your lab values.  Total Time spent coordinating discharge including counseling, education and face to face time equals greater than 30 minutes.  Signed: Oren Binet 03/09/2022  9:11 AM

## 2022-03-09 NOTE — Progress Notes (Signed)
Occupational Therapy Treatment Patient Details Name: Caleb Cuevas MRN: [id number] DOB: 1946/01/17 Today's Date: 03/09/2022   History of present illness 76 yo male presents to [facility] on 7/31 with respiratory distress, RUE weakness. 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 ETT 7/31-8/1. recent admission 7/20 for HF. PMHx significant for DMII, HTN, HLD, PAD, afib, Hx of TIA and cerebral aneurysm.   OT comments  Patient reports feeling close to his baseline. Today demonstrates ability to transfer to recliner, then to [facility] placed in front of sink for seated Adl task. Patient reports he typically has seats "all over" to perform tasks and gardening. He is a high fall risk with Les that appear to want to buckle - but patient reports this is normal. Continue to recommend [facility] OT.    Recommendations for follow up therapy are one component of a multi-disciplinary discharge planning process, led by the attending physician.  Recommendations may be updated based on patient status, additional functional criteria and insurance authorization.    Follow Up Recommendations  Home health OT    Assistance Recommended at Discharge Frequent or constant Supervision/Assistance  Patient can return home with the following  A little help with walking and/or transfers;A lot of help with bathing/dressing/bathroom;Assistance with cooking/housework;Direct supervision/assist for financial management;Assist for transportation;Help with stairs or ramp for entrance   Equipment Recommendations  BSC/3in1    Recommendations for Other Services      Precautions / Restrictions Precautions Precautions: Fall Restrictions Weight Bearing Restrictions: No       Mobility Bed Mobility                    Transfers                          Balance Overall balance assessment: Needs assistance Sitting-balance support: No upper extremity supported, Feet supported Sitting balance-Leahy Scale: Fair     Standing balance support: Bilateral upper extremity supported, During functional activity Standing balance-Leahy Scale: Poor                             ADL either performed or assessed with clinical judgement   ADL Overall ADL's : Needs assistance/impaired     Grooming: Oral care;Wash/dry face;Wash/dry hands Grooming Details (indicate cue type and reason): sat at sink to perform grooming tasks.             Lower Body Dressing: Supervision/safety Lower Body Dressing Details (indicate cue type and reason): supervision to don socks Toilet Transfer: Minimal assistance;Ambulation;Rolling walker (2 wheels) Toilet Transfer Details (indicate cue type and reason): Performed transfer onto [facility] in order to sit in front of sink         Functional mobility during ADLs: Minimal assistance General ADL Comments: Patient Mod I for bed mobility. Patient min assist for standing with walker and taking steps. Patient reports he can't stand for long as legs give out - and they did appear to want to buckle. Transferred to recliner then repositioned chair and BSC for safety. patinet able to take steps with walker and hand holds to get up and down from [facility].    Extremity/Trunk Assessment Upper Extremity Assessment RUE Deficits / Details: longstanding fine motor deficits RUE Coordination: decreased fine motor LUE Deficits / Details: hx  of CVA with mild residual deficits LUE Coordination: decreased fine motor;decreased gross motor       Cervical / Trunk Assessment Cervical / Trunk Assessment: Normal    Vision Baseline Vision/History: 1 Wears glasses     Perception     Praxis      Cognition Arousal/Alertness: Awake/alert Behavior During Therapy: WFL for tasks assessed/performed Overall Cognitive Status: Within  Functional Limits for tasks assessed                                 General Comments: some decreased medical literacy        Exercises      Shoulder Instructions       General Comments      Pertinent Vitals/ Pain       Pain Assessment Pain Assessment: No/denies pain  Home Living                                          Prior Functioning/Environment              Frequency  Min 2X/week        Progress Toward Goals  OT Goals(current goals can now be found in the care plan section)  Progress towards OT goals: Progressing toward goals  Acute Rehab OT Goals OT Goal Formulation: With patient Time For Goal Achievement: 03/22/22 Potential to Achieve Goals: Good  Plan Discharge plan remains appropriate    Co-evaluation                 AM-PAC OT "6 Clicks" Daily Activity     Outcome Measure   Help from another person eating meals?: None Help from another person taking care of personal grooming?: A Little Help from another person toileting, which includes using toliet, bedpan, or urinal?: A Little Help from another person bathing (including washing, rinsing, drying)?: A Lot Help from another person to put on and taking off regular upper body clothing?: A Lot Help from another person to put on and taking off regular lower body clothing?: A Lot 6 Click Score: 16    End of Cuevas Equipment Utilized During Treatment: Rolling walker (2 wheels);Gait belt;Oxygen  OT Visit Diagnosis: Unsteadiness on feet (R26.81);Other abnormalities of gait and mobility (R26.89);Muscle weakness (generalized) (M62.81);Hemiplegia and hemiparesis Hemiplegia - Right/Left: Left Hemiplegia - caused by: Cerebral infarction   Activity Tolerance Patient tolerated treatment well   Patient Left in bed;with call bell/phone within reach;with bed alarm set;with family/visitor present   Nurse Communication          Time: 9379-0240 OT Time Calculation  (min): 19 min  Charges: OT General Charges $OT Visit: 1 Visit OT Treatments $Self Care/Home Management : 8-22 mins  Caleb Cuevas, OTR/L Acute Care Rehab Services  Office [phone] Pager: [phone]   Caleb Cuevas 03/09/2022, 11:58 AM

## 2022-03-11 LAB — CULTURE, BLOOD (ROUTINE X 2)
Culture: NO GROWTH
Culture: NO GROWTH
Special Requests: ADEQUATE
Special Requests: ADEQUATE

## 2022-03-13 ENCOUNTER — Other Ambulatory Visit: Payer: Self-pay

## 2022-03-13 NOTE — Patient Outreach (Signed)
[facility]) Care Management  03/13/2022  Caleb Cuevas 1946-05-03 [id number]    EMMI-STROKE RED ON EMMI ALERT Day # 1 Date: 03/11/2022 Red Alert Reason: 'Scheduled follow up appt? No"   Outreach attempt # 1 to patient. Spoke with patient who states things going fairly well. Denies any acute issues or concerns at present. He gave permission to speak with his caregiver as well. She confirms that she [facility]. She drives and accompanies patient to all appts. She has picked up all his meds and no issues or concerns regarding them. Caregiver will follow up with [facility] to see if patient got [facility] for [facility] services.    Advised patient /caregiver that they would continue to get automated EMMI-Stroke post discharge calls to assess how they are doing following recent hospitalization and will receive a call from a nurse if any of their responses were abnormal. Patient voiced understanding and was appreciative of f/u call.    Plan: RN CM will close case.  Antionette Fairy, RN,BSN,CCM [facility] Care Management Telephonic Care Management Coordinator Direct Phone: [phone] Toll Free: [phone] Fax: [phone]

## 2022-11-30 IMAGING — CT CT ANGIO CHEST
2 of 6 series · 18 of 36 positions shown · IV contrast (omnipaque)
Comparison: Chest radiograph December 24, 2017

CLINICAL DATA: Shortness of breath

EXAM:
CT ANGIOGRAPHY CHEST WITH CONTRAST
TECHNIQUE: Multidetector CT imaging of the chest was performed using the
standard protocol during bolus administration of intravenous
contrast. Multiplanar CT image reconstructions and MIPs were
obtained to evaluate the vascular anatomy.
CONTRAST:  50mL OMNIPAQUE IOHEXOL 350 MG/ML SOLN

[Series 7: pe thins · axial · 0.98mm/px · z∈[-534,-242]mm · 17 of 465 slices shown]
[im 24/465  lung]
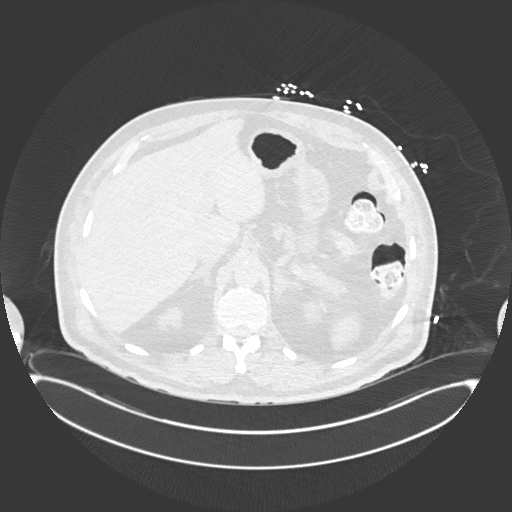
[im 47/465  mediastinal]
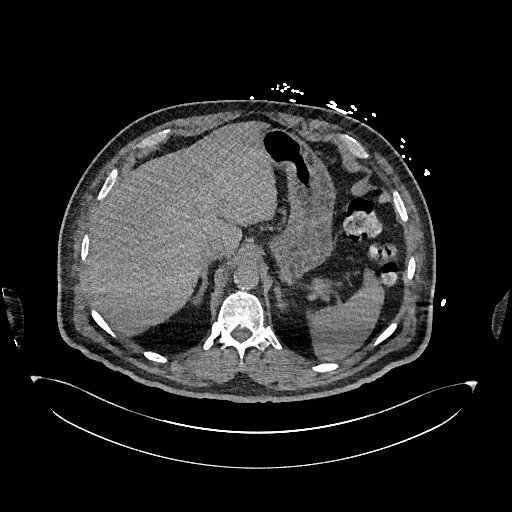
[im 70/465  lung]
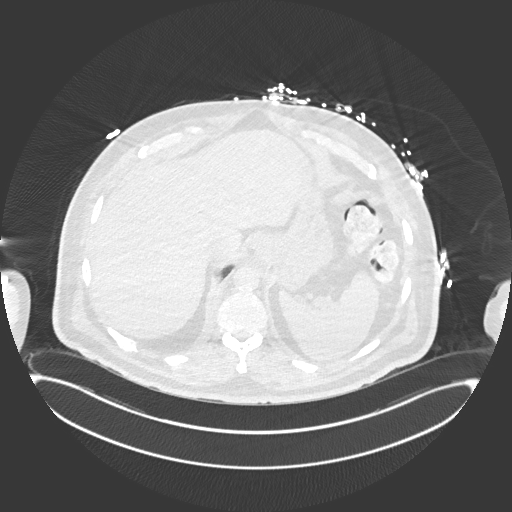
[im 93/465  mediastinal]
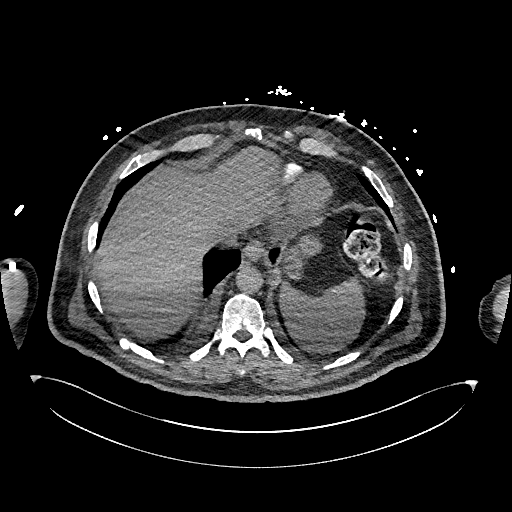
[im 140/465  lung]
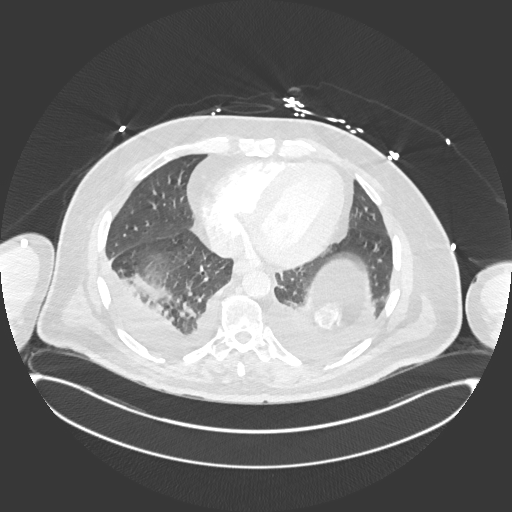
[im 163/465  mediastinal]
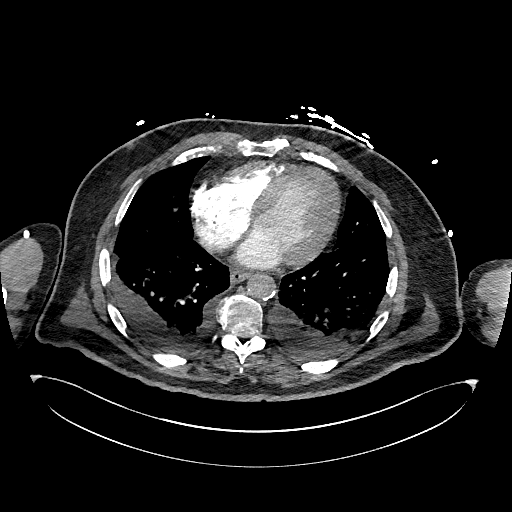
[im 186/465  lung]
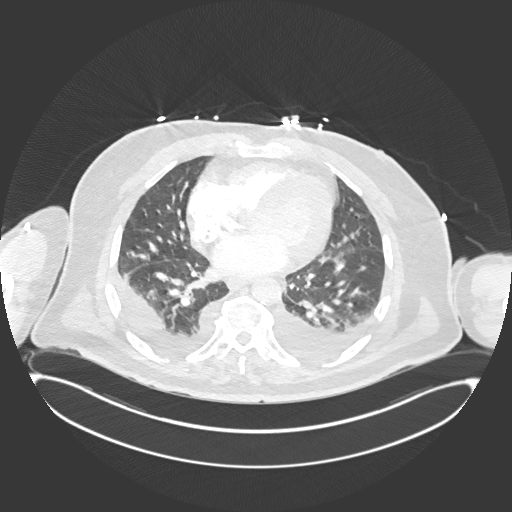
[im 209/465  mediastinal]
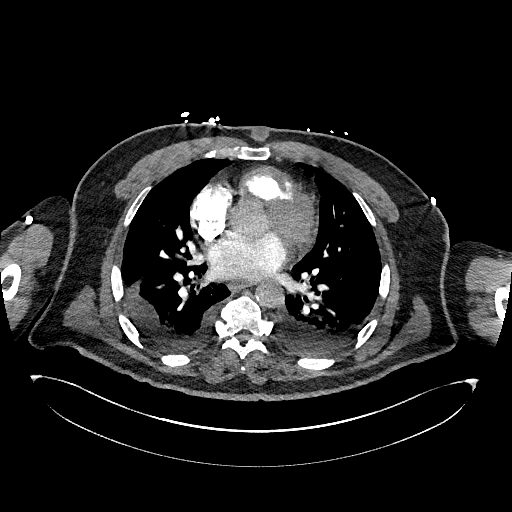
[im 233/465  lung]
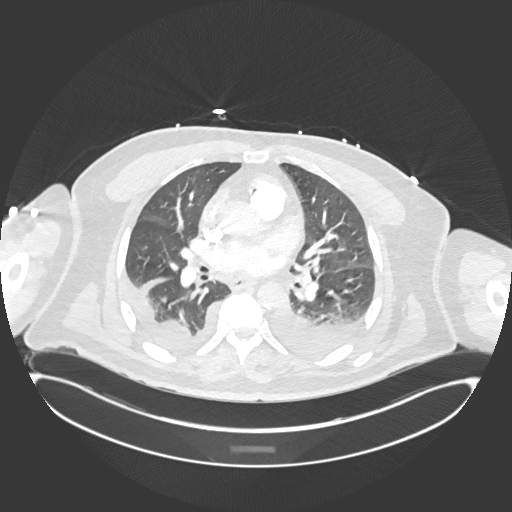
[im 256/465  mediastinal]
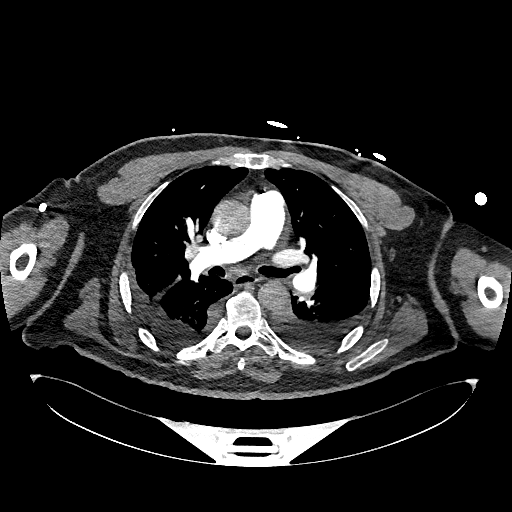
[im 279/465  lung]
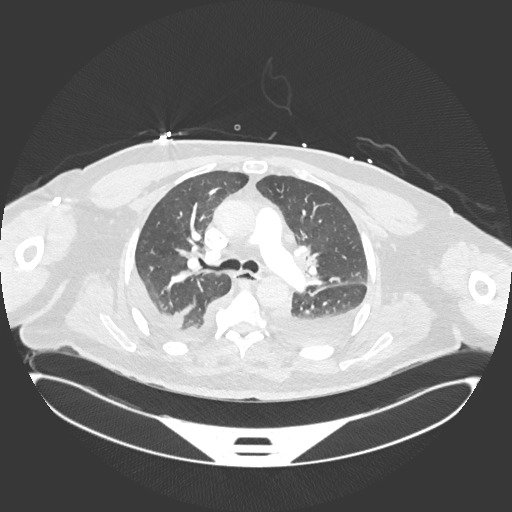
[im 302/465  mediastinal]
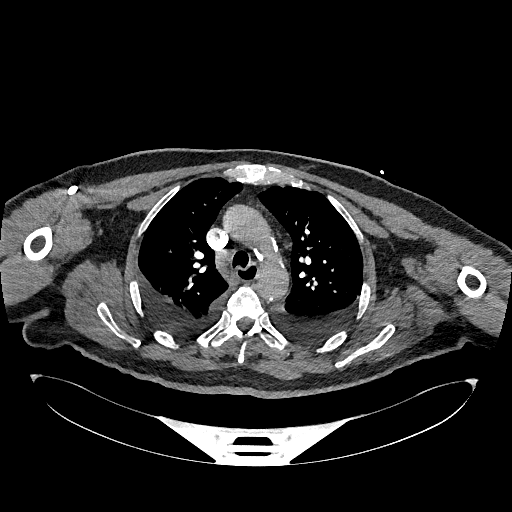
[im 325/465  lung]
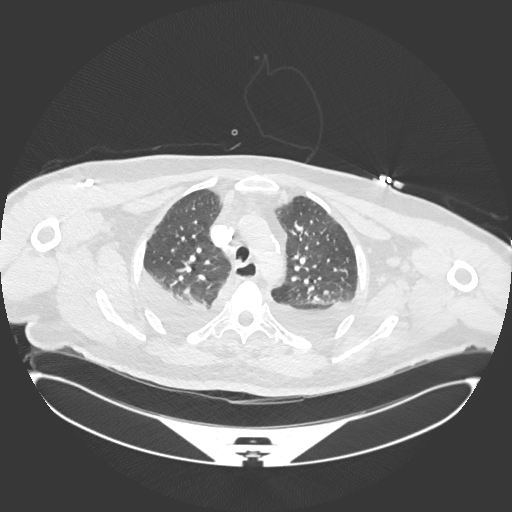
[im 372/465  mediastinal]
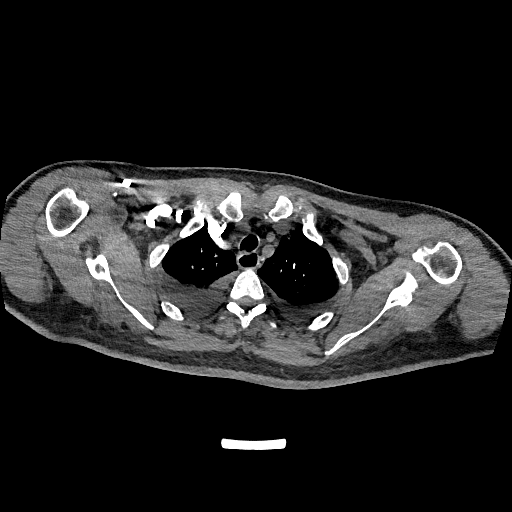
[im 395/465  lung]
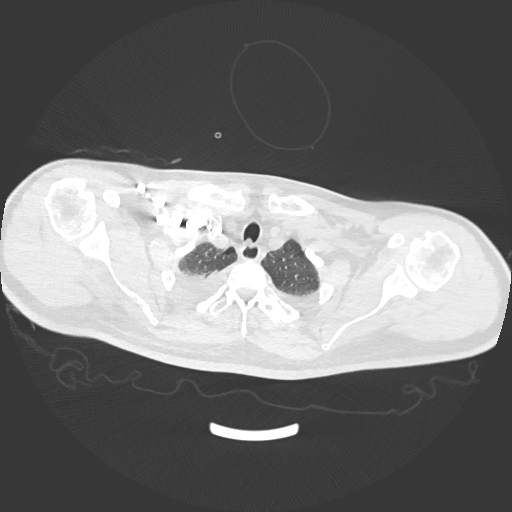
[im 418/465  mediastinal]
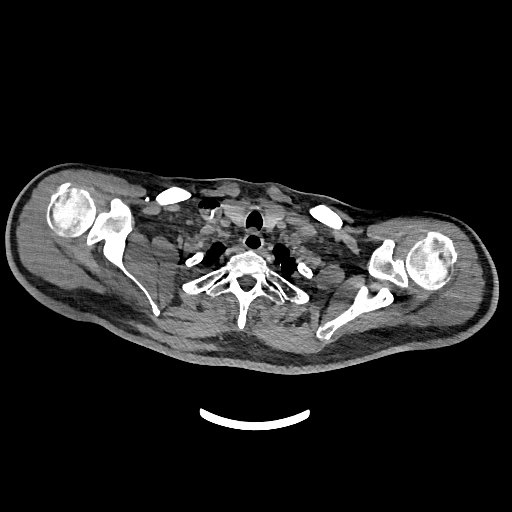
[im 441/465  lung]
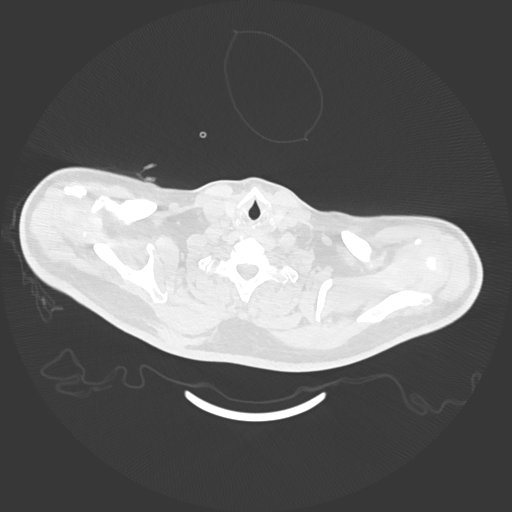

[Series 8: pe 2mm cor · coronal · 0.64mm/px · 1 of 151 slices shown]
[im 76/151  mediastinal]
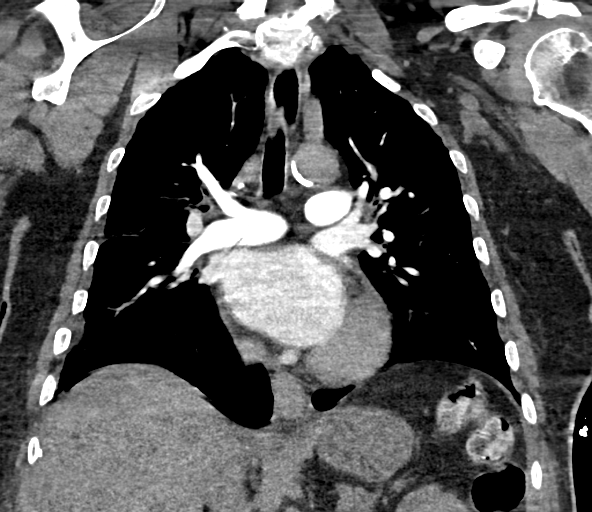

[18 of 36 positions shown; findings below may reference images not displayed]

FINDINGS: Cardiovascular: Satisfactory opacification of the pulmonary arteries
to the segmental level. No evidence of pulmonary embolism. Gas in
the pulmonary outflow tract related to vascular access. Aortic
atherosclerosis without aneurysmal dilation. Mild cardiac
enlargement. No pericardial significant effusion/thickening. No
suspicious intracardiac filling defect.

Mediastinum/Nodes: No discrete thyroid nodule. No pathologically
enlarged mediastinal, hilar or axillary lymph nodes. The trachea
esophagus are grossly unremarkable.

Lungs/Pleura: Layering small bilateral pleural effusions with
adjacent airspace consolidations/atelectasis. Mild diffuse
interstitial thickening with adjacent ground-glass opacities.

Upper Abdomen: Peripherally calcified splenic hypodensity measuring
2.7 cm, incompletely evaluated but favored sequela prior trauma.
Thickening of adrenal glands without discrete nodularity, favor
hyperplasia. Hypodense 5 mm focus in the upper pole the right kidney
which may represent a proteinaceous/hemorrhagic cyst but is
incompletely evaluated on this study.

Musculoskeletal: Multilevel degenerative changes spine. Degenerative
changes of the bilateral shoulders. No acute osseous.

Review of the MIP images confirms the above findings.
IMPRESSION: 1. No evidence of pulmonary embolism.
2. Findings suggestive of congestive heart failure with
cardiomegaly, pulmonary edema and small layering bilateral pleural
effusions with adjacent atelectasis/consolidations.
3. Peripherally calcified splenic hypodensity measuring 2.7 cm,
incompletely evaluated but favored sequela prior trauma.
4. Hyperdense 5 mm focus in the upper pole the right kidney which
may represent a proteinaceous/hemorrhagic cyst but is incompletely
evaluated on this single-phase study. Consider further evaluation
with renal protocol MRI CT with and without contrast in 6 months for
further characterization assessment of stability.
5. Aortic Atherosclerosis ([id number]).

## 2023-05-13 IMAGING — US US RENAL
1 series · 14 of 24 positions shown · non-contrast
Comparison: Included portions from chest CT 02/13/2021, no
dedicated abdominal imaging available.

CLINICAL DATA: Stage III B chronic kidney disease.

EXAM:
RENAL / URINARY TRACT ULTRASOUND COMPLETE

[Series 1: us renal · 0.26mm/px · 14 of 24 slices shown]
[im 1/24]
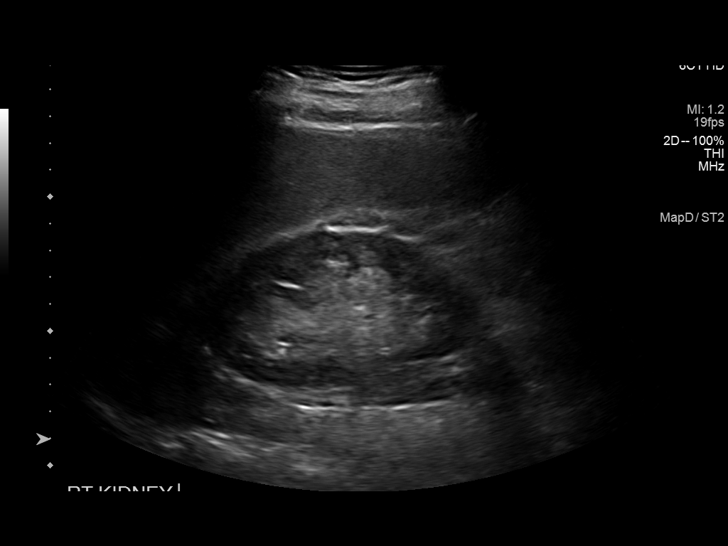
[im 3/24]
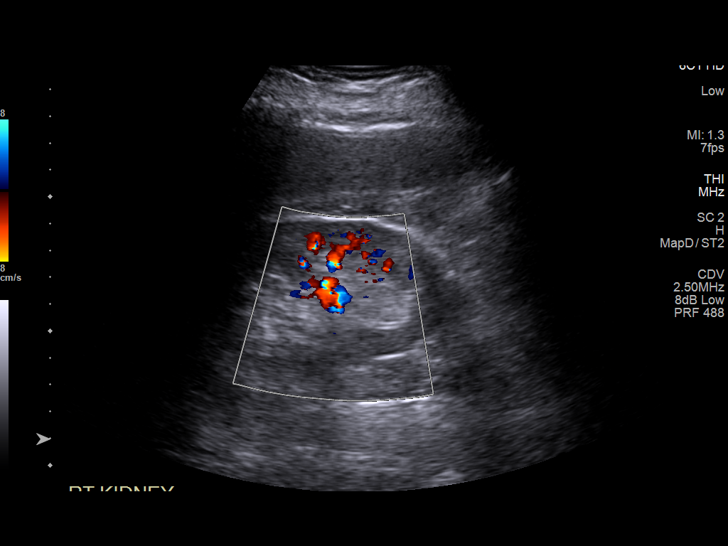
[im 5/24]
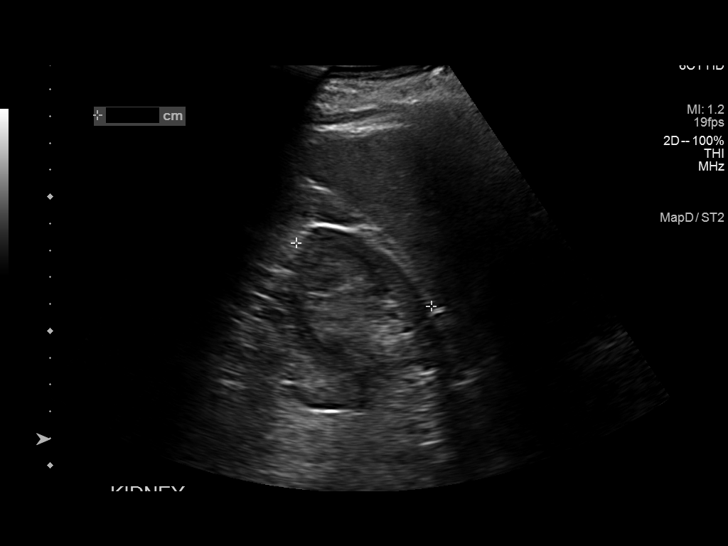
[im 7/24]
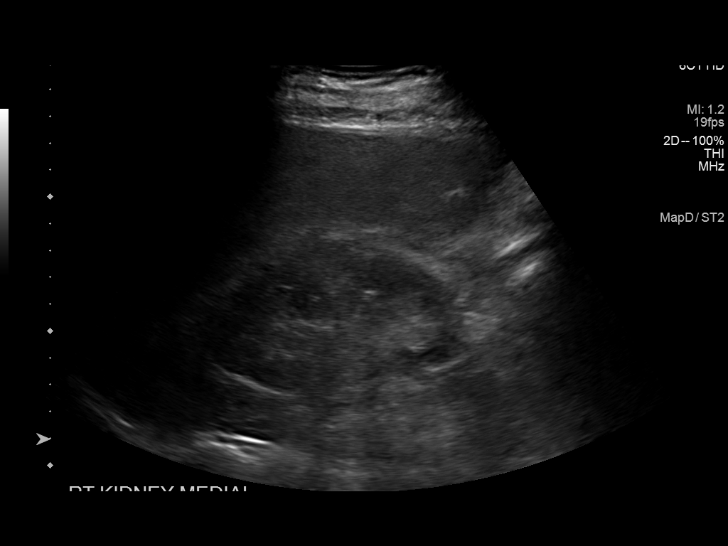
[im 8/24]
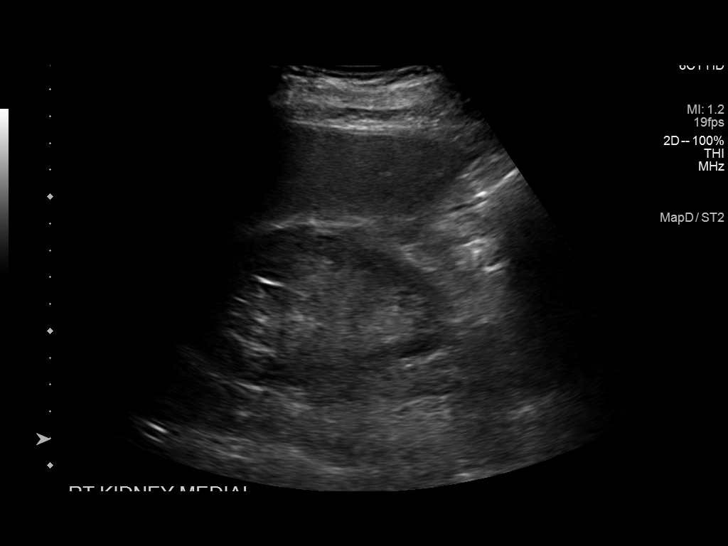
[im 10/24]
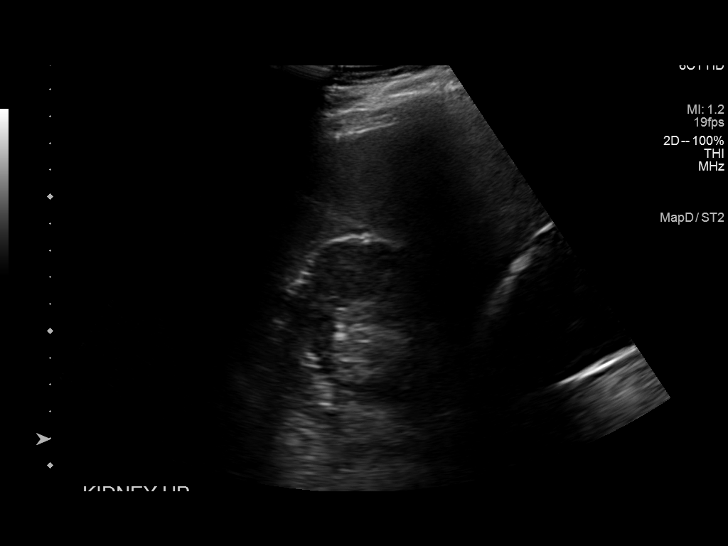
[im 12/24]
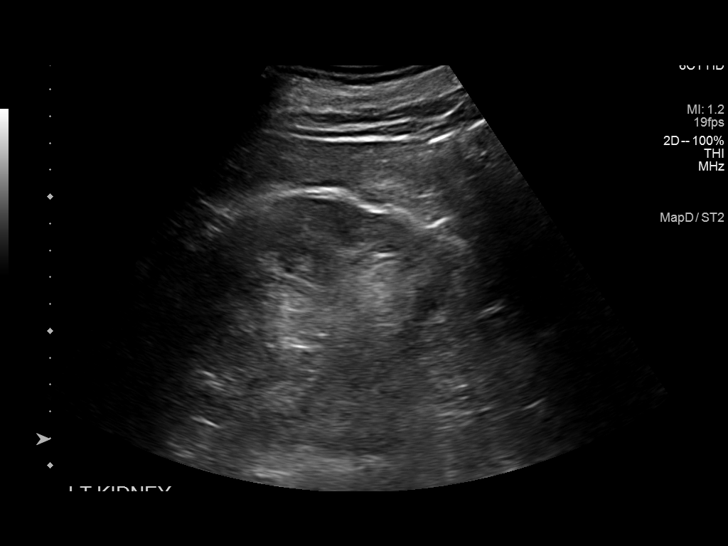
[im 13/24]
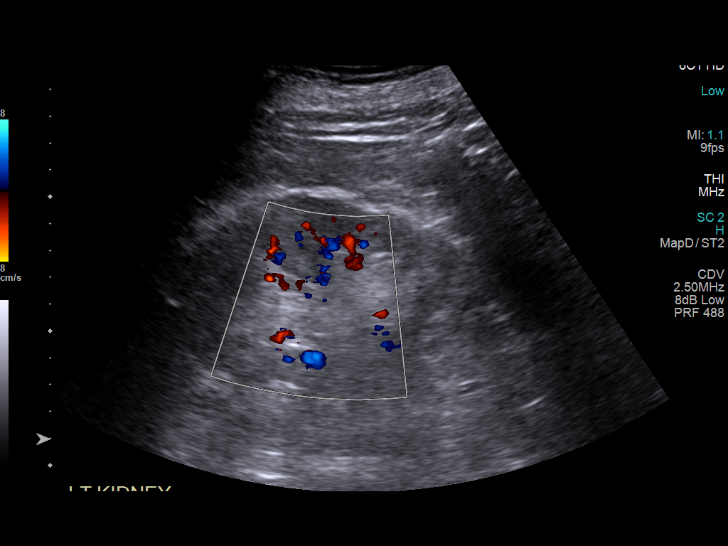
[im 15/24]
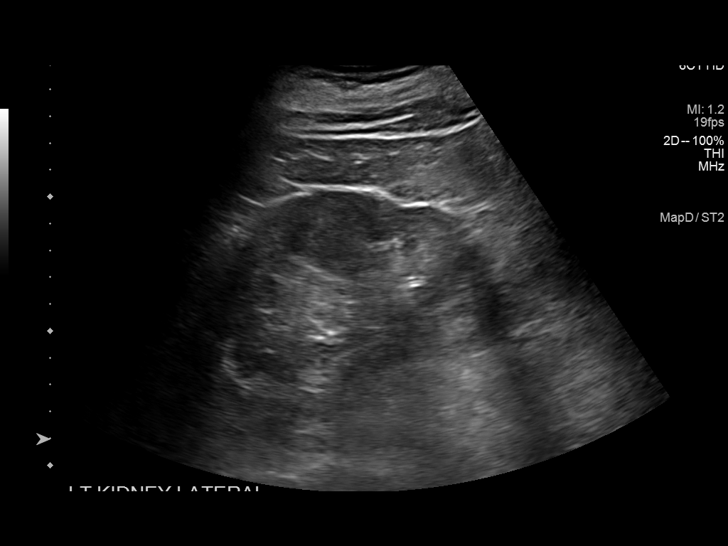
[im 17/24]
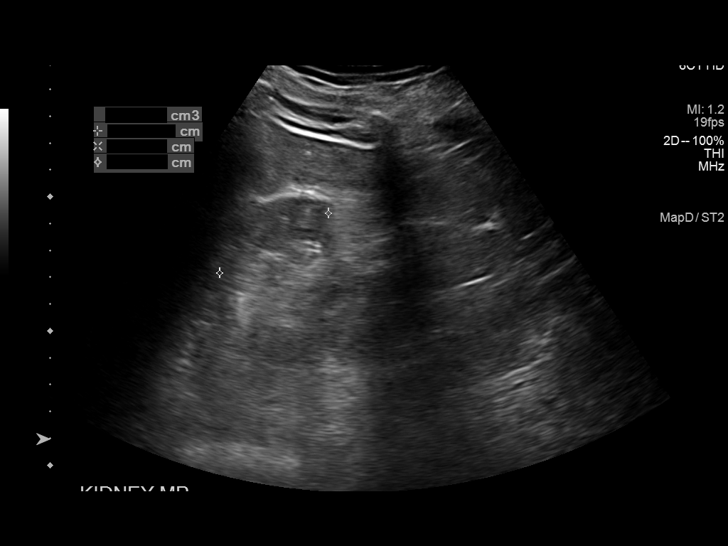
[im 19/24]
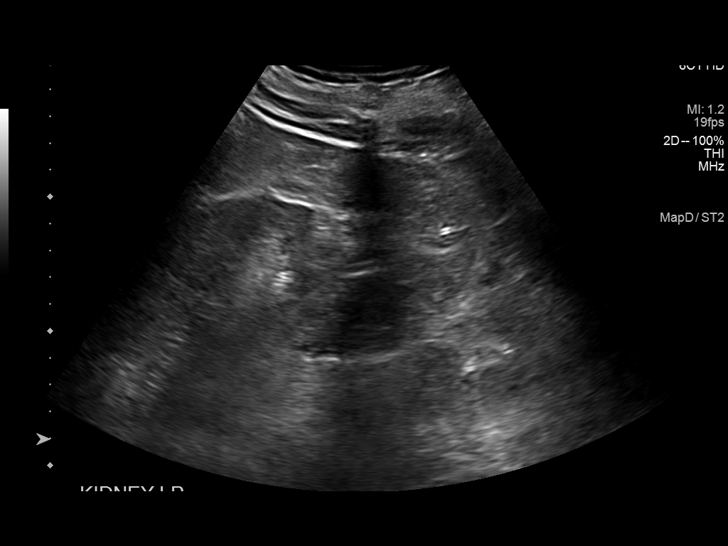
[im 20/24]
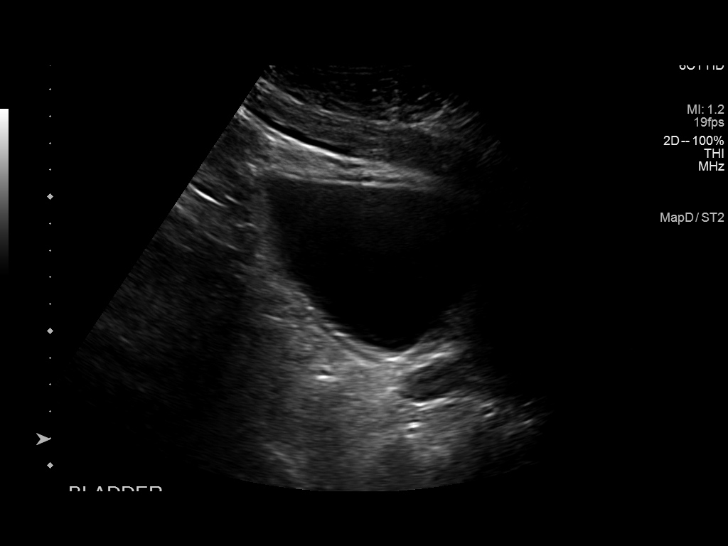
[im 22/24]
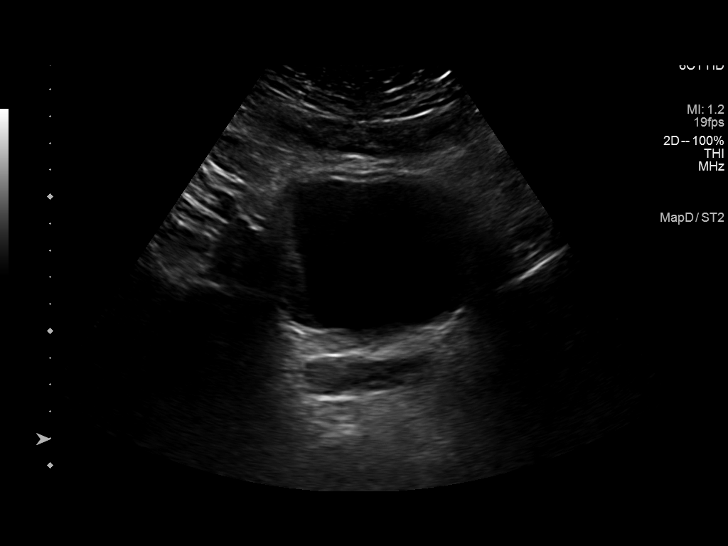
[im 24/24]
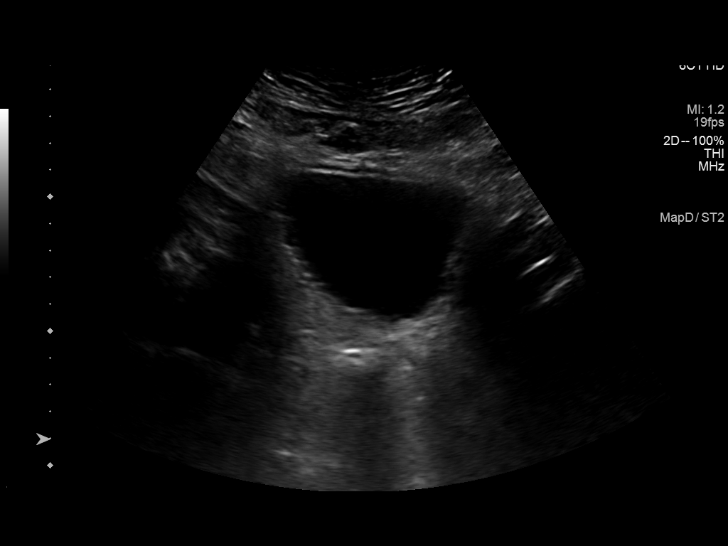

[14 of 24 positions shown; findings below may reference images not displayed]

FINDINGS: Right Kidney:

Renal measurements: 10.8 x 5.6 x 5.6 cm = volume: 176 mL. Normal
parenchymal echogenicity. No hydronephrosis. No visualized stone.
The subcentimeter hyperdense focus in the upper kidney on prior CT
is not seen by ultrasound. No focal lesion is visualized.

Left Kidney:

Renal measurements: 10.3 x 6.6 x 4.6 cm = volume: 164 mL. Normal
parenchymal echogenicity. No hydronephrosis. No visualized stone or
focal lesion.

Bladder:

Appears normal for degree of bladder distention.

Other:

None.
IMPRESSION: Unremarkable sonographic appearance of the kidneys and bladder.
